# Patient Record
Sex: Male | Born: 1940 | Race: White | Hispanic: No
Health system: Midwestern US, Community
[De-identification: ages and names within clinical notes are randomized; demographics above are authoritative.]

## PROBLEM LIST (undated history)

## (undated) DIAGNOSIS — D649 Anemia, unspecified: Secondary | ICD-10-CM

## (undated) DIAGNOSIS — N521 Erectile dysfunction due to diseases classified elsewhere: Secondary | ICD-10-CM

## (undated) DIAGNOSIS — E785 Hyperlipidemia, unspecified: Principal | ICD-10-CM

## (undated) DIAGNOSIS — E291 Testicular hypofunction: Secondary | ICD-10-CM

## (undated) DIAGNOSIS — C61 Malignant neoplasm of prostate: Principal | ICD-10-CM

## (undated) DIAGNOSIS — N401 Enlarged prostate with lower urinary tract symptoms: Secondary | ICD-10-CM

## (undated) DIAGNOSIS — N138 Other obstructive and reflux uropathy: Secondary | ICD-10-CM

## (undated) DIAGNOSIS — I10 Essential (primary) hypertension: Secondary | ICD-10-CM

## (undated) DIAGNOSIS — N644 Mastodynia: Secondary | ICD-10-CM

## (undated) DIAGNOSIS — E538 Deficiency of other specified B group vitamins: Principal | ICD-10-CM

## (undated) DIAGNOSIS — D509 Iron deficiency anemia, unspecified: Principal | ICD-10-CM

## (undated) DIAGNOSIS — E7849 Other hyperlipidemia: Secondary | ICD-10-CM

## (undated) DIAGNOSIS — R7989 Other specified abnormal findings of blood chemistry: Secondary | ICD-10-CM

## (undated) DIAGNOSIS — L989 Disorder of the skin and subcutaneous tissue, unspecified: Principal | ICD-10-CM

## (undated) DIAGNOSIS — D696 Thrombocytopenia, unspecified: Secondary | ICD-10-CM

## (undated) DIAGNOSIS — R9430 Abnormal result of cardiovascular function study, unspecified: Secondary | ICD-10-CM

## (undated) DIAGNOSIS — N529 Male erectile dysfunction, unspecified: Secondary | ICD-10-CM

## (undated) DIAGNOSIS — E7800 Pure hypercholesterolemia, unspecified: Secondary | ICD-10-CM

## (undated) DIAGNOSIS — I493 Ventricular premature depolarization: Secondary | ICD-10-CM

## (undated) HISTORY — PX: BIOPSY PROSTATE: PRO28

## (undated) HISTORY — PX: CHOLECYSTECTOMY: SHX55

## (undated) HISTORY — DX: Malignant neoplasm of prostate: C61

## (undated) HISTORY — PX: ABDOMINAL SURGERY: SHX537

## (undated) HISTORY — PX: APPENDECTOMY: SHX54

## (undated) HISTORY — DX: Ventricular premature depolarization: I49.3

---

## 2014-08-07 DIAGNOSIS — Z23 Encounter for immunization: Secondary | ICD-10-CM | POA: Diagnosis not present

## 2014-08-14 DIAGNOSIS — E785 Hyperlipidemia, unspecified: Secondary | ICD-10-CM | POA: Diagnosis not present

## 2014-08-14 DIAGNOSIS — I493 Ventricular premature depolarization: Secondary | ICD-10-CM | POA: Diagnosis not present

## 2014-08-14 DIAGNOSIS — R9439 Abnormal result of other cardiovascular function study: Secondary | ICD-10-CM | POA: Diagnosis not present

## 2014-08-22 DIAGNOSIS — R9439 Abnormal result of other cardiovascular function study: Secondary | ICD-10-CM | POA: Diagnosis not present

## 2014-08-22 DIAGNOSIS — I493 Ventricular premature depolarization: Secondary | ICD-10-CM | POA: Diagnosis not present

## 2014-08-22 DIAGNOSIS — E785 Hyperlipidemia, unspecified: Secondary | ICD-10-CM | POA: Diagnosis not present

## 2014-09-24 DIAGNOSIS — J0140 Acute pansinusitis, unspecified: Secondary | ICD-10-CM | POA: Diagnosis not present

## 2014-10-10 DIAGNOSIS — Z125 Encounter for screening for malignant neoplasm of prostate: Secondary | ICD-10-CM | POA: Diagnosis not present

## 2014-10-10 DIAGNOSIS — N529 Male erectile dysfunction, unspecified: Secondary | ICD-10-CM | POA: Diagnosis not present

## 2014-10-10 DIAGNOSIS — N528 Other male erectile dysfunction: Secondary | ICD-10-CM | POA: Diagnosis not present

## 2014-10-10 DIAGNOSIS — N401 Enlarged prostate with lower urinary tract symptoms: Secondary | ICD-10-CM | POA: Diagnosis not present

## 2015-02-10 DIAGNOSIS — I493 Ventricular premature depolarization: Secondary | ICD-10-CM | POA: Diagnosis not present

## 2015-02-10 DIAGNOSIS — E785 Hyperlipidemia, unspecified: Secondary | ICD-10-CM | POA: Diagnosis not present

## 2015-02-10 DIAGNOSIS — R9439 Abnormal result of other cardiovascular function study: Secondary | ICD-10-CM | POA: Diagnosis not present

## 2015-03-03 DIAGNOSIS — D225 Melanocytic nevi of trunk: Secondary | ICD-10-CM | POA: Diagnosis not present

## 2015-03-03 DIAGNOSIS — L570 Actinic keratosis: Secondary | ICD-10-CM | POA: Diagnosis not present

## 2015-03-03 DIAGNOSIS — D2261 Melanocytic nevi of right upper limb, including shoulder: Secondary | ICD-10-CM | POA: Diagnosis not present

## 2015-04-13 DIAGNOSIS — J320 Chronic maxillary sinusitis: Secondary | ICD-10-CM | POA: Diagnosis not present

## 2015-04-13 DIAGNOSIS — J321 Chronic frontal sinusitis: Secondary | ICD-10-CM | POA: Diagnosis not present

## 2015-05-29 DIAGNOSIS — E785 Hyperlipidemia, unspecified: Secondary | ICD-10-CM | POA: Diagnosis not present

## 2015-05-29 DIAGNOSIS — I493 Ventricular premature depolarization: Secondary | ICD-10-CM | POA: Diagnosis not present

## 2015-08-13 DIAGNOSIS — Z23 Encounter for immunization: Secondary | ICD-10-CM | POA: Diagnosis not present

## 2015-09-03 DIAGNOSIS — D235 Other benign neoplasm of skin of trunk: Secondary | ICD-10-CM | POA: Diagnosis not present

## 2015-09-03 DIAGNOSIS — D485 Neoplasm of uncertain behavior of skin: Secondary | ICD-10-CM | POA: Diagnosis not present

## 2015-09-22 DIAGNOSIS — L821 Other seborrheic keratosis: Secondary | ICD-10-CM | POA: Diagnosis not present

## 2015-09-22 DIAGNOSIS — D2311 Other benign neoplasm of skin of right eyelid, including canthus: Secondary | ICD-10-CM | POA: Diagnosis not present

## 2015-09-22 DIAGNOSIS — Z85828 Personal history of other malignant neoplasm of skin: Secondary | ICD-10-CM | POA: Diagnosis not present

## 2015-09-22 DIAGNOSIS — L814 Other melanin hyperpigmentation: Secondary | ICD-10-CM | POA: Diagnosis not present

## 2015-09-22 DIAGNOSIS — D485 Neoplasm of uncertain behavior of skin: Secondary | ICD-10-CM | POA: Diagnosis not present

## 2015-09-23 DIAGNOSIS — R972 Elevated prostate specific antigen [PSA]: Secondary | ICD-10-CM | POA: Diagnosis not present

## 2015-09-23 DIAGNOSIS — R3916 Straining to void: Secondary | ICD-10-CM | POA: Diagnosis not present

## 2015-09-23 DIAGNOSIS — N401 Enlarged prostate with lower urinary tract symptoms: Secondary | ICD-10-CM | POA: Diagnosis not present

## 2015-09-23 DIAGNOSIS — N402 Nodular prostate without lower urinary tract symptoms: Secondary | ICD-10-CM | POA: Diagnosis not present

## 2015-09-24 LAB — PSA PROSTATIC SPECIFIC ANTIGEN: PSA: 5.04 ng/mL

## 2015-11-11 DIAGNOSIS — N402 Nodular prostate without lower urinary tract symptoms: Secondary | ICD-10-CM | POA: Diagnosis not present

## 2015-11-11 DIAGNOSIS — R972 Elevated prostate specific antigen [PSA]: Secondary | ICD-10-CM | POA: Diagnosis not present

## 2015-11-27 DIAGNOSIS — H04123 Dry eye syndrome of bilateral lacrimal glands: Secondary | ICD-10-CM | POA: Diagnosis not present

## 2016-01-05 DIAGNOSIS — S86912A Strain of unspecified muscle(s) and tendon(s) at lower leg level, left leg, initial encounter: Secondary | ICD-10-CM | POA: Diagnosis not present

## 2016-01-05 DIAGNOSIS — M179 Osteoarthritis of knee, unspecified: Secondary | ICD-10-CM | POA: Diagnosis not present

## 2016-02-15 ENCOUNTER — Encounter: Payer: Self-pay | Admitting: Cardiology

## 2016-02-15 ENCOUNTER — Ambulatory Visit (INDEPENDENT_AMBULATORY_CARE_PROVIDER_SITE_OTHER): Payer: Medicare Other | Admitting: Cardiology

## 2016-02-15 VITALS — BP 130/78 | HR 64 | Ht 68.0 in | Wt 176.1 lb

## 2016-02-15 DIAGNOSIS — E785 Hyperlipidemia, unspecified: Secondary | ICD-10-CM

## 2016-02-15 DIAGNOSIS — R079 Chest pain, unspecified: Secondary | ICD-10-CM | POA: Diagnosis not present

## 2016-02-15 LAB — CBC WITH DIFFERENTIAL/PLATELET
BASOS ABS: 63 {cells}/uL (ref 0–200)
Basophils Relative: 1 %
EOS PCT: 3 %
Eosinophils Absolute: 189 cells/uL (ref 15–500)
HEMATOCRIT: 42 % (ref 38.5–50.0)
HEMOGLOBIN: 14.1 g/dL (ref 13.2–17.1)
LYMPHS ABS: 2016 {cells}/uL (ref 850–3900)
LYMPHS PCT: 32 %
MCH: 28.8 pg (ref 27.0–33.0)
MCHC: 33.6 g/dL (ref 32.0–36.0)
MCV: 85.7 fL (ref 80.0–100.0)
MONO ABS: 630 {cells}/uL (ref 200–950)
MPV: 9.5 fL (ref 7.5–12.5)
Monocytes Relative: 10 %
NEUTROS PCT: 54 %
Neutro Abs: 3402 cells/uL (ref 1500–7800)
Platelets: 163 10*3/uL (ref 140–400)
RBC: 4.9 MIL/uL (ref 4.20–5.80)
RDW: 14.2 % (ref 11.0–15.0)
WBC: 6.3 10*3/uL (ref 3.8–10.8)

## 2016-02-15 LAB — LIPID PANEL
CHOL/HDL RATIO: 2.2 ratio (ref <=5.0)
Cholesterol: 130 mg/dL (ref 125–200)
HDL: 59 mg/dL (ref >=40)
LDL CALC: 62 mg/dL (ref <130)
TRIGLYCERIDES: 43 mg/dL (ref <150)
VLDL: 9 mg/dL (ref <30)

## 2016-02-15 LAB — COMPREHENSIVE METABOLIC PANEL
ALBUMIN: 4.2 g/dL (ref 3.6–5.1)
ALT: 16 U/L (ref 9–46)
AST: 20 U/L (ref 10–35)
Alkaline Phosphatase: 92 U/L (ref 40–115)
BUN: 21 mg/dL (ref 7–25)
CALCIUM: 9.1 mg/dL (ref 8.6–10.3)
CHLORIDE: 105 mmol/L (ref 98–110)
CO2: 26 mmol/L (ref 20–31)
Creat: 0.92 mg/dL (ref 0.70–1.18)
GLUCOSE: 76 mg/dL (ref 65–99)
Potassium: 4.2 mmol/L (ref 3.5–5.3)
Sodium: 141 mmol/L (ref 135–146)
Total Bilirubin: 0.6 mg/dL (ref 0.2–1.2)
Total Protein: 6.2 g/dL (ref 6.1–8.1)

## 2016-02-15 MED ORDER — ASPIRIN EC 81 MG PO TBEC
81.0000 mg | DELAYED_RELEASE_TABLET | Freq: Every day | ORAL | Status: AC
Start: 2016-02-15 — End: ?

## 2016-02-15 MED ORDER — ATORVASTATIN CALCIUM 10 MG PO TABS: 10.0000 mg | ORAL_TABLET | Freq: Every day | ORAL | Status: DC

## 2016-02-15 NOTE — Patient Instructions (Signed)
Medication Instructions:  Your physician recommends that you continue on your current medications as directed. Please refer to the Current Medication list given to you today.   Labwork: Lipid profile/CMET/CBCd today  Testing/Procedures: None   Follow-Up: Your physician wants you to follow-up in: 1 year with Dr Aundra Dubin. (May 2018). You will receive a reminder letter in the mail two months in advance. If you don't receive a letter, please call our office to schedule the follow-up appointment.        If you need a refill on your cardiac medications before your next appointment, please call your pharmacy.

## 2016-02-16 DIAGNOSIS — E785 Hyperlipidemia, unspecified: Secondary | ICD-10-CM | POA: Insufficient documentation

## 2016-02-16 DIAGNOSIS — R079 Chest pain, unspecified: Secondary | ICD-10-CM | POA: Insufficient documentation

## 2016-02-16 NOTE — Progress Notes (Signed)
Patient ID: Antonio Chambers, male   DOB: 1940-11-18, 75 y.o.   MRN: [id number]  75 yo with history of hyperlipidemia and prior abnormal ETTs presents for cardiology evaluation.  Patient has no history of MI.  He has had chest pain in the past.  ETTs have been done, showing ST depression.  However, Cardiolites have shown no ischemia or infarction so ETTs thought to be false positive.  He has been doing well in general.  No chest pain.  He can walk 2 miles for exercise with no dyspnea.  No palpitations or lightheadedness.    Labs (5/16): creatinine 0.81, LDL 66  ECG: NSR, normal  PMH: 1. Hyperlipidemia 2. PVCs 3. BPH 4. Cholecystectomy 5. Appendectomy 6. Chest pain: ETT-Cardiolite in 2/13 with ST depression but no ischemia or infarction on perfusion images, thought to be normal.  EF 74%.    SH: Prior smoker.  Occasional ETOH.  Married, lives in [place].  Moved here from [facility].  Retired Therapist, art.   FH: Father with CVA, mother with Alzheimers.   ROS: All systems reviewed and negative except as per HPI.   Current Outpatient Prescriptions  Medication Sig Dispense Refill  . atorvastatin (LIPITOR) 10 MG tablet Take 1 tablet (10 mg total) by mouth daily. 90 tablet 3  . CIALIS 20 MG tablet Take 20 mg by mouth as needed. For ED  11  . tamsulosin (FLOMAX) 0.4 MG CAPS capsule Take 0.4 mg by mouth daily. Take 2 tablets (0.8mg ) daily    . aspirin EC 81 MG tablet Take 1 tablet (81 mg total) by mouth daily.     No current facility-administered medications for this visit.   BP 130/78 mmHg  Pulse 64  Ht 5\' 8"  (1.727 m)  Wt 176 lb 1.9 oz (79.888 kg)  BMI 26.79 kg/m2 General: NAD Neck: No JVD, no thyromegaly or thyroid nodule.  Lungs: Clear to auscultation bilaterally with normal respiratory effort. CV: Nondisplaced PMI.  Heart regular S1/S2, no S3/S4, no murmur.  No peripheral edema.  No carotid bruit.  Normal pedal pulses.  Abdomen: Soft, nontender, no  hepatosplenomegaly, no distention.  Skin: Intact without lesions or rashes.  Neurologic: Alert and oriented x 3.  Psych: Normal affect. Extremities: No clubbing or cyanosis.  HEENT: Normal.   Assessment/Plan: 1. Hyperlipidemia: He is on atorvastatin, check lipids today.  2. Chest pain: No recent chest pain.  Had prior suspected false positive ETT with normal Cardiolite. Continue ASA 81 daily.   Will check CMET and CBC today.   Loralie Champagne 02/16/2016

## 2016-02-17 ENCOUNTER — Telehealth: Payer: Self-pay | Admitting: Cardiology

## 2016-02-17 NOTE — Telephone Encounter (Signed)
Follow-up      The pt was returning phone about lab work results, but the pt is at work til 6 pm. Please wait if someone could call the pt back after 6 pm.

## 2016-02-17 NOTE — Telephone Encounter (Signed)
Left message for pt to c/b tomorrow for his results or sign onto MyChart to review.

## 2016-02-18 ENCOUNTER — Telehealth: Payer: Self-pay | Admitting: Cardiology

## 2016-02-18 NOTE — Telephone Encounter (Signed)
Informed pt of lab results. Pt verbalized understanding. 

## 2016-02-18 NOTE — Telephone Encounter (Signed)
Follow Up: ° ° ° °Returning Pam's call from yesterday. °

## 2016-02-22 ENCOUNTER — Encounter: Payer: Self-pay | Admitting: Cardiology

## 2016-03-22 DIAGNOSIS — D1801 Hemangioma of skin and subcutaneous tissue: Secondary | ICD-10-CM | POA: Diagnosis not present

## 2016-03-22 DIAGNOSIS — L720 Epidermal cyst: Secondary | ICD-10-CM | POA: Diagnosis not present

## 2016-03-22 DIAGNOSIS — L821 Other seborrheic keratosis: Secondary | ICD-10-CM | POA: Diagnosis not present

## 2016-03-22 DIAGNOSIS — L814 Other melanin hyperpigmentation: Secondary | ICD-10-CM | POA: Diagnosis not present

## 2016-03-22 DIAGNOSIS — L570 Actinic keratosis: Secondary | ICD-10-CM | POA: Diagnosis not present

## 2016-03-30 DIAGNOSIS — R972 Elevated prostate specific antigen [PSA]: Secondary | ICD-10-CM | POA: Diagnosis not present

## 2016-03-30 LAB — PSA PROSTATIC SPECIFIC ANTIGEN: PSA: 3.95 ng/mL

## 2016-04-26 DIAGNOSIS — M1712 Unilateral primary osteoarthritis, left knee: Secondary | ICD-10-CM | POA: Diagnosis not present

## 2016-04-26 DIAGNOSIS — M7582 Other shoulder lesions, left shoulder: Secondary | ICD-10-CM | POA: Diagnosis not present

## 2016-05-05 DIAGNOSIS — N402 Nodular prostate without lower urinary tract symptoms: Secondary | ICD-10-CM | POA: Diagnosis not present

## 2016-05-05 DIAGNOSIS — R972 Elevated prostate specific antigen [PSA]: Secondary | ICD-10-CM | POA: Diagnosis not present

## 2016-05-24 DIAGNOSIS — M1712 Unilateral primary osteoarthritis, left knee: Secondary | ICD-10-CM | POA: Diagnosis not present

## 2016-07-17 DIAGNOSIS — R21 Rash and other nonspecific skin eruption: Secondary | ICD-10-CM | POA: Diagnosis not present

## 2016-08-05 DIAGNOSIS — Z23 Encounter for immunization: Secondary | ICD-10-CM | POA: Diagnosis not present

## 2016-09-21 DIAGNOSIS — L720 Epidermal cyst: Secondary | ICD-10-CM | POA: Diagnosis not present

## 2016-09-21 DIAGNOSIS — L821 Other seborrheic keratosis: Secondary | ICD-10-CM | POA: Diagnosis not present

## 2016-09-21 DIAGNOSIS — L814 Other melanin hyperpigmentation: Secondary | ICD-10-CM | POA: Diagnosis not present

## 2016-09-21 DIAGNOSIS — D1801 Hemangioma of skin and subcutaneous tissue: Secondary | ICD-10-CM | POA: Diagnosis not present

## 2016-09-21 DIAGNOSIS — Z85828 Personal history of other malignant neoplasm of skin: Secondary | ICD-10-CM | POA: Diagnosis not present

## 2016-11-02 DIAGNOSIS — R972 Elevated prostate specific antigen [PSA]: Secondary | ICD-10-CM | POA: Diagnosis not present

## 2016-11-02 LAB — PSA PROSTATIC SPECIFIC ANTIGEN: PSA: 4.19 ng/mL

## 2016-11-09 DIAGNOSIS — N401 Enlarged prostate with lower urinary tract symptoms: Secondary | ICD-10-CM | POA: Diagnosis not present

## 2016-11-09 DIAGNOSIS — N5201 Erectile dysfunction due to arterial insufficiency: Secondary | ICD-10-CM | POA: Diagnosis not present

## 2016-11-09 DIAGNOSIS — R3912 Poor urinary stream: Secondary | ICD-10-CM | POA: Diagnosis not present

## 2016-11-09 DIAGNOSIS — N402 Nodular prostate without lower urinary tract symptoms: Secondary | ICD-10-CM | POA: Diagnosis not present

## 2016-11-09 DIAGNOSIS — R972 Elevated prostate specific antigen [PSA]: Secondary | ICD-10-CM | POA: Diagnosis not present

## 2016-11-28 ENCOUNTER — Other Ambulatory Visit: Payer: Self-pay | Admitting: Urology

## 2016-11-28 DIAGNOSIS — N402 Nodular prostate without lower urinary tract symptoms: Secondary | ICD-10-CM

## 2016-12-09 ENCOUNTER — Ambulatory Visit
Admission: RE | Admit: 2016-12-09 | Discharge: 2016-12-09 | Disposition: A | Discharge status: Home / Self Care | Payer: Medicare Other | Source: Ambulatory Visit | Attending: Urology | Admitting: Urology

## 2016-12-09 DIAGNOSIS — R972 Elevated prostate specific antigen [PSA]: Secondary | ICD-10-CM | POA: Diagnosis not present

## 2016-12-09 DIAGNOSIS — N402 Nodular prostate without lower urinary tract symptoms: Secondary | ICD-10-CM

## 2016-12-09 MED ORDER — GADOBENATE DIMEGLUMINE 529 MG/ML IV SOLN
15.0000 mL | Freq: Once | INTRAVENOUS | Status: AC | PRN
  Administered 2016-12-09: 15 mL via INTRAVENOUS

## 2017-01-20 DIAGNOSIS — C61 Malignant neoplasm of prostate: Secondary | ICD-10-CM | POA: Diagnosis not present

## 2017-01-20 DIAGNOSIS — R972 Elevated prostate specific antigen [PSA]: Secondary | ICD-10-CM | POA: Diagnosis not present

## 2017-01-25 ENCOUNTER — Other Ambulatory Visit: Payer: Self-pay | Admitting: Cardiology

## 2017-01-30 DIAGNOSIS — C61 Malignant neoplasm of prostate: Secondary | ICD-10-CM | POA: Diagnosis not present

## 2017-02-05 ENCOUNTER — Encounter (HOSPITAL_COMMUNITY): Payer: Self-pay | Admitting: Emergency Medicine

## 2017-02-05 ENCOUNTER — Emergency Department (HOSPITAL_COMMUNITY): Payer: Medicare Other

## 2017-02-05 ENCOUNTER — Emergency Department (HOSPITAL_COMMUNITY)
Admission: EM | Admit: 2017-02-05 | Discharge: 2017-02-06 | Disposition: A | Discharge status: Home / Self Care | Payer: Medicare Other | Attending: Physician Assistant | Admitting: Physician Assistant

## 2017-02-05 DIAGNOSIS — Z5181 Encounter for therapeutic drug level monitoring: Secondary | ICD-10-CM | POA: Diagnosis not present

## 2017-02-05 DIAGNOSIS — R42 Dizziness and giddiness: Secondary | ICD-10-CM | POA: Diagnosis not present

## 2017-02-05 DIAGNOSIS — Z87891 Personal history of nicotine dependence: Secondary | ICD-10-CM | POA: Diagnosis not present

## 2017-02-05 DIAGNOSIS — H8110 Benign paroxysmal vertigo, unspecified ear: Secondary | ICD-10-CM | POA: Diagnosis not present

## 2017-02-05 DIAGNOSIS — R51 Headache: Secondary | ICD-10-CM | POA: Diagnosis not present

## 2017-02-05 DIAGNOSIS — H8113 Benign paroxysmal vertigo, bilateral: Secondary | ICD-10-CM

## 2017-02-05 DIAGNOSIS — Z79899 Other long term (current) drug therapy: Secondary | ICD-10-CM | POA: Diagnosis not present

## 2017-02-05 DIAGNOSIS — Z7982 Long term (current) use of aspirin: Secondary | ICD-10-CM | POA: Insufficient documentation

## 2017-02-05 DIAGNOSIS — R404 Transient alteration of awareness: Secondary | ICD-10-CM | POA: Diagnosis not present

## 2017-02-05 HISTORY — DX: Pure hypercholesterolemia, unspecified: E78.00

## 2017-02-05 NOTE — ED Triage Notes (Signed)
Brought by ems from home.  c/o dizziness and n/v that started suddenly.  Reports feeling okay around 2200.  Got up to go to the bathroom at 2230 and was dizzy and started vomiting.  Also reporting headache.  No hx of vertigo.  Recent biopsy of prostate on April 20th.  Still having blood clots in urine at times.  Given zofran 4mg  IV en route with some relief of nausea.

## 2017-02-05 NOTE — ED Provider Notes (Signed)
[name] DEPT Provider Note   CSN: [id number] Arrival date & time: 02/05/17  2322  By signing my name below, I, Antonio Chambers, attest that this documentation has been prepared under the direction and in the presence of Antonio Chambers, Fredia Sorrow, MD. Electronically Signed: Hansel Chambers, ED Scribe. 02/05/17. 11:39 PM.     History   Chief Complaint Chief Complaint  Patient presents with  . Dizziness    HPI Antonio Chambers is a 76 y.o. male who presents to the Emergency Department complaining of sudden onset dizziness that began at 10 PM upon moving from a lying to standing position. Per pt, his symptoms began when he got up to walk to the bathroom and were accompanied by nausea and HA. He reports his dizziness was temporarily relieved after sitting down but recurred upon standing up. He states he is still dizzy and nauseated, but his symptoms are not as severe. Pt reports his dizziness is not significantly worsened with neck rotation, but is worsened with sudden movements. No h/o peripheral vertigo. Pt takes 81 mg ASA daily, but no other blood thinners. He denies recent upper respiratory illnesses and additional complaints.    The history is provided by the patient. No language interpreter was used.    Past Medical History:  Diagnosis Date  . High cholesterol     Patient Active Problem List   Diagnosis Date Noted  . Hyperlipidemia 02/16/2016  . Chest pain 02/16/2016    Past Surgical History:  Procedure Laterality Date  . ABDOMINAL SURGERY    . APPENDECTOMY    . BIOPSY PROSTATE    . CHOLECYSTECTOMY         Home Medications    Prior to Admission medications   Medication Sig Start Date End Date Taking? Authorizing Provider  aspirin EC 81 MG tablet Take 1 tablet (81 mg total) by mouth daily. 02/15/16  Yes Larey Dresser, MD  atorvastatin (LIPITOR) 10 MG tablet TAKE 1 TABLET EVERY DAY 01/25/17  Yes Larey Dresser, MD  CIALIS 20 MG tablet Take 20 mg by mouth daily as needed  for erectile dysfunction. For ED 01/23/16  Yes [provider]  tamsulosin (FLOMAX) 0.4 MG CAPS capsule Take 0.8 mg by mouth daily.  11/08/15  Yes [provider]    Family History Family History  Problem Relation Age of Onset  . Stroke Father     Social History Social History  Substance Use Topics  . Smoking status: Former Research scientist (life sciences)  . Smokeless tobacco: Never Used  . Alcohol use 0.0 oz/week     Allergies   Codeine   Review of Systems Review of Systems  Gastrointestinal: Positive for nausea.  Neurological: Positive for dizziness and headaches.  All other systems reviewed and are negative.    Physical Exam Updated Vital Signs BP 129/64   Pulse (!) 56   Temp 97.6 F (36.4 C)   Resp 19   Ht 5\' 7"  (1.702 m)   Wt 170 lb (77.1 kg)   SpO2 97%   BMI 26.63 kg/m   Physical Exam  Constitutional: He appears well-developed and well-nourished.  HENT:  Head: Normocephalic.  Right Ear: Tympanic membrane normal.  Left Ear: Tympanic membrane normal.  Eyes: Conjunctivae are normal.  Nystagmus when looking to the right.   Cardiovascular: Normal rate, regular rhythm and normal heart sounds.   Pulmonary/Chest: Effort normal and breath sounds normal. No respiratory distress. He has no wheezes. He has no rales.  Abdominal: He exhibits no distension.  Musculoskeletal: Normal range of motion. He exhibits edema.  Trace edema to BLE  Neurological: He is alert.  Negative romberg. Inducible dizziness upon sitting up. Cranial nerves 2-12 grossly intact.   Skin: Skin is warm and dry.  Psychiatric: He has a normal mood and affect. His behavior is normal.  Nursing note and vitals reviewed.    ED Treatments / Results   DIAGNOSTIC STUDIES: Oxygen Saturation is 98% on RA, normal by my interpretation.    COORDINATION OF CARE: 11:38 PM Discussed treatment plan with pt at bedside which includes CT and pt agreed to plan.    Labs (all labs ordered are listed, but only  abnormal results are displayed) Labs Reviewed  PROTIME-INR - Abnormal; Notable for the following:       Result Value   Prothrombin Time 15.3 (*)    All other components within normal limits  CBC - Abnormal; Notable for the following:    Hemoglobin 12.9 (*)    All other components within normal limits  COMPREHENSIVE METABOLIC PANEL - Abnormal; Notable for the following:    Glucose, Bld 117 (*)    BUN 21 (*)    Calcium 8.7 (*)    Total Protein 5.7 (*)    All other components within normal limits  URINALYSIS, ROUTINE W REFLEX MICROSCOPIC - Abnormal; Notable for the following:    Hgb urine dipstick LARGE (*)    Bacteria, UA RARE (*)    All other components within normal limits  ETHANOL  APTT  DIFFERENTIAL  RAPID URINE DRUG SCREEN, HOSP PERFORMED  I-STAT TROPOININ, ED    EKG  EKG Interpretation  Date/Time:  Sunday Feb 05 2017 23:32:49 EDT Ventricular Rate:  54 PR Interval:    QRS Duration: 103 QT Interval:  449 QTC Calculation: 426 R Axis:   40 Text Interpretation:  Sinus rhythm Normal sinus rhythm Confirmed by Thomasene Lot, COURTNEY ([id number]) on 02/05/2017 11:59:21 PM       Radiology Ct Head Wo Contrast  Result Date: 02/05/2017 CLINICAL DATA:  Dizziness, nausea and vomiting beginning at 2230 hours. Headache. EXAM: CT HEAD WITHOUT CONTRAST TECHNIQUE: Contiguous axial images were obtained from the base of the skull through the vertex without intravenous contrast. COMPARISON:  None. FINDINGS: BRAIN: No intraparenchymal hemorrhage, mass effect nor midline shift. The ventricles and sulci are normal for age. No acute large vascular territory infarcts. No abnormal extra-axial fluid collections. Basal cisterns are patent. VASCULAR: Trace calcific atherosclerosis of the carotid siphons. SKULL: No skull fracture. No significant scalp soft tissue swelling. SINUSES/ORBITS: Mild paranasal sinus mucosal thickening. Mastoid air cells are well aerated. The included ocular globes and orbital contents  are non-suspicious. OTHER: None. IMPRESSION: Normal CT HEAD for age. Electronically Signed   By: Elon Alas M.D.   On: 02/05/2017 23:57   Mr Brain Wo Contrast  Result Date: 02/06/2017 CLINICAL DATA:  Acute onset dizziness beginning at 10 p.m. yesterday. Assess for posterior stroke. History of hyperlipidemia. EXAM: MRI HEAD WITHOUT CONTRAST TECHNIQUE: Multiplanar, multiecho pulse sequences of the brain and surrounding structures were obtained without intravenous contrast. COMPARISON:  CT HEAD Feb 05, 2017 FINDINGS: BRAIN: No reduced diffusion to suggest acute ischemia. No susceptibility artifact to suggest hemorrhage. The ventricles and sulci are normal for patient's age. No suspicious parenchymal signal, masses or mass effect. No abnormal extra-axial fluid collections. VASCULAR: Normal major intracranial vascular flow voids present at skull base. SKULL AND UPPER CERVICAL SPINE: No abnormal sellar expansion. No suspicious calvarial bone marrow signal. Craniocervical junction maintained. SINUSES/ORBITS:  RIGHT sphenoid sinus mucosal thickening. Mastoid air cells are well aerated. The included ocular globes and orbital contents are non-suspicious. OTHER: None. IMPRESSION: Normal noncontrast MRI head. Electronically Signed   By: Elon Alas M.D.   On: 02/06/2017 04:10    Procedures Procedures (including critical care time)  Medications Ordered in ED Medications  meclizine (ANTIVERT) tablet 12.5 mg (12.5 mg Oral Given 02/06/17 0055)     Initial Impression / Assessment and Plan / ED Course  I have reviewed the triage vital signs and the nursing notes.  Pertinent labs & imaging results that were available during my care of the patient were reviewed by me and considered in my medical decision making (see chart for details).     I personally performed the services described in this documentation, which was scribed in my presence. The recorded information has been reviewed and is accurate.    Patient is 76 year old male presenting with dizziness. Patient had sudden onset of dizziness in the middle night tonight. He got out of bed which worsened it. Patient's sitting and standing brings on the dizziness however he does have dizziness even when not moving. Given this I'm concerned with the persistent dizziness that it could be central in origin. We'll get MRI. IF MRI negative plan to discharge home with meclizine  5:47 AM MRI negative. Pt ambulated without issue. Suspect BBPV.  Will give follow up and discussed vestibular rehab.   Final Clinical Impressions(s) / ED Diagnoses   Final diagnoses:  None    New Prescriptions New Prescriptions   No medications on file      Macarthur Critchley, MD 02/06/17 [phone]

## 2017-02-06 ENCOUNTER — Emergency Department (HOSPITAL_COMMUNITY): Payer: Medicare Other

## 2017-02-06 DIAGNOSIS — H8110 Benign paroxysmal vertigo, unspecified ear: Secondary | ICD-10-CM | POA: Diagnosis not present

## 2017-02-06 DIAGNOSIS — R42 Dizziness and giddiness: Secondary | ICD-10-CM | POA: Diagnosis not present

## 2017-02-06 LAB — COMPREHENSIVE METABOLIC PANEL
ALBUMIN: 3.6 g/dL (ref 3.5–5.0)
ALT: 18 U/L (ref 17–63)
AST: 20 U/L (ref 15–41)
Alkaline Phosphatase: 84 U/L (ref 38–126)
Anion gap: 7 (ref 5–15)
BILIRUBIN TOTAL: 0.7 mg/dL (ref 0.3–1.2)
BUN: 21 mg/dL — AB (ref 6–20)
CHLORIDE: 107 mmol/L (ref 101–111)
CO2: 24 mmol/L (ref 22–32)
Calcium: 8.7 mg/dL — ABNORMAL LOW (ref 8.9–10.3)
Creatinine, Ser: 0.88 mg/dL (ref 0.61–1.24)
GFR calc Af Amer: >60 mL/min (ref >60)
GFR calc non Af Amer: >60 mL/min (ref >60)
GLUCOSE: 117 mg/dL — AB (ref 65–99)
POTASSIUM: 3.7 mmol/L (ref 3.5–5.1)
Sodium: 138 mmol/L (ref 135–145)
TOTAL PROTEIN: 5.7 g/dL — AB (ref 6.5–8.1)

## 2017-02-06 LAB — RAPID URINE DRUG SCREEN, HOSP PERFORMED
Amphetamines: NOT DETECTED
BARBITURATES: NOT DETECTED
BENZODIAZEPINES: NOT DETECTED
COCAINE: NOT DETECTED
Opiates: NOT DETECTED
TETRAHYDROCANNABINOL: NOT DETECTED

## 2017-02-06 LAB — URINALYSIS, ROUTINE W REFLEX MICROSCOPIC
Bilirubin Urine: NEGATIVE
Glucose, UA: NEGATIVE mg/dL
Ketones, ur: NEGATIVE mg/dL
Leukocytes, UA: NEGATIVE
Nitrite: NEGATIVE
PH: 5 (ref 5.0–8.0)
Protein, ur: NEGATIVE mg/dL
SQUAMOUS EPITHELIAL / LPF: NONE SEEN
Specific Gravity, Urine: 1.009 (ref 1.005–1.030)
WBC, UA: NONE SEEN WBC/hpf (ref 0–5)

## 2017-02-06 LAB — CBC
HEMATOCRIT: 39 % (ref 39.0–52.0)
HEMOGLOBIN: 12.9 g/dL — AB (ref 13.0–17.0)
MCH: 28.4 pg (ref 26.0–34.0)
MCHC: 33.1 g/dL (ref 30.0–36.0)
MCV: 85.9 fL (ref 78.0–100.0)
Platelets: 153 10*3/uL (ref 150–400)
RBC: 4.54 MIL/uL (ref 4.22–5.81)
RDW: 13.7 % (ref 11.5–15.5)
WBC: 7.1 10*3/uL (ref 4.0–10.5)

## 2017-02-06 LAB — I-STAT TROPONIN, ED: Troponin i, poc: 0.01 ng/mL (ref 0.00–0.08)

## 2017-02-06 LAB — DIFFERENTIAL
BASOS ABS: 0 10*3/uL (ref 0.0–0.1)
Basophils Relative: 1 %
Eosinophils Absolute: 0.2 10*3/uL (ref 0.0–0.7)
Eosinophils Relative: 2 %
LYMPHS ABS: 1.4 10*3/uL (ref 0.7–4.0)
Lymphocytes Relative: 20 %
MONO ABS: 0.7 10*3/uL (ref 0.1–1.0)
MONOS PCT: 10 %
Neutro Abs: 4.9 10*3/uL (ref 1.7–7.7)
Neutrophils Relative %: 67 %

## 2017-02-06 LAB — APTT: aPTT: 33 seconds (ref 24–36)

## 2017-02-06 LAB — ETHANOL: Alcohol, Ethyl (B): <5 mg/dL (ref <5)

## 2017-02-06 LAB — PROTIME-INR
INR: 1.2
Prothrombin Time: 15.3 seconds — ABNORMAL HIGH (ref 11.4–15.2)

## 2017-02-06 MED ORDER — MECLIZINE HCL 25 MG PO TABS
12.5000 mg | ORAL_TABLET | Freq: Once | ORAL | Status: AC
  Administered 2017-02-06: 12.5 mg via ORAL
  Filled 2017-02-06: qty 1

## 2017-02-06 MED ORDER — MECLIZINE HCL 12.5 MG PO TABS: 12.5000 mg | ORAL_TABLET | Freq: Three times a day (TID) | ORAL | 0 refills | Status: AC | PRN

## 2017-02-17 ENCOUNTER — Other Ambulatory Visit: Payer: Self-pay | Admitting: Cardiology

## 2017-03-06 ENCOUNTER — Other Ambulatory Visit: Payer: Self-pay | Admitting: Cardiology

## 2017-03-13 DIAGNOSIS — H9113 Presbycusis, bilateral: Secondary | ICD-10-CM | POA: Diagnosis not present

## 2017-03-13 DIAGNOSIS — R42 Dizziness and giddiness: Secondary | ICD-10-CM | POA: Diagnosis not present

## 2017-03-20 ENCOUNTER — Other Ambulatory Visit: Payer: Self-pay | Admitting: Cardiology

## 2017-03-27 ENCOUNTER — Other Ambulatory Visit: Payer: Self-pay | Admitting: Cardiology

## 2017-04-11 ENCOUNTER — Other Ambulatory Visit: Payer: Self-pay | Admitting: Cardiology

## 2017-05-05 ENCOUNTER — Encounter: Payer: Self-pay | Admitting: Internal Medicine

## 2017-05-05 ENCOUNTER — Other Ambulatory Visit: Payer: Self-pay | Admitting: Cardiology

## 2017-05-05 ENCOUNTER — Ambulatory Visit (INDEPENDENT_AMBULATORY_CARE_PROVIDER_SITE_OTHER): Payer: Medicare Other | Admitting: Internal Medicine

## 2017-05-05 VITALS — BP 122/62 | HR 58 | Ht 67.0 in | Wt 170.0 lb

## 2017-05-05 DIAGNOSIS — D649 Anemia, unspecified: Secondary | ICD-10-CM

## 2017-05-05 DIAGNOSIS — Z87898 Personal history of other specified conditions: Secondary | ICD-10-CM | POA: Diagnosis not present

## 2017-05-05 DIAGNOSIS — E785 Hyperlipidemia, unspecified: Secondary | ICD-10-CM | POA: Diagnosis not present

## 2017-05-05 LAB — COMPREHENSIVE METABOLIC PANEL
A/G RATIO: 2.2 (ref 1.2–2.2)
ALT: 15 IU/L (ref 0–44)
AST: 17 IU/L (ref 0–40)
Albumin: 4.6 g/dL (ref 3.5–4.8)
Alkaline Phosphatase: 113 IU/L (ref 39–117)
BUN / CREAT RATIO: 24 (ref 10–24)
BUN: 21 mg/dL (ref 8–27)
Bilirubin Total: 0.7 mg/dL (ref 0.0–1.2)
CHLORIDE: 102 mmol/L (ref 96–106)
CO2: 24 mmol/L (ref 20–29)
Calcium: 9.7 mg/dL (ref 8.6–10.2)
Creatinine, Ser: 0.87 mg/dL (ref 0.76–1.27)
GFR calc Af Amer: 98 mL/min/{1.73_m2} (ref >59)
GFR calc non Af Amer: 84 mL/min/{1.73_m2} (ref >59)
Globulin, Total: 2.1 g/dL (ref 1.5–4.5)
Glucose: 97 mg/dL (ref 65–99)
POTASSIUM: 4.6 mmol/L (ref 3.5–5.2)
Sodium: 142 mmol/L (ref 134–144)
TOTAL PROTEIN: 6.7 g/dL (ref 6.0–8.5)

## 2017-05-05 LAB — CBC WITH DIFFERENTIAL/PLATELET
BASOS ABS: 0 10*3/uL (ref 0.0–0.2)
Basos: 0 %
EOS (ABSOLUTE): 0.2 10*3/uL (ref 0.0–0.4)
Eos: 4 %
Hematocrit: 43.9 % (ref 37.5–51.0)
Hemoglobin: 15.3 g/dL (ref 13.0–17.7)
Immature Grans (Abs): 0 10*3/uL (ref 0.0–0.1)
Immature Granulocytes: 0 %
LYMPHS ABS: 1.8 10*3/uL (ref 0.7–3.1)
Lymphs: 29 %
MCH: 29.2 pg (ref 26.6–33.0)
MCHC: 34.9 g/dL (ref 31.5–35.7)
MCV: 84 fL (ref 79–97)
MONOCYTES: 11 %
Monocytes Absolute: 0.7 10*3/uL (ref 0.1–0.9)
Neutrophils Absolute: 3.4 10*3/uL (ref 1.4–7.0)
Neutrophils: 56 %
PLATELETS: 177 10*3/uL (ref 150–379)
RBC: 5.24 x10E6/uL (ref 4.14–5.80)
RDW: 13.5 % (ref 12.3–15.4)
WBC: 6.2 10*3/uL (ref 3.4–10.8)

## 2017-05-05 LAB — LIPID PANEL
Chol/HDL Ratio: 2 ratio (ref 0.0–5.0)
Cholesterol, Total: 132 mg/dL (ref 100–199)
HDL: 66 mg/dL (ref >39)
LDL Calculated: 57 mg/dL (ref 0–99)
Triglycerides: 45 mg/dL (ref 0–149)
VLDL Cholesterol Cal: 9 mg/dL (ref 5–40)

## 2017-05-05 NOTE — Patient Instructions (Signed)
Medication Instructions:  Your physician recommends that you continue on your current medications as directed. Please refer to the Current Medication list given to you today.   Labwork: CMET/Lipid profile/CBCd today.  Testing/Procedures: none  Follow-Up: Your physician wants you to follow-up in: 1 year with Dr End. (August 2019).  You will receive a reminder letter in the mail two months in advance. If you don't receive a letter, please call our office to schedule the follow-up appointment.        If you need a refill on your cardiac medications before your next appointment, please call your pharmacy.

## 2017-05-05 NOTE — Progress Notes (Signed)
Follow-up Outpatient Visit Date: 05/05/2017  Primary Care Provider: Patient, No Pcp Per No address on file  Chief Complaint: Follow-up chest pain and hyperlipidemia  HPI:  Mr. Greenstreet is a 76 y.o. year-old male with history of hyperlipidemia, PVCs, and recently diagnosed prostate cancer, who presents for follow-up of chest pain and hyperlipidemia. He was previously seen by Dr. Aundra Dubin in 02/2016 at which time he reported feeling well. Of note, he had had an abnormal exercise tolerance test in the past but myocardial perfusion stress test was normal without ischemia or scar.  Today, Mr. Berrios reports feeling well. He has not had any chest pain, shortness of breath, palpitations lightheadedness, orthopnea, or PND. He notes occasional mild leg swelling. He is tolerating his current medication regimen well, including atorvastatin. He brings up the possibility of discontinuing his statin. He is currently undergoing some watchful waiting for management of his prostate cancer.  --------------------------------------------------------------------------------------------------  Cardiovascular History & Procedures: Cardiovascular Problems:  Chest pain  Risk Factors:  Hyperlipidemia, male gender, age greater than 59  Cath/PCI:  None  CV Surgery:  None  EP Procedures and Devices:  None  Non-Invasive Evaluation(s):  Exercise MPI (11/10/11, [facility]): ST depression during stress but no ischemia or infarction on perfusion images. LVEF 74%.  Recent CV Pertinent Labs: Lab Results  Component Value Date   CHOL 130 02/15/2016   HDL 59 02/15/2016   LDLCALC 62 02/15/2016   TRIG 43 02/15/2016   CHOLHDL 2.2 02/15/2016   INR 1.20 02/06/2017   K 3.7 02/06/2017   BUN 21 (H) 02/06/2017   CREATININE 0.88 02/06/2017   CREATININE 0.92 02/15/2016    Past medical and surgical history were reviewed and updated in EPIC.  Current Meds  Medication Sig  . aspirin EC 81 MG tablet  Take 1 tablet (81 mg total) by mouth daily.  Marland Kitchen atorvastatin (LIPITOR) 10 MG tablet Take 1 tablet (10 mg total) by mouth daily. Patient must keep 05/05/17 appointment with Dr Oluwadamilare Tobler for further refills  . CIALIS 20 MG tablet Take 20 mg by mouth daily as needed for erectile dysfunction. For ED  . tamsulosin (FLOMAX) 0.4 MG CAPS capsule Take 0.8 mg by mouth daily.     Allergies: Codeine  Social History   Social History  . Marital status: Single    Spouse name: N/A  . Number of children: N/A  . Years of education: N/A   Occupational History  . Not on file.   Social History Main Topics  . Smoking status: Former Research scientist (life sciences)  . Smokeless tobacco: Never Used  . Alcohol use 0.0 oz/week  . Drug use: No  . Sexual activity: Not on file   Other Topics Concern  . Not on file   Social History Narrative  . No narrative on file    Family History  Problem Relation Age of Onset  . Stroke Father     Review of Systems: Mr. Tavano was seen in the ED in May for an episode of dizziness and was diagnosed with vertigo. He has not had a recurrence. Otherwise, a 12-system review of systems was performed and was negative except as noted in the HPI.  --------------------------------------------------------------------------------------------------  Physical Exam: BP 122/62   Pulse (!) 58   Ht 5\' 7"  (1.702 m)   Wt 170 lb (77.1 kg)   SpO2 98%   BMI 26.63 kg/m   General:  Well-developed, well-nourished man, seated comfortably in the exam room. HEENT: No conjunctival pallor or scleral icterus. Moist  mucous membranes.  OP clear. Neck: Supple without lymphadenopathy, thyromegaly, JVD, or HJR. Lungs: Normal work of breathing. Clear to auscultation bilaterally without wheezes or crackles. Heart: Regular rate and rhythm without murmurs, rubs, or gallops. Non-displaced PMI. Abd: Bowel sounds present. Soft, NT/ND without hepatosplenomegaly Ext: No lower extremity edema. Radial, PT, and DP pulses are 2+  bilaterally. Skin: Warm and dry without rash.  EKG:  Sinus bradycardia (heart rate 58 bpm) with no significant abnormalities.  Lab Results  Component Value Date   WBC 7.1 02/06/2017   HGB 12.9 (L) 02/06/2017   HCT 39.0 02/06/2017   MCV 85.9 02/06/2017   PLT 153 02/06/2017    Lab Results  Component Value Date   NA 138 02/06/2017   K 3.7 02/06/2017   CL 107 02/06/2017   CO2 24 02/06/2017   BUN 21 (H) 02/06/2017   CREATININE 0.88 02/06/2017   GLUCOSE 117 (H) 02/06/2017   ALT 18 02/06/2017    Lab Results  Component Value Date   CHOL 130 02/15/2016   HDL 59 02/15/2016   LDLCALC 62 02/15/2016   TRIG 43 02/15/2016   CHOLHDL 2.2 02/15/2016    --------------------------------------------------------------------------------------------------  ASSESSMENT AND PLAN: History of chest pain Mr. Tucholski has been asymptomatic for years. Stress test in 2013 in [place] was low risk. We will defer further testing.  Hyperlipidemia Mr. Dolecki had LDL of 62 on most recent lipid panel in May 2017. He remains on intermediate-intensity statin therapy. We discussed the risks and benefits of continuing statin treatment, given lack of atherosclerotic cardiovascular disease. Since Mr. Behrens is tolerating atorvastatin well, we have agreed to continue with this. We will check a CMP and fasting lipid panel today.  Anemia Slight anemia was noted during ED visit in May for an episode of vertigo. I will recheck a CBC today to ensure that his hemoglobin has normalized.  Follow-up: Return to clinic in 1 year.  Nelva Bush, MD 05/05/2017 9:27 AM

## 2017-05-08 ENCOUNTER — Other Ambulatory Visit: Payer: Self-pay | Admitting: *Deleted

## 2017-05-08 MED ORDER — ATORVASTATIN CALCIUM 10 MG PO TABS: 10.0000 mg | ORAL_TABLET | Freq: Every day | ORAL | 3 refills | Status: AC

## 2017-05-08 NOTE — Telephone Encounter (Signed)
Followed by Dr End 

## 2017-08-09 DIAGNOSIS — Z23 Encounter for immunization: Secondary | ICD-10-CM | POA: Diagnosis not present

## 2017-08-23 DIAGNOSIS — C61 Malignant neoplasm of prostate: Secondary | ICD-10-CM | POA: Diagnosis not present

## 2017-08-23 LAB — PSA PROSTATIC SPECIFIC ANTIGEN: PSA: 5.87 ng/mL

## 2017-08-30 DIAGNOSIS — R3912 Poor urinary stream: Secondary | ICD-10-CM | POA: Diagnosis not present

## 2017-08-30 DIAGNOSIS — N401 Enlarged prostate with lower urinary tract symptoms: Secondary | ICD-10-CM | POA: Diagnosis not present

## 2017-08-30 DIAGNOSIS — N5201 Erectile dysfunction due to arterial insufficiency: Secondary | ICD-10-CM | POA: Diagnosis not present

## 2017-08-30 DIAGNOSIS — C61 Malignant neoplasm of prostate: Secondary | ICD-10-CM | POA: Diagnosis not present

## 2018-03-12 ENCOUNTER — Ambulatory Visit: Admit: 2018-03-12 | Discharge: 2018-03-12 | Payer: MEDICARE | Attending: Urology | Primary: Geriatric Medicine

## 2018-03-12 DIAGNOSIS — C61 Malignant neoplasm of prostate: Secondary | ICD-10-CM

## 2018-03-12 LAB — AMB POC URINALYSIS DIP STICK AUTO W/ MICRO (PGU)
Bilirubin (UA POC): NEGATIVE
Bilirubin, Urine, POC: NEGATIVE
Blood (UA POC): NEGATIVE
Blood (UA POC): NEGATIVE
Glucose (UA POC): NEGATIVE mg/dL
Glucose, Urine, POC: NEGATIVE mg/dL
KETONES, Urine, POC: NEGATIVE
Ketones (UA POC): NEGATIVE
Leukocyte Esterase, Urine, POC: NEGATIVE
Leukocyte esterase (UA POC): NEGATIVE
Nitrite, Urine, POC: NEGATIVE
Nitrites (UA POC): NEGATIVE
Protein (UA POC): NEGATIVE
Protein, Urine, POC: NEGATIVE
Specific Gravity, Urine, POC: 1.01 NA (ref 1.001–1.035)
Specific gravity (UA POC): 1.01 (ref 1.001–1.035)
Urobilinogen (POC): 0.2
Urobilinogen, POC: 0.2
pH (UA POC): 5.5 (ref 4.6–8.0)
pH, Urine, POC: 5.5 NA (ref 4.6–8.0)

## 2018-03-12 MED ORDER — TAMSULOSIN SR 0.4 MG 24 HR CAP
0.4 mg | ORAL_CAPSULE | Freq: Two times a day (BID) | ORAL | 3 refills | Status: DC
Start: 2018-03-12 — End: 2019-04-20

## 2018-03-12 MED ORDER — TAMSULOSIN SR 0.4 MG 24 HR CAP
0.4 mg | ORAL_CAPSULE | Freq: Two times a day (BID) | ORAL | 3 refills | Status: DC
Start: 2018-03-12 — End: 2018-03-12

## 2018-03-12 NOTE — Progress Notes (Signed)
[facility]  [address] [place] [address]  [phone]    Christopher Gonzalez  DOB: [birth year]    Chief Complaint   Patient presents with   ??? Prostate Cancer        HPI     Christopher Gonzalez is a 77 y.o. male moved here from [place], [place]. Had prostate cancer diagnosed in 4-18. MRI fusion biopsy then showed 1/12 biopsies with 3+3 prostate cancer in 10% of sample. Being managed with active surveillance. Previous negative biopsy in 2017. Had 102 gm prostate then.   PSA was 5.04 in 12-16 with right apical nodule.   PSA 5.87 in 11-18.   Takes Tamsulosin 0.8 mg daily   He c/o low libido.   Had some fatigue, he discussed checking T level when he was in [place].      Past Medical History:   Diagnosis Date   ??? Elevated PSA    ??? Erectile dysfunction    ??? Hypercholesterolemia    ??? Personal history of prostate cancer      No past surgical history on file.  Current Outpatient Medications   Medication Sig Dispense Refill   ??? atorvastatin (LIPITOR) 10 mg tablet Take 10 mg by mouth daily.     ??? aspirin delayed-release 81 mg tablet Take 81 mg by mouth daily.     ??? tadalafil (CIALIS) 20 mg tablet Take 20 mg by mouth as needed.     ??? sildenafil citrate (VIAGRA) 50 mg tablet Take 50 mg by mouth as needed.     ??? meclizine (ANTIVERT) 12.5 mg tablet Take  by mouth as needed.     ??? cyanocobalamin (VITAMIN B-12) 100 mcg tablet Take 100 mcg by mouth daily.     ??? pyridoxine HCl, vitamin B6, (VITAMIN B-6 PO) Take  by mouth daily.     ??? BIOTIN PO Take 5,000 mcg by mouth daily.     ??? turmeric (CURCUMIN) 250 mg by Does Not Apply route daily.     ??? multivitamin (ONE A DAY) tablet Take 1 Tab by mouth daily.     ??? s-adenosylmethionine (SAM-E) 200 mg tab Take 200 mg by mouth daily.     ??? tamsulosin (FLOMAX) 0.4 mg capsule Take 1 Cap by mouth two (2) times a day. 180 Cap 3     Not on File  Social History     Socioeconomic History   ??? Marital status: UNKNOWN     Spouse name: Not on file   ??? Number of children: Not on file    ??? Years of education: Not on file   ??? Highest education level: Not on file   Occupational History   ??? Not on file   Social Needs   ??? Financial resource strain: Not on file   ??? Food insecurity:     Worry: Not on file     Inability: Not on file   ??? Transportation needs:     Medical: Not on file     Non-medical: Not on file   Tobacco Use   ??? Smoking status: Former Smoker   ??? Smokeless tobacco: Never Used   Substance and Sexual Activity   ??? Alcohol use: Yes   ??? Drug use: Not on file   ??? Sexual activity: Not on file   Lifestyle   ??? Physical activity:     Days per week: Not on file     Minutes per session: Not on file   ??? Stress: Not  on file   Relationships   ??? Social connections:     Talks on phone: Not on file     Gets together: Not on file     Attends religious service: Not on file     Active member of club or organization: Not on file     Attends meetings of clubs or organizations: Not on file     Relationship status: Not on file   ??? Intimate partner violence:     Fear of current or ex partner: Not on file     Emotionally abused: Not on file     Physically abused: Not on file     Forced sexual activity: Not on file   Other Topics Concern   ??? Not on file   Social History Narrative   ??? Not on file     No family history on file.    Review of Systems  Constitutional:   Negative for fever and headaches.  ENT:  Negative for high frequency hearing loss.  Respiratory:  Negative for shortness of breath.  Cardiovascular: Positive for varicose veins. Negative for chest pain.  GI:  Negative for abdominal pain.  Genitourinary: Positive for nocturia, slower stream, urgency, frequent urination and erectile dysfunction. Negative for urinary burning and hematuria.  Musculoskeletal:  Negative for back pain.  Neurological:  Negative for numbness.  Psychological:  Negative for depression.  Endocrine:  Negative for fatigue.  Hem/Lymphatic:  Negative for easy bruising.      Visit Vitals  BP 154/74   Pulse 67   Temp 97 ??F (36.1 ??C) (Oral)    Wt 177 lb (80.3 kg)   SpO2 95%   Physical Exam  General??  Mental Status??- Patient is alert and oriented X3. General Appearance??- Not in acute distress. Build & Nutrition??- Well nourished and Well developed. Gait??- Normal.      Eye??  Pupil??- Bilateral??- Normal, Equal and Round.      Chest and Lung Exam??  Auscultation:??  Lung Sounds:??- Normal, clear to auscultation.      Cardiovascular??  Cardiovascular examination reveals?? - normal heart sounds, regular rate and rhythm with no murmurs.      Abdomen??  Palpation/Percussion:??Palpation and Percussion of the abdomen reveal - No Rebound tenderness, No Rigidity (guarding), No hepatosplenomegaly and No Palpable abdominal masses.  Auscultation:??Auscultation of the abdomen reveals - Bowel sounds normal.      Male Genitourinary??  Evaluation of genitourinary system reveals - scrotum non-tender, no masses, normal testes, no palpable masses, normal epididymides, urethral meatus normal, no discharge, normal penis and normal seminal vesicles.      Rectal??  Anorectal Exam:??Prostate??- 2+, symmetric, no nodules    Peripheral Vascular??  Lower Extremity:??Inspection??- Bilateral??- Inspection Normal.      Neurologic??  Neurologic evaluation reveals?? - upper and lower extremity deep tendon reflexes intact bilaterally .  Cranial Nerves:??- Cranial nerves are grossly intact.      Neuropsychiatric??  The patient's mood and affect are described as?? - normal.      Musculoskeletal  Global Assessment??  Examination of related systems reveals - no generalized swelling or edema of extremities.      Lymphatic  General Lymphatics??  Description??- No Generalized lymphadenopathy. Tenderness??- Non Tender.        Urinalysis  UA - Dipstick  Results for orders placed or performed in visit on 03/12/18   AMB POC URINALYSIS DIP STICK AUTO W/ MICRO (PGU)     Status: None   Result Value Ref Range Status  Glucose (UA POC) Negative Negative mg/dL Final    Bilirubin (UA POC) Negative Negative Final     Ketones (UA POC) Negative Negative Final    Specific gravity (UA POC) 1.010 1.001 - 1.035 Final    Blood (UA POC) Negative Negative Final    pH (UA POC) 5.5 4.6 - 8.0 Final    Protein (UA POC) Negative Negative Final    Urobilinogen (POC) 0.2 mg/dL  Final    Nitrites (UA POC) Negative Negative Final    Leukocyte esterase (UA POC) Negative Negative Final       UA - Micro  WBC - 0  RBC - 0  Bacteria - 0  Epith - 0    Physical Exam    Assessment and Plan    ICD-10-CM ICD-9-CM    1. Malignant neoplasm of prostate (HCC) C61 185 AMB POC URINALYSIS DIP STICK AUTO W/ MICRO (PGU)      PSA, DIAGNOSTIC (PROSTATE SPECIFIC AG)   2. Benign localized prostatic hyperplasia with lower urinary tract symptoms (LUTS) N40.1 600.21      599.69    3. Libido, decreased R68.82 799.81 TESTOSTERONE, TOTAL, ADULT MALE       Orders Placed This Encounter   ??? PROSTATE SPECIFIC AG   ??? TESTOSTERONE, TOTAL, ADULT MALE     Standing Status:   Future     Standing Expiration Date:   09/11/2019   ??? AMB POC URINALYSIS DIP STICK AUTO W/ MICRO (PGU)   ??? DISCONTD: tamsulosin (FLOMAX) 0.4 mg capsule     Sig: Take 0.4 mg by mouth two (2) times a day.   ??? atorvastatin (LIPITOR) 10 mg tablet     Sig: Take 10 mg by mouth daily.   ??? aspirin delayed-release 81 mg tablet     Sig: Take 81 mg by mouth daily.   ??? tadalafil (CIALIS) 20 mg tablet     Sig: Take 20 mg by mouth as needed.   ??? sildenafil citrate (VIAGRA) 50 mg tablet     Sig: Take 50 mg by mouth as needed.   ??? meclizine (ANTIVERT) 12.5 mg tablet     Sig: Take  by mouth as needed.   ??? cyanocobalamin (VITAMIN B-12) 100 mcg tablet     Sig: Take 100 mcg by mouth daily.   ??? pyridoxine HCl, vitamin B6, (VITAMIN B-6 PO)     Sig: Take  by mouth daily.   ??? BIOTIN PO     Sig: Take 5,000 mcg by mouth daily.   ??? turmeric (CURCUMIN)     Sig: 250 mg by Does Not Apply route daily.   ??? multivitamin (ONE A DAY) tablet     Sig: Take 1 Tab by mouth daily.   ??? s-adenosylmethionine (SAM-E) 200 mg tab      Sig: Take 200 mg by mouth daily.   ??? tamsulosin (FLOMAX) 0.4 mg capsule     Sig: Take 1 Cap by mouth two (2) times a day.     Dispense:  180 Cap     Refill:  3       Follow-up and Dispositions    ?? Return in about 6 weeks (around 04/23/2018).       We discussed repeat biopsy. Will follow PSA for now .  Call patient with results  Will check T level prior to next visit.

## 2018-03-12 NOTE — Addendum Note (Signed)
Addended by: Luna Kitchens on: 03/12/2018 03:49 PM     Modules accepted: Orders

## 2018-03-12 NOTE — Addendum Note (Signed)
Addendum Note by Ellyn Hack, CMA at 03/12/18 1350                Author: Ellyn Hack, CMA  Service: --  Author Type: Medical Assistant       Filed: 03/12/18 1549  Encounter Date: 03/12/2018  Status: Signed          Editor: Ellyn Hack, CMA (Medical Assistant)          Addended by: Ellyn Hack on: 03/12/2018 03:49 PM    Modules accepted: Orders

## 2018-03-12 NOTE — Progress Notes (Signed)
[facility]  [address] [place] [address]  [phone]    Christopher Gonzalez  DOB: September 11, 1941    Chief Complaint   Patient presents with   ??? Prostate Cancer        HPI     Christopher Gonzalez is a 77 y.o. male moved here from [place], [place]. Had prostate cancer diagnosed in 4-18. MRI fusion biopsy then showed 1/12 biopsies with 3+3 prostate cancer in 10% of sample. Being managed with active surveillance. Previous negative biopsy in 2017. Had 102 gm prostate then.   PSA was 5.04 in 12-16 with right apical nodule.   PSA 5.87 in 11-18.   Takes Tamsulosin 0.8 mg daily   He c/o low libido.   Had some fatigue, he discussed checking T level when he was in [place].      Past Medical History:   Diagnosis Date   ??? Elevated PSA    ??? Erectile dysfunction    ??? Hypercholesterolemia    ??? Personal history of prostate cancer      No past surgical history on file.  Current Outpatient Medications   Medication Sig Dispense Refill   ??? atorvastatin (LIPITOR) 10 mg tablet Take 10 mg by mouth daily.     ??? aspirin delayed-release 81 mg tablet Take 81 mg by mouth daily.     ??? tadalafil (CIALIS) 20 mg tablet Take 20 mg by mouth as needed.     ??? sildenafil citrate (VIAGRA) 50 mg tablet Take 50 mg by mouth as needed.     ??? meclizine (ANTIVERT) 12.5 mg tablet Take  by mouth as needed.     ??? cyanocobalamin (VITAMIN B-12) 100 mcg tablet Take 100 mcg by mouth daily.     ??? pyridoxine HCl, vitamin B6, (VITAMIN B-6 PO) Take  by mouth daily.     ??? BIOTIN PO Take 5,000 mcg by mouth daily.     ??? turmeric (CURCUMIN) 250 mg by Does Not Apply route daily.     ??? multivitamin (ONE A DAY) tablet Take 1 Tab by mouth daily.     ??? s-adenosylmethionine (SAM-E) 200 mg tab Take 200 mg by mouth daily.     ??? tamsulosin (FLOMAX) 0.4 mg capsule Take 1 Cap by mouth two (2) times a day. 180 Cap 3     Not on File  Social History     Socioeconomic History   ??? Marital status: UNKNOWN     Spouse name: Not on file   ??? Number of children: Not on file   ??? Years of  education: Not on file   ??? Highest education level: Not on file   Occupational History   ??? Not on file   Social Needs   ??? Financial resource strain: Not on file   ??? Food insecurity:     Worry: Not on file     Inability: Not on file   ??? Transportation needs:     Medical: Not on file     Non-medical: Not on file   Tobacco Use   ??? Smoking status: Former Smoker   ??? Smokeless tobacco: Never Used   Substance and Sexual Activity   ??? Alcohol use: Yes   ??? Drug use: Not on file   ??? Sexual activity: Not on file   Lifestyle   ??? Physical activity:     Days per week: Not on file     Minutes per session: Not on file   ??? Stress: Not  on file   Relationships   ??? Social connections:     Talks on phone: Not on file     Gets together: Not on file     Attends religious service: Not on file     Active member of club or organization: Not on file     Attends meetings of clubs or organizations: Not on file     Relationship status: Not on file   ??? Intimate partner violence:     Fear of current or ex partner: Not on file     Emotionally abused: Not on file     Physically abused: Not on file     Forced sexual activity: Not on file   Other Topics Concern   ??? Not on file   Social History Narrative   ??? Not on file     No family history on file.    Review of Systems  Constitutional:   Negative for fever and headaches.  ENT:  Negative for high frequency hearing loss.  Respiratory:  Negative for shortness of breath.  Cardiovascular: Positive for varicose veins. Negative for chest pain.  GI:  Negative for abdominal pain.  Genitourinary: Positive for nocturia, slower stream, urgency, frequent urination and erectile dysfunction. Negative for urinary burning and hematuria.  Musculoskeletal:  Negative for back pain.  Neurological:  Negative for numbness.  Psychological:  Negative for depression.  Endocrine:  Negative for fatigue.  Hem/Lymphatic:  Negative for easy bruising.      Visit Vitals  BP 154/74   Pulse 67   Temp 97 ??F (36.1 ??C) (Oral)   Wt 177 lb  (80.3 kg)   SpO2 95%   Physical Exam  General??  Mental Status??- Patient is alert and oriented X3. General Appearance??- Not in acute distress. Build & Nutrition??- Well nourished and Well developed. Gait??- Normal.      Eye??  Pupil??- Bilateral??- Normal, Equal and Round.      Chest and Lung Exam??  Auscultation:??  Lung Sounds:??- Normal, clear to auscultation.      Cardiovascular??  Cardiovascular examination reveals?? - normal heart sounds, regular rate and rhythm with no murmurs.      Abdomen??  Palpation/Percussion:??Palpation and Percussion of the abdomen reveal - No Rebound tenderness, No Rigidity (guarding), No hepatosplenomegaly and No Palpable abdominal masses.  Auscultation:??Auscultation of the abdomen reveals - Bowel sounds normal.      Male Genitourinary??  Evaluation of genitourinary system reveals - scrotum non-tender, no masses, normal testes, no palpable masses, normal epididymides, urethral meatus normal, no discharge, normal penis and normal seminal vesicles.      Rectal??  Anorectal Exam:??Prostate??- 2+, symmetric, no nodules    Peripheral Vascular??  Lower Extremity:??Inspection??- Bilateral??- Inspection Normal.      Neurologic??  Neurologic evaluation reveals?? - upper and lower extremity deep tendon reflexes intact bilaterally .  Cranial Nerves:??- Cranial nerves are grossly intact.      Neuropsychiatric??  The patient's mood and affect are described as?? - normal.      Musculoskeletal  Global Assessment??  Examination of related systems reveals - no generalized swelling or edema of extremities.      Lymphatic  General Lymphatics??  Description??- No Generalized lymphadenopathy. Tenderness??- Non Tender.        Urinalysis  UA - Dipstick  Results for orders placed or performed in visit on 03/12/18   AMB POC URINALYSIS DIP STICK AUTO W/ MICRO (PGU)     Status: None   Result Value Ref Range Status  Glucose (UA POC) Negative Negative mg/dL Final    Bilirubin (UA POC) Negative Negative Final    Ketones (UA POC) Negative  Negative Final    Specific gravity (UA POC) 1.010 1.001 - 1.035 Final    Blood (UA POC) Negative Negative Final    pH (UA POC) 5.5 4.6 - 8.0 Final    Protein (UA POC) Negative Negative Final    Urobilinogen (POC) 0.2 mg/dL  Final    Nitrites (UA POC) Negative Negative Final    Leukocyte esterase (UA POC) Negative Negative Final       UA - Micro  WBC - 0  RBC - 0  Bacteria - 0  Epith - 0    Physical Exam    Assessment and Plan    ICD-10-CM ICD-9-CM    1. Malignant neoplasm of prostate (HCC) C61 185 AMB POC URINALYSIS DIP STICK AUTO W/ MICRO (PGU)      PSA, DIAGNOSTIC (PROSTATE SPECIFIC AG)   2. Benign localized prostatic hyperplasia with lower urinary tract symptoms (LUTS) N40.1 600.21      599.69    3. Libido, decreased R68.82 799.81 TESTOSTERONE, TOTAL, ADULT MALE       Orders Placed This Encounter   ??? PROSTATE SPECIFIC AG   ??? TESTOSTERONE, TOTAL, ADULT MALE     Standing Status:   Future     Standing Expiration Date:   09/11/2019   ??? AMB POC URINALYSIS DIP STICK AUTO W/ MICRO (PGU)   ??? DISCONTD: tamsulosin (FLOMAX) 0.4 mg capsule     Sig: Take 0.4 mg by mouth two (2) times a day.   ??? atorvastatin (LIPITOR) 10 mg tablet     Sig: Take 10 mg by mouth daily.   ??? aspirin delayed-release 81 mg tablet     Sig: Take 81 mg by mouth daily.   ??? tadalafil (CIALIS) 20 mg tablet     Sig: Take 20 mg by mouth as needed.   ??? sildenafil citrate (VIAGRA) 50 mg tablet     Sig: Take 50 mg by mouth as needed.   ??? meclizine (ANTIVERT) 12.5 mg tablet     Sig: Take  by mouth as needed.   ??? cyanocobalamin (VITAMIN B-12) 100 mcg tablet     Sig: Take 100 mcg by mouth daily.   ??? pyridoxine HCl, vitamin B6, (VITAMIN B-6 PO)     Sig: Take  by mouth daily.   ??? BIOTIN PO     Sig: Take 5,000 mcg by mouth daily.   ??? turmeric (CURCUMIN)     Sig: 250 mg by Does Not Apply route daily.   ??? multivitamin (ONE A DAY) tablet     Sig: Take 1 Tab by mouth daily.   ??? s-adenosylmethionine (SAM-E) 200 mg tab     Sig: Take 200 mg by mouth daily.   ??? tamsulosin  (FLOMAX) 0.4 mg capsule     Sig: Take 1 Cap by mouth two (2) times a day.     Dispense:  180 Cap     Refill:  3       Follow-up and Dispositions    ?? Return in about 6 weeks (around 04/23/2018).       We discussed repeat biopsy. Will follow PSA for now .  Call patient with results  Will check T level prior to next visit.

## 2018-03-13 ENCOUNTER — Encounter: Attending: Urology | Primary: Geriatric Medicine

## 2018-03-13 LAB — PSA, DIAGNOSTIC (PROSTATE SPECIFIC AG): Prostate Specific Ag: 4.6 ng/mL — ABNORMAL HIGH (ref 0.0–4.0)

## 2018-03-13 LAB — PSA PROSTATIC SPECIFIC ANTIGEN: PSA: 4.6 ng/mL — ABNORMAL HIGH (ref 0.0–4.0)

## 2018-05-14 ENCOUNTER — Encounter: Primary: Geriatric Medicine

## 2018-05-15 LAB — TESTOSTERONE, TOTAL, ADULT MALE: Testosterone: 428 ng/dL (ref 264–916)

## 2018-05-15 LAB — TESTOSTERONE: Testosterone: 428 ng/dL (ref 264–916)

## 2018-05-17 ENCOUNTER — Other Ambulatory Visit: Payer: Self-pay | Admitting: Cardiology

## 2018-05-22 ENCOUNTER — Ambulatory Visit: Attending: Family Medicine | Primary: Geriatric Medicine

## 2018-05-22 ENCOUNTER — Ambulatory Visit: Admit: 2018-05-22 | Discharge: 2018-05-22 | Payer: MEDICARE | Attending: Family Medicine | Primary: Geriatric Medicine

## 2018-05-22 DIAGNOSIS — E7800 Pure hypercholesterolemia, unspecified: Secondary | ICD-10-CM

## 2018-05-22 NOTE — ACP (Advance Care Planning) (Signed)
Patient states he will bring a copy of HCPOA to scan.

## 2018-05-22 NOTE — Progress Notes (Signed)
Wellness exam also done today.     This is an Initial Medicare Annual Wellness Exam (AWV) (Performed 12 months after IPPE or effective date of Medicare Part B enrollment, Once in a lifetime)    I have reviewed the patient's medical history in detail and updated the computerized patient record.     History     Past Medical History:   Diagnosis Date   ??? Hypercholesterolemia    ??? Prostate cancer [facility])       Past Surgical History:   Procedure Laterality Date   ??? HX APPENDECTOMY      1950   ??? HX CHOLECYSTECTOMY     ??? HX HERNIA REPAIR     ??? 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 atorvastatin (LIPITOR) 10 mg tablet Take 10 mg by mouth daily.     ??? aspirin delayed-release 81 mg tablet Take 81 mg by mouth daily.     ??? sildenafil citrate (VIAGRA) 50 mg tablet Take 50 mg by mouth as needed.     ??? meclizine (ANTIVERT) 12.5 mg tablet Take  by mouth as needed.     ??? cyanocobalamin (VITAMIN B-12) 100 mcg tablet Take 100 mcg by mouth daily.     ??? pyridoxine HCl, vitamin B6, (VITAMIN B-6 PO) Take  by mouth daily.     ??? BIOTIN PO Take 5,000 mcg by mouth daily.     ??? turmeric (CURCUMIN) 250 mg by Does Not Apply route daily.     ??? multivitamin (ONE A DAY) tablet Take 1 Tab by mouth daily.     ??? s-adenosylmethionine (SAM-E) 200 mg tab Take 200 mg by mouth daily.     ??? tamsulosin (FLOMAX) 0.4 mg capsule Take 1 Cap by mouth two (2) times a day. 180 Cap 3   ??? tadalafil (CIALIS) 20 mg tablet Take 20 mg by mouth as needed.       Allergies   Allergen Reactions   ??? Codeine Nausea and Vomiting     Family History   Problem Relation Age of Onset   ??? Dementia Mother    ??? Stroke Father    ??? No Known Problems Brother    ??? No Known Problems Brother      Social History     Tobacco Use   ??? Smoking status: Former Smoker   ??? Smokeless tobacco: Never Used   Substance Use Topics   ??? Alcohol use: Yes     Alcohol/week: 2.0 standard drinks      Types: 1 Glasses of wine, 1 Shots of liquor per week     Patient Active Problem List   Diagnosis Code   ??? Hyperlipidemia E78.5   ??? Hypertension I10   ??? Impaired fasting glucose R73.01       Depression Risk Factor Screening:     3 most recent PHQ Screens 05/22/2018   Little interest or pleasure in doing things Not at all   Feeling down, depressed, irritable, or hopeless Not at all   Total Score PHQ 2 0     Alcohol Risk Factor Screening:   On any occasion in the past three months you have had more than 7 drinks containing alcohol    Functional Ability and Level of Safety:     Hearing Loss  Hearing is good.    Activities of Daily Living  The home contains: no safety equipment.  Patient does total self care    Fall Risk  Fall Risk Assessment, last 12 mths 05/22/2018   Able to walk? Yes   Fall in past 12 months? No       Abuse Screen  Patient is not abused    Cognitive Screening   Evaluation of Cognitive Function:  Has your family/caregiver stated any concerns about your memory: no  Normal, Clock Drawing test, Mini Cog test Mini-Cog Test: 2 out of 3 correct     Patient Care Team   Patient Care Team:  Gregary Signs, MD as PCP - General (Family Practice)  Marya Fossa, MD as PCP - UROLOGY (Urology)    Assessment/Plan   Education and counseling provided:  Are appropriate based on today's review and evaluation    Diagnoses and all orders for this visit:    1.  Initial Medicare annual wellness visit  Reports he will bring advanced care plans to file    2. Encounter for immunization  -     ADMIN PNEUMOCOCCAL VACCINE  -     PNEUMOCOCCAL CONJ VACCINE 13 VALENT IM  He declines flu vaccine for now  Recommend TdaP vaccine - tetanus with pertussis (whooping cough) - may get that at most local pharmacies  Recommend Shingrix (shingles) vaccine, may get at pharmacy     Next wellness exam 05/24/19

## 2018-05-22 NOTE — Progress Notes (Signed)
[facility]  Rochele [place] [address]  Office : [phone]  Fax : [phone]  05/22/2018    HISTORY OF PRESENT ILLNESS  Christopher Gonzalez is a 76 y.o. 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    Cardiovascular: Negative for chest pain, palpitations and leg swelling.   Gastrointestinal: Negative for abdominal pain.     Allergies   Allergen Reactions   ??? Codeine Nausea and Vomiting       Past Medical History:   Diagnosis Date   ??? Hypercholesterolemia    ??? Prostate cancer [facility])        Past Surgical History:   Procedure Laterality Date   ??? HX APPENDECTOMY      1950   ??? HX CHOLECYSTECTOMY     ??? HX HERNIA REPAIR     ??? HX MALIGNANT SKIN LESION EXCISION      basal cell - right cheek       Social History     Socioeconomic History   ??? Marital status: DIVORCED     Spouse name: Not on file   ??? Number of children: Not on file   ??? Years of education: Not on file   ??? Highest education level: Not on file   Tobacco Use   ??? Smoking status: Former Smoker   ??? Smokeless tobacco: Never Used   Substance and Sexual Activity   ??? Alcohol use: Yes     Alcohol/week: 2.0 standard drinks     Types: 1 Glasses of wine, 1 Shots of liquor per week   ??? Drug use: Never   Social History Narrative    Retired Actor - Pharmacist, hospital.  From [place], [place], moved to area from [place] Status   Relation Name Status   ??? Mother  Deceased   ??? Father  Deceased   ??? Brother  Alive   ??? Brother  Alive     Family History    Problem Relation Age of Onset   ??? Dementia Mother    ??? Stroke Father    ??? No Known Problems Brother    ??? No Known Problems Brother        Visit Vitals  BP 138/82 (BP 1 Location: Left arm, BP Patient Position: Sitting)   Pulse 66   Temp 97.7 ??F (36.5 ??C) (Oral)   Ht 5' 4.25" (1.632 m)   Wt 172 lb (78 kg)   SpO2 98%   BMI 29.29 kg/m??       Physical Exam   Constitutional: He appears well-developed and well-nourished.   Neck: Neck supple. Carotid bruit is not present. No thyroid mass and no thyromegaly present.   Cardiovascular: Normal rate, regular rhythm and normal heart sounds.   Pulses:       Carotid pulses are 2+ on the right side, and 2+ on the left side.       Posterior tibial pulses  are 2+ on the right side, and 2+ on the left side.   Peripheral - no edema    Pulmonary/Chest: Effort normal and breath sounds normal.   Abdominal: Soft. Bowel sounds are normal. There is no hepatosplenomegaly. There is no tenderness.   Neurological: He is alert.   Psychiatric: He has a normal mood and affect. His speech is normal and behavior is normal.       ASSESSMENT and PLAN  Diagnoses and all orders for this visit:    1. Pure hypercholesterolemia -  - New problem, stable  Checking lab, continues on Atorvastatin 10mg  daily  -     LIPID PANEL  -     REFERRAL TO CARDIOLOGY    2. Essential hypertension - - New problem, stable  BP stable.  Not currently on med.  Checking lab  -     METABOLIC PANEL, COMPREHENSIVE  -     REFERRAL TO CARDIOLOGY    3. Impaired fasting glucose - - New problem, stable  At some risk for diabetes with weight  -     HEMOGLOBIN A1C WITH EAG    4. History of basal cell carcinoma (BCC) of skin - - New problem, stable  Referral to dermatologist  -     REFERRAL TO DERMATOLOGY    Follow up pending lab results

## 2018-05-22 NOTE — Patient Instructions (Addendum)
Medicare Wellness Visit, Male    The best way to live healthy is to have a lifestyle where you eat a well-balanced diet, exercise regularly, limit alcohol use, and quit all forms of tobacco/nicotine, if applicable.   Regular preventive services are another way to keep healthy. Preventive services (vaccines, screening tests, monitoring & exams) can help personalize your care plan, which helps you manage your own care. Screening tests can find health problems at the earliest stages, when they are easiest to treat.   [facility] follows the current, evidence-based guidelines published by the [place] [employer] (USPSTF) when recommending preventive services for our patients. Because we follow these guidelines, sometimes recommendations change over time as research supports it. (For example, a prostate screening blood test is no longer routinely recommended for men with no symptoms.)  Of course, you and your doctor may decide to screen more often for some diseases, based on your risk and co-morbidities (chronic disease you are already diagnosed with).   Preventive services for you include:  - Medicare offers their members a free annual wellness visit, which is time for you and your primary care provider to discuss and plan for your preventive service needs. Take advantage of this benefit every year!  -All adults over age 50 should receive the recommended pneumonia vaccines. Current USPSTF guidelines recommend a series of two vaccines for the best pneumonia protection.   -All adults should have a flu vaccine yearly and an ECG. All adults age 31 and older should receive a shingles vaccine once in their lifetime.    -All adults age 10-70 who are overweight should have a diabetes screening test once every three years.   -Other screening tests & preventive services for persons with diabetes include: an eye exam to screen for diabetic retinopathy, a kidney function  test, a foot exam, and stricter control over your cholesterol.   -Cardiovascular screening for adults with routine risk involves an electrocardiogram (ECG) at intervals determined by the provider.   -Colorectal cancer screening should be done for adults age 62-75 with no increased risk factors for colorectal cancer.  There are a number of acceptable methods of screening for this type of cancer. Each test has its own benefits and drawbacks. Discuss with your provider what is most appropriate for you during your annual wellness visit. The different tests include: colonoscopy (considered the best screening method), a fecal occult blood test, a fecal DNA test, and sigmoidoscopy.  -All adults born between [place] should be screened once for Hepatitis C.  -An Abdominal Aortic Aneurysm (AAA) Screening is recommended for men age 66-75 who has ever smoked in their lifetime.     Here is a list of your current Health Maintenance items (your personalized list of preventive services) with a due date:  Health Maintenance Due   Topic Date Due   ??? DTaP/Tdap/Td  (1 - Tdap) 10/26/1962   ??? Shingles Vaccine (1 of 2) 10/27/1991   ??? Pneumococcal Vaccine (1 of 2 - PCV13) 10/26/2006   ??? Flu Vaccine  05/03/2018     Recommend TdaP vaccine - tetanus with pertussis (whooping cough) - may get that at most local pharmacies  Recommend Shingrix (shingles) vaccine, may get at pharmacy

## 2018-05-22 NOTE — Progress Notes (Signed)
Metabolic panel, which includes sugar (glucose), kidney, liver, and electrolyte tests, is normal.  Lipid panel - Total cholesterol, Triglycerides, HDL (good cholesterol), and LDL (bad cholesterol) are all good  Hemoglobin A1c, which looks at long term control of sugars, is minimally elevated    Can recheck labs in 1 year.  Continue current medications  Dr. Ky Barban  Results released to Mychart with comments

## 2018-05-22 NOTE — ACP (Advance Care Planning) (Signed)
Patient states he will bring a copy of HCPOA to scan.

## 2018-05-22 NOTE — Progress Notes (Signed)
[facility]  Rochele [place] [address]  Office : [phone]  Fax : [phone]  05/22/2018    HISTORY OF PRESENT ILLNESS  Christopher Gonzalez is a 77 y.o. 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    Cardiovascular: Negative for chest pain, palpitations and leg swelling.   Gastrointestinal: Negative for abdominal pain.     Allergies   Allergen Reactions   ??? Codeine Nausea and Vomiting       Past Medical History:   Diagnosis Date   ??? Hypercholesterolemia    ??? Prostate cancer [facility])        Past Surgical History:   Procedure Laterality Date   ??? HX APPENDECTOMY      1950   ??? HX CHOLECYSTECTOMY     ??? HX HERNIA REPAIR     ??? HX MALIGNANT SKIN LESION EXCISION      basal cell - right cheek       Social History     Socioeconomic History   ??? Marital status: DIVORCED     Spouse name: Not on file   ??? Number of children: Not on file   ??? Years of education: Not on file   ??? Highest education level: Not on file   Tobacco Use   ??? Smoking status: Former Smoker   ??? Smokeless tobacco: Never Used   Substance and Sexual Activity   ??? Alcohol use: Yes     Alcohol/week: 2.0 standard drinks     Types: 1 Glasses of wine, 1 Shots of liquor per week   ??? Drug use: Never   Social History Narrative    Retired Actor - Pharmacist, hospital.  From [place], [place], moved to area from [place] Status   Relation Name Status   ??? Mother  Deceased   ??? Father  Deceased   ??? Brother  Alive   ??? Brother  Alive     Family History   Problem Relation  Age of Onset   ??? Dementia Mother    ??? Stroke Father    ??? No Known Problems Brother    ??? No Known Problems Brother        Visit Vitals  BP 138/82 (BP 1 Location: Left arm, BP Patient Position: Sitting)   Pulse 66   Temp 97.7 ??F (36.5 ??C) (Oral)   Ht 5' 4.25" (1.632 m)   Wt 172 lb (78 kg)   SpO2 98%   BMI 29.29 kg/m??       Physical Exam   Constitutional: He appears well-developed and well-nourished.   Neck: Neck supple. Carotid bruit is not present. No thyroid mass and no thyromegaly present.   Cardiovascular: Normal rate, regular rhythm and normal heart sounds.   Pulses:       Carotid pulses are 2+ on the right side, and 2+ on the left side.       Posterior tibial pulses  are 2+ on the right side, and 2+ on the left side.   Peripheral - no edema    Pulmonary/Chest: Effort normal and breath sounds normal.   Abdominal: Soft. Bowel sounds are normal. There is no hepatosplenomegaly. There is no tenderness.   Neurological: He is alert.   Psychiatric: He has a normal mood and affect. His speech is normal and behavior is normal.       ASSESSMENT and PLAN  Diagnoses and all orders for this visit:    1. Pure hypercholesterolemia -  - New problem, stable  Checking lab, continues on Atorvastatin 10mg  daily  -     LIPID PANEL  -     REFERRAL TO CARDIOLOGY    2. Essential hypertension - - New problem, stable  BP stable.  Not currently on med.  Checking lab  -     METABOLIC PANEL, COMPREHENSIVE  -     REFERRAL TO CARDIOLOGY    3. Impaired fasting glucose - - New problem, stable  At some risk for diabetes with weight  -     HEMOGLOBIN A1C WITH EAG    4. History of basal cell carcinoma (BCC) of skin - - New problem, stable  Referral to dermatologist  -     REFERRAL TO DERMATOLOGY    Follow up pending lab results

## 2018-05-22 NOTE — Progress Notes (Signed)
Wellness exam also done today.     This is an Initial Medicare Annual Wellness Exam (AWV) (Performed 12 months after IPPE or effective date of Medicare Part B enrollment, Once in a lifetime)    I have reviewed the patient's medical history in detail and updated the computerized patient record.     History     Past Medical History:   Diagnosis Date   ??? Hypercholesterolemia    ??? Prostate cancer [facility])       Past Surgical History:   Procedure Laterality Date   ??? HX APPENDECTOMY      1950   ??? HX CHOLECYSTECTOMY     ??? HX HERNIA REPAIR     ??? 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 atorvastatin (LIPITOR) 10 mg tablet Take 10 mg by mouth daily.     ??? aspirin delayed-release 81 mg tablet Take 81 mg by mouth daily.     ??? sildenafil citrate (VIAGRA) 50 mg tablet Take 50 mg by mouth as needed.     ??? meclizine (ANTIVERT) 12.5 mg tablet Take  by mouth as needed.     ??? cyanocobalamin (VITAMIN B-12) 100 mcg tablet Take 100 mcg by mouth daily.     ??? pyridoxine HCl, vitamin B6, (VITAMIN B-6 PO) Take  by mouth daily.     ??? BIOTIN PO Take 5,000 mcg by mouth daily.     ??? turmeric (CURCUMIN) 250 mg by Does Not Apply route daily.     ??? multivitamin (ONE A DAY) tablet Take 1 Tab by mouth daily.     ??? s-adenosylmethionine (SAM-E) 200 mg tab Take 200 mg by mouth daily.     ??? tamsulosin (FLOMAX) 0.4 mg capsule Take 1 Cap by mouth two (2) times a day. 180 Cap 3   ??? tadalafil (CIALIS) 20 mg tablet Take 20 mg by mouth as needed.       Allergies   Allergen Reactions   ??? Codeine Nausea and Vomiting     Family History   Problem Relation Age of Onset   ??? Dementia Mother    ??? Stroke Father    ??? No Known Problems Brother    ??? No Known Problems Brother      Social History     Tobacco Use   ??? Smoking status: Former Smoker   ??? Smokeless tobacco: Never Used   Substance Use Topics   ??? Alcohol use: Yes     Alcohol/week: 2.0 standard drinks     Types: 1 Glasses of  wine, 1 Shots of liquor per week     Patient Active Problem List   Diagnosis Code   ??? Hyperlipidemia E78.5   ??? Hypertension I10   ??? Impaired fasting glucose R73.01       Depression Risk Factor Screening:     3 most recent PHQ Screens 05/22/2018   Little interest or pleasure in doing things Not at all   Feeling down, depressed, irritable, or hopeless Not at all   Total Score PHQ 2 0     Alcohol Risk Factor Screening:   On any occasion in the past three months you have had more than 7 drinks containing alcohol    Functional Ability and Level of Safety:     Hearing Loss  Hearing is good.    Activities of Daily Living  The home contains: no safety equipment.  Patient does total self care    Fall Risk  Fall Risk Assessment, last 12 mths 05/22/2018   Able to walk? Yes   Fall in past 12 months? No       Abuse Screen  Patient is not abused    Cognitive Screening   Evaluation of Cognitive Function:  Has your family/caregiver stated any concerns about your memory: no  Normal, Clock Drawing test, Mini Cog test Mini-Cog Test: 2 out of 3 correct     Patient Care Team   Patient Care Team:  Gregary Signs, MD as PCP - General (Family Practice)  Marya Fossa, MD as PCP - UROLOGY (Urology)    Assessment/Plan   Education and counseling provided:  Are appropriate based on today's review and evaluation    Diagnoses and all orders for this visit:    1.  Initial Medicare annual wellness visit  Reports he will bring advanced care plans to file    2. Encounter for immunization  -     ADMIN PNEUMOCOCCAL VACCINE  -     PNEUMOCOCCAL CONJ VACCINE 13 VALENT IM  He declines flu vaccine for now  Recommend TdaP vaccine - tetanus with pertussis (whooping cough) - may get that at most local pharmacies  Recommend Shingrix (shingles) vaccine, may get at pharmacy     Next wellness exam 05/24/19

## 2018-05-23 LAB — METABOLIC PANEL, COMPREHENSIVE
A-G Ratio: 1.9 (ref 1.2–2.2)
ALT (SGPT): 18 IU/L (ref 0–44)
AST (SGOT): 21 IU/L (ref 0–40)
Albumin: 4.2 g/dL (ref 3.5–4.8)
Alk. phosphatase: 109 IU/L (ref 39–117)
BUN/Creatinine ratio: 22 (ref 10–24)
BUN: 20 mg/dL (ref 8–27)
Bilirubin, total: 0.5 mg/dL (ref 0.0–1.2)
CO2: 24 mmol/L (ref 20–29)
Calcium: 9.1 mg/dL (ref 8.6–10.2)
Chloride: 106 mmol/L (ref 96–106)
Creatinine: 0.89 mg/dL (ref 0.76–1.27)
GFR est AA: 96 mL/min/{1.73_m2} (ref >59)
GFR est non-AA: 83 mL/min/{1.73_m2} (ref >59)
GLOBULIN, TOTAL: 2.2 g/dL (ref 1.5–4.5)
Glucose: 85 mg/dL (ref 65–99)
Potassium: 4.7 mmol/L (ref 3.5–5.2)
Protein, total: 6.4 g/dL (ref 6.0–8.5)
Sodium: 142 mmol/L (ref 134–144)

## 2018-05-23 LAB — HEMOGLOBIN A1C WITH EAG
Estimated average glucose: 117 mg/dL
Hemoglobin A1c: 5.7 % — ABNORMAL HIGH (ref 4.8–5.6)

## 2018-05-23 LAB — LIPID PANEL
Cholesterol, Total: 138 mg/dL (ref 100–199)
Cholesterol, total: 138 mg/dL (ref 100–199)
HDL Cholesterol: 53 mg/dL (ref >39)
HDL: 53 mg/dL (ref >39)
LDL Calculated: 77 mg/dL (ref 0–99)
LDL, calculated: 77 mg/dL (ref 0–99)
Triglyceride: 41 mg/dL (ref 0–149)
Triglycerides: 41 mg/dL (ref 0–149)
VLDL Cholesterol Calculated: 8 mg/dL (ref 5–40)
VLDL, calculated: 8 mg/dL (ref 5–40)

## 2018-05-23 LAB — COMPREHENSIVE METABOLIC PANEL
ALT: 18 IU/L (ref 0–44)
AST: 21 IU/L (ref 0–40)
Albumin/Globulin Ratio: 1.9 NA (ref 1.2–2.2)
Albumin: 4.2 g/dL (ref 3.5–4.8)
Alkaline Phosphatase: 109 IU/L (ref 39–117)
BUN: 20 mg/dL (ref 8–27)
Bun/Cre Ratio: 22 NA (ref 10–24)
CO2: 24 mmol/L (ref 20–29)
Calcium: 9.1 mg/dL (ref 8.6–10.2)
Chloride: 106 mmol/L (ref 96–106)
Creatinine: 0.89 mg/dL (ref 0.76–1.27)
EGFR IF NonAfrican American: 83 mL/min/{1.73_m2} (ref >59)
GFR African American: 96 mL/min/{1.73_m2} (ref >59)
Globulin, Total: 2.2 g/dL (ref 1.5–4.5)
Glucose: 85 mg/dL (ref 65–99)
Potassium: 4.7 mmol/L (ref 3.5–5.2)
Sodium: 142 mmol/L (ref 134–144)
Total Bilirubin: 0.5 mg/dL (ref 0.0–1.2)
Total Protein: 6.4 g/dL (ref 6.0–8.5)

## 2018-05-23 LAB — HEMOGLOBIN A1C W/EAG
Hemoglobin A1C: 5.7 % — ABNORMAL HIGH (ref 4.8–5.6)
eAG: 117 mg/dL

## 2018-05-25 ENCOUNTER — Ambulatory Visit: Attending: Urology | Primary: Geriatric Medicine

## 2018-05-25 ENCOUNTER — Ambulatory Visit: Admit: 2018-05-25 | Discharge: 2018-05-25 | Payer: MEDICARE | Attending: Urology | Primary: Geriatric Medicine

## 2018-05-25 DIAGNOSIS — C61 Malignant neoplasm of prostate: Secondary | ICD-10-CM

## 2018-05-25 LAB — AMB POC URINALYSIS DIP STICK AUTO W/ MICRO (PGU)
Bilirubin (UA POC): NEGATIVE
Bilirubin, Urine, POC: NEGATIVE
Blood (UA POC): NEGATIVE
Blood (UA POC): NEGATIVE
Glucose (UA POC): NEGATIVE mg/dL
Glucose, Urine, POC: NEGATIVE mg/dL
KETONES, Urine, POC: NEGATIVE
Ketones (UA POC): NEGATIVE
Leukocyte Esterase, Urine, POC: NEGATIVE
Leukocyte esterase (UA POC): NEGATIVE
Nitrite, Urine, POC: NEGATIVE
Nitrites (UA POC): NEGATIVE
Protein (UA POC): NEGATIVE
Protein, Urine, POC: NEGATIVE
Specific Gravity, Urine, POC: 1.015 NA (ref 1.001–1.035)
Specific gravity (UA POC): 1.015 (ref 1.001–1.035)
Urobilinogen (POC): 0.2
Urobilinogen, POC: 0.2
pH (UA POC): 8.5 — AB (ref 4.6–8.0)
pH, Urine, POC: 8.5 NA — AB (ref 4.6–8.0)

## 2018-05-25 NOTE — Progress Notes (Signed)
[facility]  [address]  Christopher Gonzalez, Christopher Gonzalez  [phone]    Christopher Gonzalez  DOB: 04-Jan-1941    Chief Complaint   Patient presents with   ??? Prostate Cancer        HPI     Christopher Gonzalez is a 77 y.o. male moved here from [place], [place]. Had prostate cancer diagnosed in 4-18. MRI fusion biopsy then showed 1/12 biopsies with 3+3 prostate cancer in 10% of sample. Being managed with active surveillance. Previous negative biopsy in 2017. Had 102 gm prostate then.   PSA was 5.04 in 12-16 with right apical nodule.   PSA 5.87 in 11-18.   Takes Tamsulosin 0.8 mg daily   He c/o low libido.   Had some fatigue,      PSA 4.6 in 6-19. Testosterone normal at 428 IN 8-19.    Past Medical History:   Diagnosis Date   ??? Erectile dysfunction    ??? Hypercholesterolemia    ??? Personal history of prostate cancer    ??? Prostate cancer [facility])      Past Surgical History:   Procedure Laterality Date   ??? HX APPENDECTOMY      1950   ??? HX CHOLECYSTECTOMY     ??? HX HERNIA REPAIR     ??? 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 atorvastatin (LIPITOR) 10 mg tablet Take 10 mg by mouth daily.     ??? aspirin delayed-release 81 mg tablet Take 81 mg by mouth daily.     ??? tadalafil (CIALIS) 20 mg tablet Take 20 mg by mouth as needed.     ??? sildenafil citrate (VIAGRA) 50 mg tablet Take 50 mg by mouth as needed.     ??? meclizine (ANTIVERT) 12.5 mg tablet Take  by mouth as needed.     ??? cyanocobalamin (VITAMIN B-12) 100 mcg tablet Take 100 mcg by mouth daily.     ??? pyridoxine HCl, vitamin B6, (VITAMIN B-6 PO) Take  by mouth daily.     ??? BIOTIN PO Take 5,000 mcg by mouth daily.     ??? turmeric (CURCUMIN) 250 mg by Does Not Apply route daily.     ??? multivitamin (ONE A DAY) tablet Take 1 Tab by mouth daily.     ??? s-adenosylmethionine (SAM-E) 200 mg tab Take 200 mg by mouth daily.     ??? tamsulosin (FLOMAX) 0.4 mg capsule Take 1 Cap by mouth two (2) times a  day. 180 Cap 3     Allergies   Allergen Reactions   ??? Codeine Nausea and Vomiting     Social History     Socioeconomic History   ??? Marital status: DIVORCED     Spouse name: Not on file   ??? Number of children: Not on file   ??? Years of education: Not on file   ??? Highest education level: Not on file   Occupational History   ??? Not on file   Social Needs   ??? Financial resource strain: Not on file   ??? Food insecurity:     Worry: Not on file     Inability: Not on file   ??? Transportation needs:     Medical: Not on file     Non-medical: Not on file   Tobacco Use   ??? Smoking status: Former Smoker   ???  Smokeless tobacco: Never Used   Substance and Sexual Activity   ??? Alcohol use: Yes     Alcohol/week: 2.0 standard drinks     Types: 1 Glasses of wine, 1 Shots of liquor per week   ??? Drug use: Never   ??? Sexual activity: Not on file   Lifestyle   ??? Physical activity:     Days per week: Not on file     Minutes per session: Not on file   ??? Stress: Not on file   Relationships   ??? Social connections:     Talks on phone: Not on file     Gets together: Not on file     Attends religious service: Not on file     Active member of club or organization: Not on file     Attends meetings of clubs or organizations: Not on file     Relationship status: Not on file   ??? Intimate partner violence:     Fear of current or ex partner: Not on file     Emotionally abused: Not on file     Physically abused: Not on file     Forced sexual activity: Not on file   Other Topics Concern   ??? Not on file   Social History Narrative    Retired Actor - Pharmacist, hospital.  From [place], [place], moved to area from [place] History   Problem Relation Age of Onset   ??? Dementia Mother    ??? Stroke Father    ??? No Known Problems Brother    ??? No Known Problems Brother        Review of Systems  Constitutional:   Negative for fever and headaches.  ENT:  Negative for high frequency hearing loss.  Respiratory:  Negative for shortness of breath.   Cardiovascular:  Negative for chest pain.  GI:  Negative for abdominal pain.  Genitourinary:  Negative for urinary burning and hematuria.  Musculoskeletal:  Negative for back pain.  Neurological:  Negative for numbness.  Psychological:  Negative for depression.  Endocrine:  Negative for fatigue.  Hem/Lymphatic:  Negative for easy bruising.      Visit Vitals  BP 143/69   Pulse 86   Temp 97.2 ??F (36.2 ??C) (Oral)   Ht 5\' 7"  (1.702 m)   Wt 172 lb 12.8 oz (78.4 kg)   BMI 27.06 kg/m??       Urinalysis  UA - Dipstick  Results for orders placed or performed in visit on 05/25/18   AMB POC URINALYSIS DIP STICK AUTO W/ MICRO (PGU)     Status: Abnormal   Result Value Ref Range Status    Glucose (UA POC) Negative Negative mg/dL Final    Bilirubin (UA POC) Negative Negative Final    Ketones (UA POC) Negative Negative Final    Specific gravity (UA POC) 1.015 1.001 - 1.035 Final    Blood (UA POC) Negative Negative Final    pH (UA POC) 8.5 (A) 4.6 - 8.0 Final    Protein (UA POC) Negative Negative Final    Urobilinogen (POC) 0.2 mg/dL  Final    Nitrites (UA POC) Negative Negative Final    Leukocyte esterase (UA POC) Negative Negative Final       UA - Micro  WBC - 0  RBC - 0  Bacteria - 0  Epith - 0    Physical Exam    Assessment and Plan  ICD-10-CM ICD-9-CM    1. Malignant neoplasm of prostate ([place]) C61 185 AMB POC URINALYSIS DIP STICK AUTO W/ MICRO (PGU)      PSA, DIAGNOSTIC (PROSTATE SPECIFIC AG)   2. 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 PROSTATE SPECIFIC AG     Standing Status:   Future     Standing Expiration Date:   12/23/2018   ??? AMB POC URINALYSIS DIP STICK AUTO W/ MICRO (PGU)   PSA stable, continue AS. DRE on return. Voiding well on Tamsulosin.     Follow-up and Dispositions    ?? Return in about 6 months (around 11/25/2018) for PSA.

## 2018-05-25 NOTE — Progress Notes (Signed)
[facility]  [address] [place] [address]  [phone]    Christopher Gonzalez  DOB: May 15, 1941    Chief Complaint   Patient presents with   ??? Prostate Cancer        HPI     Christopher Gonzalez is a 77 y.o. male moved here from [place], [place]. Had prostate cancer diagnosed in 4-18. MRI fusion biopsy then showed 1/12 biopsies with 3+3 prostate cancer in 10% of sample. Being managed with active surveillance. Previous negative biopsy in 2017. Had 102 gm prostate then.   PSA was 5.04 in 12-16 with right apical nodule.   PSA 5.87 in 11-18.   Takes Tamsulosin 0.8 mg daily   He c/o low libido.   Had some fatigue,      PSA 4.6 in 6-19. Testosterone normal at 428 IN 8-19.    Past Medical History:   Diagnosis Date   ??? Erectile dysfunction    ??? Hypercholesterolemia    ??? Personal history of prostate cancer    ??? Prostate cancer [facility])      Past Surgical History:   Procedure Laterality Date   ??? HX APPENDECTOMY      1950   ??? HX CHOLECYSTECTOMY     ??? HX HERNIA REPAIR     ??? 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 atorvastatin (LIPITOR) 10 mg tablet Take 10 mg by mouth daily.     ??? aspirin delayed-release 81 mg tablet Take 81 mg by mouth daily.     ??? tadalafil (CIALIS) 20 mg tablet Take 20 mg by mouth as needed.     ??? sildenafil citrate (VIAGRA) 50 mg tablet Take 50 mg by mouth as needed.     ??? meclizine (ANTIVERT) 12.5 mg tablet Take  by mouth as needed.     ??? cyanocobalamin (VITAMIN B-12) 100 mcg tablet Take 100 mcg by mouth daily.     ??? pyridoxine HCl, vitamin B6, (VITAMIN B-6 PO) Take  by mouth daily.     ??? BIOTIN PO Take 5,000 mcg by mouth daily.     ??? turmeric (CURCUMIN) 250 mg by Does Not Apply route daily.     ??? multivitamin (ONE A DAY) tablet Take 1 Tab by mouth daily.     ??? s-adenosylmethionine (SAM-E) 200 mg tab Take 200 mg by mouth daily.     ??? tamsulosin (FLOMAX) 0.4 mg capsule Take 1 Cap by mouth two (2) times a day.  180 Cap 3     Allergies   Allergen Reactions   ??? Codeine Nausea and Vomiting     Social History     Socioeconomic History   ??? Marital status: DIVORCED     Spouse name: Not on file   ??? Number of children: Not on file   ??? Years of education: Not on file   ??? Highest education level: Not on file   Occupational History   ??? Not on file   Social Needs   ??? Financial resource strain: Not on file   ??? Food insecurity:     Worry: Not on file     Inability: Not on file   ??? Transportation needs:     Medical: Not on file     Non-medical: Not on file   Tobacco Use   ??? Smoking status: Former Smoker   ???  Smokeless tobacco: Never Used   Substance and Sexual Activity   ??? Alcohol use: Yes     Alcohol/week: 2.0 standard drinks     Types: 1 Glasses of wine, 1 Shots of liquor per week   ??? Drug use: Never   ??? Sexual activity: Not on file   Lifestyle   ??? Physical activity:     Days per week: Not on file     Minutes per session: Not on file   ??? Stress: Not on file   Relationships   ??? Social connections:     Talks on phone: Not on file     Gets together: Not on file     Attends religious service: Not on file     Active member of club or organization: Not on file     Attends meetings of clubs or organizations: Not on file     Relationship status: Not on file   ??? Intimate partner violence:     Fear of current or ex partner: Not on file     Emotionally abused: Not on file     Physically abused: Not on file     Forced sexual activity: Not on file   Other Topics Concern   ??? Not on file   Social History Narrative    Retired Actor - Pharmacist, hospital.  From [place], [place], moved to area from [place] History   Problem Relation Age of Onset   ??? Dementia Mother    ??? Stroke Father    ??? No Known Problems Brother    ??? No Known Problems Brother        Review of Systems  Constitutional:   Negative for fever and headaches.  ENT:  Negative for high frequency hearing loss.  Respiratory:  Negative for shortness of breath.  Cardiovascular:   Negative for chest pain.  GI:  Negative for abdominal pain.  Genitourinary:  Negative for urinary burning and hematuria.  Musculoskeletal:  Negative for back pain.  Neurological:  Negative for numbness.  Psychological:  Negative for depression.  Endocrine:  Negative for fatigue.  Hem/Lymphatic:  Negative for easy bruising.      Visit Vitals  BP 143/69   Pulse 86   Temp 97.2 ??F (36.2 ??C) (Oral)   Ht 5\' 7"  (1.702 m)   Wt 172 lb 12.8 oz (78.4 kg)   BMI 27.06 kg/m??       Urinalysis  UA - Dipstick  Results for orders placed or performed in visit on 05/25/18   AMB POC URINALYSIS DIP STICK AUTO W/ MICRO (PGU)     Status: Abnormal   Result Value Ref Range Status    Glucose (UA POC) Negative Negative mg/dL Final    Bilirubin (UA POC) Negative Negative Final    Ketones (UA POC) Negative Negative Final    Specific gravity (UA POC) 1.015 1.001 - 1.035 Final    Blood (UA POC) Negative Negative Final    pH (UA POC) 8.5 (A) 4.6 - 8.0 Final    Protein (UA POC) Negative Negative Final    Urobilinogen (POC) 0.2 mg/dL  Final    Nitrites (UA POC) Negative Negative Final    Leukocyte esterase (UA POC) Negative Negative Final       UA - Micro  WBC - 0  RBC - 0  Bacteria - 0  Epith - 0    Physical Exam    Assessment and Plan  ICD-10-CM ICD-9-CM    1. Malignant neoplasm of prostate ([place]) C61 185 AMB POC URINALYSIS DIP STICK AUTO W/ MICRO (PGU)      PSA, DIAGNOSTIC (PROSTATE SPECIFIC AG)   2. 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 PROSTATE SPECIFIC AG     Standing Status:   Future     Standing Expiration Date:   12/23/2018   ??? AMB POC URINALYSIS DIP STICK AUTO W/ MICRO (PGU)   PSA stable, continue AS. DRE on return. Voiding well on Tamsulosin.     Follow-up and Dispositions    ?? Return in about 6 months (around 11/25/2018) for PSA.

## 2018-06-11 ENCOUNTER — Encounter: Attending: Cardiovascular Disease | Primary: Geriatric Medicine

## 2018-07-10 ENCOUNTER — Ambulatory Visit: Attending: Cardiovascular Disease | Primary: Geriatric Medicine

## 2018-07-10 ENCOUNTER — Ambulatory Visit
Admit: 2018-07-10 | Discharge: 2018-07-10 | Payer: MEDICARE | Attending: Cardiovascular Disease | Primary: Geriatric Medicine

## 2018-07-10 DIAGNOSIS — E785 Hyperlipidemia, unspecified: Secondary | ICD-10-CM

## 2018-07-10 MED ORDER — ATORVASTATIN 10 MG TAB
10 mg | ORAL_TABLET | Freq: Every day | ORAL | 3 refills | Status: DC
Start: 2018-07-10 — End: 2018-09-14

## 2018-07-10 NOTE — Addendum Note (Signed)
Addended by: Alycia Patten on: 07/11/2018 05:27 PM     Modules accepted: Level of Service

## 2018-07-10 NOTE — Progress Notes (Signed)
[facility]  [address], [address]  [place] [address]  PHONE: [phone]    NAME: Christopher Gonzalez  DOB: [birth year]     HPI  Christopher Gonzalez is a 77 y.o. male seen for a follow up visit regarding   Referral / Consult     He is here as a self and Dr Ky Barban referral .  He has had annual cardiac surveillance exam and testing ever since being referred to cardiology about 30 years ago.    He had a concerning stress test back then for screening and ended up with nuke alternating with EKG stress every since.   He has moved to [facility] after a late life divorce instituted by his wife.  He has children in both states        He is staying active with golf and exercise .  No chest pain or dyspnea.          Past Medical History, Past Surgical History, Family history, Social History, and Medications were all reviewed with the patient today and updated as necessary.     Outpatient Medications Marked as Taking for the 07/10/18 encounter (Office Visit) with Fenton Foy, MD   Medication Sig Dispense Refill   ??? atorvastatin (LIPITOR) 10 mg tablet Take 1 Tab by mouth daily. 90 Tab 3   ??? aspirin delayed-release 81 mg tablet Take 81 mg by mouth daily.     ??? tadalafil (CIALIS) 20 mg tablet Take 20 mg by mouth as needed.     ??? sildenafil citrate (VIAGRA) 50 mg tablet Take 50 mg by mouth as needed.     ??? meclizine (ANTIVERT) 12.5 mg tablet Take  by mouth as needed.     ??? cyanocobalamin (VITAMIN B-12) 100 mcg tablet Take 100 mcg by mouth daily.     ??? pyridoxine HCl, vitamin B6, (VITAMIN B-6 PO) Take  by mouth daily.     ??? BIOTIN PO Take 5,000 mcg by mouth daily.     ??? turmeric (CURCUMIN) 250 mg by Does Not Apply route daily.     ??? multivitamin (ONE A DAY) tablet Take 1 Tab by mouth daily.     ??? s-adenosylmethionine (SAM-E) 200 mg tab Take 200 mg by mouth daily.     ??? tamsulosin (FLOMAX) 0.4 mg capsule Take 1 Cap by mouth two (2) times a day. 180 Cap 3     Patient Active Problem List    Diagnosis   ??? Hyperlipidemia    ??? Hypertension   ??? Impaired fasting glucose     Allergies   Allergen Reactions   ??? Codeine Nausea and Vomiting     Past Medical History:   Diagnosis Date   ??? Erectile dysfunction    ??? Hypercholesterolemia    ??? Personal history of prostate cancer    ??? Prostate cancer [facility])      Past Surgical History:   Procedure Laterality Date   ??? HX APPENDECTOMY      1950   ??? HX CHOLECYSTECTOMY     ??? HX HERNIA REPAIR     ??? HX MALIGNANT SKIN LESION EXCISION      basal cell - right cheek     Family History   Problem Relation Age of Onset   ??? Dementia Mother    ??? Stroke Father    ??? No Known Problems Brother    ??? No Known Problems Brother       Social History  Tobacco Use   ??? Smoking status: Former Smoker   ??? Smokeless tobacco: Never Used   Substance Use Topics   ??? Alcohol use: Yes     Alcohol/week: 2.0 standard drinks     Types: 1 Glasses of wine, 1 Shots of liquor per week           ROS            Wt Readings from Last 3 Encounters:   07/10/18 172 lb (78 kg)   05/25/18 172 lb 12.8 oz (78.4 kg)   05/22/18 172 lb (78 kg)     BP Readings from Last 3 Encounters:   07/10/18 140/60   05/25/18 143/69   05/22/18 138/82       Visit Vitals  BP 140/60   Pulse 68   Ht _0  (1.702 m)   Wt 172 lb (78 kg)   BMI 26.94 kg/m??         Physical Exam   Constitutional: He appears well-developed and well-nourished.   HENT:   Head: Normocephalic.   Neck: No JVD present. Carotid bruit is not present.   Cardiovascular: Normal rate, regular rhythm and normal heart sounds.   Pulmonary/Chest: Effort normal and breath sounds normal.   Abdominal: Soft.   Musculoskeletal: He exhibits no edema.   Neurological: He is alert.   Skin: Skin is warm and dry.   Psychiatric: He has a normal mood and affect.   Sinus  Rhythm EKG rate 68   WITHIN NORMAL LIMITS    Medical problems and test results were reviewed with the patient today.     No results found for any visits on 07/10/18.      Lab Results   Component Value Date/Time    Hemoglobin A1c 5.7 (H) 05/22/2018 10:00 AM        No results found for: WBC, WBCLT, HGBPOC, HGB, HGBP, HCTPOC, HCT, PHCT, RBCH, PLT, MCV, HGBEXT, HCTEXT, PLTEXT, HGBEXT, HCTEXT, PLTEXT    Lab Results   Component Value Date/Time    Sodium 142 05/22/2018 10:00 AM    Potassium 4.7 05/22/2018 10:00 AM    Chloride 106 05/22/2018 10:00 AM    CO2 24 05/22/2018 10:00 AM    Glucose 85 05/22/2018 10:00 AM    BUN 20 05/22/2018 10:00 AM    Creatinine 0.89 05/22/2018 10:00 AM    BUN/Creatinine ratio 22 05/22/2018 10:00 AM    GFR est AA 96 05/22/2018 10:00 AM    GFR est non-AA 83 05/22/2018 10:00 AM    Calcium 9.1 05/22/2018 10:00 AM       Lab Results   Component Value Date/Time    ALT (SGPT) 18 05/22/2018 10:00 AM    AST (SGOT) 21 05/22/2018 10:00 AM    Alk. phosphatase 109 05/22/2018 10:00 AM    Bilirubin, total 0.5 05/22/2018 10:00 AM       Lab Results   Component Value Date/Time    Cholesterol, total 138 05/22/2018 10:00 AM    HDL Cholesterol 53 05/22/2018 10:00 AM    LDL, calculated 77 05/22/2018 10:00 AM    VLDL, calculated 8 05/22/2018 10:00 AM    Triglyceride 41 05/22/2018 10:00 AM         ASSESSMENT and PLAN    Diagnoses and all orders for this visit:    1. Dyslipidemia  -     atorvastatin (LIPITOR) 10 mg tablet; Take 1 Tab by mouth daily.  -     CT HEART W/O CONT WITH  CALCIUM; Future    2. Essential hypertension  -     AMB POC EKG ROUTINE W/ 12 LEADS, INTER & REP  -     CT HEART W/O CONT WITH CALCIUM; Future    3  abnormal stress test history  remote with annual cardiology f/u      He has no symptoms and has been good about primary prevention with lipids at goal.  I think CT Cor Calcium would be helpful in risk assessment and outlining diagnostic testing going forward.         He may not need daily asa or annual stress testing       I routinely reviewed symptoms of possible MI and the importance of early emergency 911 evaluation and asa 370m  to reduce morbidity mortality and that [facility]           Thank you very much for your kind referral of this pleasant patient .  Please do not hesitate to call me  [phone] any questions or suggestions.                    Follow-up and Dispositions    ?? Return in about 1 year (around 07/11/2019).               JFenton Foy MD  07/10/2018  3:42 PM

## 2018-07-10 NOTE — Addendum Note (Signed)
 Formatting of this note is different from the original.         Addendum  Note by Kerrin Mo at 07/10/18 1530                Author: Saddie Benders S  Service: --  Author Type: --       Filed: 07/11/18 1727  Encounter Date: 07/10/2018  Status: Signed          Editor: Kerrin Mo          Addended by: Kerrin Mo on: 07/11/2018 05:27 PM    Modules accepted: Level of Service        Electronically signed by Leory Plowman, Incoming [facility] Source Conversion at 08/01/2021 12:02 PM EDT

## 2018-07-10 NOTE — Addendum Note (Signed)
Addendum  Note by Alycia Patten at 07/10/18 1530                Author: Sueanne Margarita S  Service: --  Author Type: --       Filed: 07/11/18 1727  Encounter Date: 07/10/2018  Status: Signed          Editor: Sueanne Margarita S          Addended by: Alycia Patten on: 07/11/2018 05:27 PM    Modules accepted: Level of Service

## 2018-07-10 NOTE — Progress Notes (Signed)
Progress  Notes by Fenton Foy, MD at 07/10/18 1530                Author: Fenton Foy, MD  Service: --  Author Type: Physician       Filed: 07/10/18 1721  Encounter Date: 07/10/2018  Status: Signed          Editor: Fenton Foy, MD (Physician)                         [facility]   [address], [address]   [place] [address]   PHONE: [phone]      NAME: Christopher Gonzalez   DOB: 1941-03-10       HPI   Christopher Gonzalez is a 77 y.o.  male seen for a follow up visit regarding    Referral / Consult      He is here as a self and Dr Ky Barban referral .  He has had annual cardiac surveillance exam and testing ever since being referred to cardiology about 30 years ago.    He had a concerning stress test  back then for screening and ended up with nuke alternating with EKG stress every since.   He has moved to [facility] after a late life divorce instituted by his wife.  He has children in both states        He is staying active with golf and exercise .  No chest  pain or dyspnea.               Past Medical History, Past Surgical History, Family history, Social History, and Medications were all reviewed with the patient today and updated as necessary.         Outpatient Medications Marked as Taking for the 07/10/18 encounter (Office Visit) with Fenton Foy, MD          Medication  Sig  Dispense  Refill           ?  atorvastatin (LIPITOR) 10 mg tablet  Take 1 Tab by mouth daily.  90 Tab  3     ?  aspirin delayed-release 81 mg tablet  Take 81 mg by mouth daily.         ?  tadalafil (CIALIS) 20 mg tablet  Take 20 mg by mouth as needed.         ?  sildenafil citrate (VIAGRA) 50 mg tablet  Take 50 mg by mouth as needed.         ?  meclizine (ANTIVERT) 12.5 mg tablet  Take  by mouth as needed.         ?  cyanocobalamin (VITAMIN B-12) 100 mcg tablet  Take 100 mcg by mouth daily.         ?  pyridoxine HCl, vitamin B6, (VITAMIN B-6 PO)  Take  by mouth daily.         ?  BIOTIN PO  Take 5,000 mcg by mouth daily.         ?   turmeric (CURCUMIN)  250 mg by Does Not Apply route daily.         ?  multivitamin (ONE A DAY) tablet  Take 1 Tab by mouth daily.         ?  s-adenosylmethionine (SAM-E) 200 mg tab  Take 200 mg by mouth daily.               ?  tamsulosin (FLOMAX) 0.4 mg capsule  Take 1 Cap by mouth two (2) times a day.  180 Cap  3          Patient Active Problem List          Diagnosis        ?  Hyperlipidemia     ?  Hypertension        ?  Impaired fasting glucose          Allergies        Allergen  Reactions         ?  Codeine  Nausea and Vomiting          Past Medical History:        Diagnosis  Date         ?  Erectile dysfunction       ?  Hypercholesterolemia       ?  Personal history of prostate cancer           ?  Prostate cancer [facility])            Past Surgical History:         Procedure  Laterality  Date          ?  [place]          ?  HX CHOLECYSTECTOMY         ?  HX HERNIA REPAIR         ?  HX MALIGNANT SKIN LESION EXCISION              basal cell - right cheek          Family History         Problem  Relation  Age of Onset          ?  Dementia  Mother       ?  Stroke  Father       ?  No Known Problems  Brother            ?  No Known Problems  Brother             Social History          Tobacco Use         ?  Smoking status:  Former Smoker     ?  Smokeless tobacco:  Never Used       Substance Use Topics         ?  Alcohol use:  Yes              Alcohol/week:  2.0 standard drinks              Types:  1 Glasses of wine, 1 Shots of liquor per week                 ROS                   Wt Readings from Last 3 Encounters:        07/10/18  172 lb (78 kg)     05/25/18  172 lb 12.8 oz (78.4 kg)        05/22/18  172 lb (78 kg)          BP Readings from Last 3 Encounters:        07/10/18  140/60     05/25/18  143/69        05/22/18  138/82           Visit Vitals      BP  140/60     Pulse  68     Ht  5' 7"  (1.702 m)     Wt  172 lb (78 kg)        BMI  26.94 kg/m??              Physical Exam    Constitutional: He  appears well-developed and well-nourished.    HENT:    Head: Normocephalic.    Neck: No JVD present. Carotid bruit is not present.    Cardiovascular: Normal rate, regular rhythm and normal heart sounds.    Pulmonary/Chest: Effort normal and breath sounds normal.    Abdominal: Soft.    Musculoskeletal: He exhibits no edema.   Neurological: He is alert.    Skin: Skin is warm and dry.   Psychiatric: He has a normal mood and affect.    Sinus  Rhythm EKG rate 68    WITHIN NORMAL LIMITS      Medical problems and test results were reviewed with the patient today.       No results found for any visits on 07/10/18.           Lab Results         Component  Value  Date/Time            Hemoglobin A1c  5.7 (H)  05/22/2018 10:00 AM           No results found for: WBC, WBCLT, HGBPOC, HGB, HGBP, HCTPOC, HCT, PHCT, RBCH, PLT, MCV, HGBEXT, HCTEXT, PLTEXT, HGBEXT, HCTEXT, PLTEXT        Lab Results         Component  Value  Date/Time            Sodium  142  05/22/2018 10:00 AM       Potassium  4.7  05/22/2018 10:00 AM       Chloride  106  05/22/2018 10:00 AM       CO2  24  05/22/2018 10:00 AM       Glucose  85  05/22/2018 10:00 AM       BUN  20  05/22/2018 10:00 AM       Creatinine  0.89  05/22/2018 10:00 AM       BUN/Creatinine ratio  22  05/22/2018 10:00 AM       GFR est AA  96  05/22/2018 10:00 AM       GFR est non-AA  83  05/22/2018 10:00 AM            Calcium  9.1  05/22/2018 10:00 AM             Lab Results         Component  Value  Date/Time            ALT (SGPT)  18  05/22/2018 10:00 AM       AST (SGOT)  21  05/22/2018 10:00 AM       Alk. phosphatase  109  05/22/2018 10:00 AM            Bilirubin, total  0.5  05/22/2018 10:00 AM             Lab Results         Component  Value  Date/Time  Cholesterol, total  138  05/22/2018 10:00 AM       HDL Cholesterol  53  05/22/2018 10:00 AM       LDL, calculated  77  05/22/2018 10:00 AM       VLDL, calculated  8  05/22/2018 10:00 AM            Triglyceride  41  05/22/2018 10:00  AM              ASSESSMENT and PLAN      Diagnoses and all orders for this visit:      1. Dyslipidemia   -     atorvastatin (LIPITOR) 10 mg tablet; Take 1 Tab by mouth daily.   -     CT HEART W/O CONT WITH CALCIUM; Future      2. Essential hypertension   -     AMB POC EKG ROUTINE W/ 12 LEADS, INTER & REP   -     CT HEART W/O CONT WITH CALCIUM; Future      3  abnormal stress test history  remote with annual cardiology f/u      He has no symptoms and has been good about primary prevention with lipids at goal.  I think CT Cor Calcium would be helpful  in risk assessment and outlining diagnostic testing going forward.         He may not need daily asa or annual stress testing          I routinely reviewed symptoms of possible MI and the importance of early emergency 911 evaluation and asa 332m  to reduce morbidity mortality and that [facility] are [facility]              Thank you very much for your kind referral of this pleasant patient .  Please do not hesitate to call me  [phone] any questions or suggestions.                               Follow-up and Dispositions      ??  Return in about 1 year (around 07/11/2019).                         JFenton Foy MD   07/10/2018   3:42 PM

## 2018-07-10 NOTE — Progress Notes (Signed)
 Formatting of this note is different from the original.            Progress Notes signed by Seabron Spates, MD at 07/10/2018  5:21 PM                 Author: Seabron Spates, MD  Service: --  Author Type: Physician       Filed: 07/10/2018  3:30 PM  Encounter Date: 07/10/2018  Status: Signed          Editor: Seabron Spates, MD (Physician)                Progress  Notes by Seabron Spates, MD at 07/10/18 1530                       Author: Seabron Spates,  MD   Service: --   Author Type: Physician                 Filed: 07/10/18 1721   Encounter Date: 07/10/2018   Status: Signed                  Editor: Seabron Spates,  MD (Physician)            [facility]    [address], [address]    [place], [place] [address]    PHONE: [phone]      NAME: Christopher Gonzalez    DOB: 01-11-1941       HPI    Christopher Gonzalez is a 77 y.o.   male seen for a follow up visit regarding     Referral / Consult       He is here as a self and Dr Wilson Singer referral .  He has had annual cardiac surveillance exam and testing ever since being referred to cardiology about 30 years ago.    He had a concerning stress test   back then for screening and ended up with nuke alternating with EKG stress every since.   He has moved to [facility] after a late life divorce instituted by his wife.  He has children in both states        He is staying active with golf and exercise .  No chest   pain or dyspnea.            Past Medical History, Past Surgical History, Family history, Social History, and Medications were all reviewed with the patient today and updated as necessary.            Outpatient Medications Marked as Taking for the 07/10/18 encounter (Office Visit) with Seabron Spates, MD                   Medication   Sig   Dispense   Refill                     ?   atorvastatin (LIPITOR) 10 mg tablet   Take 1 Tab by mouth daily.   90 Tab   3        ?   aspirin delayed-release 81 mg tablet   Take 81 mg by mouth daily.              ?   tadalafil (CIALIS) 20 mg tablet   Take 20 mg by  mouth as needed.              ?  sildenafil citrate (VIAGRA) 50 mg tablet   Take 50 mg by mouth as needed.              ?   meclizine (ANTIVERT) 12.5 mg tablet   Take  by mouth as needed.              ?   cyanocobalamin (VITAMIN B-12) 100 mcg tablet   Take 100 mcg by mouth daily.              ?   pyridoxine HCl, vitamin B6, (VITAMIN B-6 PO)   Take  by mouth daily.              ?   BIOTIN PO   Take 5,000 mcg by mouth daily.                     ?   turmeric (CURCUMIN)   250 mg by Does Not Apply route daily.                     ?   multivitamin (ONE A DAY) tablet   Take 1 Tab by mouth daily.              ?   s-adenosylmethionine (SAM-E) 200 mg tab   Take 200 mg by mouth daily.                           ?   tamsulosin (FLOMAX) 0.4 mg capsule   Take 1 Cap by mouth two (2) times a day.   180 Cap   3               Patient Active Problem List                  Diagnosis               ?   Hyperlipidemia        ?   Hypertension               ?   Impaired fasting glucose               Allergies               Allergen   Reactions                 ?   Codeine   Nausea and Vomiting               Past Medical History:               Diagnosis   Date                 ?   Erectile dysfunction           ?   Hypercholesterolemia           ?   Personal history of prostate cancer                    ?   Prostate cancer [facility])                  Past Surgical History:                 Procedure   Laterality   Date                   ?  HX APPENDECTOMY                        1950                   ?   HX CHOLECYSTECTOMY              ?   HX HERNIA REPAIR              ?   HX MALIGNANT SKIN LESION EXCISION                        basal cell - right cheek               Family History                 Problem   Relation   Age of Onset                   ?   Dementia   Mother           ?   Stroke   Father           ?   No Known Problems   Brother                      ?   No Known Problems   Brother                  Social History               Tobacco Use                  ?   Smoking status:   Former Smoker        ?   Smokeless tobacco:   Never Used             Substance Use Topics                 ?   Alcohol use:   Yes                         Alcohol/week:   2.0 standard drinks                         Types:   1 Glasses of wine, 1 Shots of liquor per week                ROS                 Wt Readings from Last 3 Encounters:               07/10/18   172 lb (78 kg)        05/25/18   172 lb 12.8 oz (78.4 kg)               05/22/18   172 lb (78 kg)               BP Readings from Last 3 Encounters:               07/10/18   140/60        05/25/18   143/69               05/22/18   138/82  Visit Vitals           BP   140/60            Pulse   68        Ht   5\' 7"  (1.702 m)        Wt   172 lb (78 kg)               BMI   26.94 kg/m              Physical Exam     Constitutional: He appears well-developed and well-nourished.     HENT:     Head: Normocephalic.     Neck: No JVD present. Carotid bruit is not present.     Cardiovascular: Normal rate, regular rhythm and normal heart sounds.     Pulmonary/Chest: Effort normal and breath sounds normal.     Abdominal: Soft.     Musculoskeletal: He exhibits no edema.   Neurological: He is alert.     Skin: Skin is warm and dry.   Psychiatric: He has a normal mood and affect.     Sinus  Rhythm EKG rate 68     WITHIN NORMAL LIMITS      Medical problems and test results were reviewed with the patient today.       No results found for any visits on 07/10/18.             Lab Results                 Component   Value   Date/Time                      Hemoglobin A1c   5.7 (H)   05/22/2018 10:00 AM            No results found for: WBC, WBCLT, HGBPOC, HGB, HGBP, HCTPOC, HCT, PHCT, RBCH, PLT, MCV, HGBEXT, HCTEXT, PLTEXT, HGBEXT, HCTEXT, PLTEXT           Lab Results                 Component   Value   Date/Time                      Sodium   142   05/22/2018 10:00 AM           Potassium   4.7   05/22/2018 10:00 AM           Chloride   106    05/22/2018 10:00 AM           CO2   24   05/22/2018 10:00 AM           Glucose   85   05/22/2018 10:00 AM           BUN   20   05/22/2018 10:00 AM           Creatinine   0.89   05/22/2018 10:00 AM           BUN/Creatinine ratio   22   05/22/2018 10:00 AM           GFR est AA   96   05/22/2018 10:00 AM           GFR est non-AA   83   05/22/2018 10:00 AM  Calcium   9.1   05/22/2018 10:00 AM                 Lab Results                 Component   Value   Date/Time                      ALT (SGPT)   18   05/22/2018 10:00 AM           AST (SGOT)   21   05/22/2018 10:00 AM           Alk. phosphatase   109   05/22/2018 10:00 AM                      Bilirubin, total   0.5   05/22/2018 10:00 AM                 Lab Results                 Component   Value   Date/Time                      Cholesterol, total   138   05/22/2018 10:00 AM                 HDL Cholesterol   53   05/22/2018 10:00 AM           LDL, calculated   77   05/22/2018 10:00 AM           VLDL, calculated   8   05/22/2018 10:00 AM                      Triglyceride   41   05/22/2018 10:00 AM              ASSESSMENT and PLAN      Diagnoses and all orders for this visit:      1. Dyslipidemia    -     atorvastatin (LIPITOR) 10 mg tablet; Take 1 Tab by mouth daily.    -     CT HEART W/O CONT WITH CALCIUM; Future      2. Essential hypertension    -     AMB POC EKG ROUTINE W/ 12 LEADS, INTER & REP    -     CT HEART W/O CONT WITH CALCIUM; Future      3  abnormal stress test history  remote with annual cardiology f/u      He has no symptoms and has been good about primary prevention with lipids at goal.  I think CT Cor Calcium would be helpful   in risk assessment and outlining diagnostic testing going forward.         He may not need daily asa or annual stress testing         I routinely reviewed symptoms of possible MI and the importance of early emergency 911 evaluation and asa 325mg   to reduce morbidity mortality and that we are exclusively  available for  acute MI treatment   at [facility] [place].            Thank you very much for your kind referral of this pleasant patient .  Please do not hesitate to call me  [phone] with any questions or suggestions.  Follow-up and Dispositions             Return in about 1 year (around 07/11/2019).                  Seabron Spates, MD    07/10/2018    3:42 PM                Electronically signed by Leory Plowman, Incoming [facility] Source Conversion at 08/01/2021 12:01 PM EDT

## 2018-07-10 NOTE — Addendum Note (Signed)
Formatting of this note is different from the original.         Addendum  Note by Alycia Patten at 07/10/18 [place]                Author: Sueanne Margarita S  Service: --  Author Type: --       Filed: 07/11/18 1727  Encounter Date: 07/10/2018  Status: Signed          Editor: Alycia Patten          Addended by: Alycia Patten on: 07/11/2018 05:27 PM    Modules accepted: Level of Service        Electronically signed by Buel Ream, Incoming [facility] Source Conversion at 08/01/2021 12:02 PM EDT

## 2018-07-10 NOTE — Progress Notes (Signed)
Formatting of this note is different from the original.            Progress Notes signed by Fenton Foy, MD at 07/10/2018  5:21 PM                 Author: Fenton Foy, MD  Service: --  Author Type: Physician       Filed: 07/10/2018  3:30 PM  Encounter Date: 07/10/2018  Status: Signed          Editor: Fenton Foy, MD (Physician)                Progress  Notes by Fenton Foy, MD at 07/10/18 1530                       Author: Fenton Foy,  MD   Service: --   Author Type: Physician                 Filed: 07/10/18 1721   Encounter Date: 07/10/2018   Status: Signed                  Editor: Fenton Foy,  MD (Physician)            [facility]    [address], [address]    [place] [address]    PHONE: [phone]      NAME: Christopher Gonzalez    DOB: 02/18/1941       HPI    Christopher Gonzalez is a 77 y.o.   male seen for a follow up visit regarding     Referral / Consult       He is here as a self and Dr Ky Barban referral .  He has had annual cardiac surveillance exam and testing ever since being referred to cardiology about 30 years ago.    He had a concerning stress test   back then for screening and ended up with nuke alternating with EKG stress every since.   He has moved to [facility] after a late life divorce instituted by his wife.  He has children in both states        He is staying active with golf and exercise .  No chest   pain or dyspnea.            Past Medical History, Past Surgical History, Family history, Social History, and Medications were all reviewed with the patient today and updated as necessary.            Outpatient Medications Marked as Taking for the 07/10/18 encounter (Office Visit) with Fenton Foy, MD                   Medication   Sig   Dispense   Refill                     ?   atorvastatin (LIPITOR) 10 mg tablet   Take 1 Tab by mouth daily.   90 Tab   3        ?   aspirin delayed-release 81 mg tablet   Take 81 mg by mouth daily.              ?   tadalafil (CIALIS) 20 mg tablet   Take 20 mg by  mouth as needed.              ?  sildenafil citrate (VIAGRA) 50 mg tablet   Take 50 mg by mouth as needed.              ?   meclizine (ANTIVERT) 12.5 mg tablet   Take  by mouth as needed.              ?   cyanocobalamin (VITAMIN B-12) 100 mcg tablet   Take 100 mcg by mouth daily.              ?   pyridoxine HCl, vitamin B6, (VITAMIN B-6 PO)   Take  by mouth daily.              ?   BIOTIN PO   Take 5,000 mcg by mouth daily.                     ?   turmeric (CURCUMIN)   250 mg by Does Not Apply route daily.                     ?   multivitamin (ONE A DAY) tablet   Take 1 Tab by mouth daily.              ?   s-adenosylmethionine (SAM-E) 200 mg tab   Take 200 mg by mouth daily.                           ?   tamsulosin (FLOMAX) 0.4 mg capsule   Take 1 Cap by mouth two (2) times a day.   180 Cap   3               Patient Active Problem List                  Diagnosis               ?   Hyperlipidemia        ?   Hypertension               ?   Impaired fasting glucose               Allergies               Allergen   Reactions                 ?   Codeine   Nausea and Vomiting               Past Medical History:               Diagnosis   Date                 ?   Erectile dysfunction           ?   Hypercholesterolemia           ?   Personal history of prostate cancer                    ?   Prostate cancer [facility])                  Past Surgical History:                 Procedure   Laterality   Date                   ?  [place]                   ?   HX CHOLECYSTECTOMY              ?   HX HERNIA REPAIR              ?   HX MALIGNANT SKIN LESION EXCISION                        basal cell - right cheek               Family History                 Problem   Relation   Age of Onset                   ?   Dementia   Mother           ?   Stroke   Father           ?   No Known Problems   Brother                      ?   No Known Problems   Brother                  Social History               Tobacco Use                  ?   Smoking status:   Former Smoker        ?   Smokeless tobacco:   Never Used             Substance Use Topics                 ?   Alcohol use:   Yes                         Alcohol/week:   2.0 standard drinks                         Types:   1 Glasses of wine, 1 Shots of liquor per week                ROS                 Wt Readings from Last 3 Encounters:               07/10/18   172 lb (78 kg)        05/25/18   172 lb 12.8 oz (78.4 kg)               05/22/18   172 lb (78 kg)               BP Readings from Last 3 Encounters:               07/10/18   140/60        05/25/18   143/69               05/22/18   138/82  Visit Vitals           BP   140/60            Pulse   68        Ht   5' 7"  (1.702 m)        Wt   172 lb (78 kg)               BMI   26.94 kg/m              Physical Exam     Constitutional: He appears well-developed and well-nourished.     HENT:     Head: Normocephalic.     Neck: No JVD present. Carotid bruit is not present.     Cardiovascular: Normal rate, regular rhythm and normal heart sounds.     Pulmonary/Chest: Effort normal and breath sounds normal.     Abdominal: Soft.     Musculoskeletal: He exhibits no edema.   Neurological: He is alert.     Skin: Skin is warm and dry.   Psychiatric: He has a normal mood and affect.     Sinus  Rhythm EKG rate 68     WITHIN NORMAL LIMITS      Medical problems and test results were reviewed with the patient today.       No results found for any visits on 07/10/18.             Lab Results                 Component   Value   Date/Time                      Hemoglobin A1c   5.7 (H)   05/22/2018 10:00 AM            No results found for: WBC, WBCLT, HGBPOC, HGB, HGBP, HCTPOC, HCT, PHCT, RBCH, PLT, MCV, HGBEXT, HCTEXT, PLTEXT, HGBEXT, HCTEXT, PLTEXT           Lab Results                 Component   Value   Date/Time                      Sodium   142   05/22/2018 10:00 AM           Potassium   4.7   05/22/2018 10:00 AM           Chloride   106    05/22/2018 10:00 AM           CO2   24   05/22/2018 10:00 AM           Glucose   85   05/22/2018 10:00 AM           BUN   20   05/22/2018 10:00 AM           Creatinine   0.89   05/22/2018 10:00 AM           BUN/Creatinine ratio   22   05/22/2018 10:00 AM           GFR est AA   96   05/22/2018 10:00 AM           GFR est non-AA   83   05/22/2018 10:00 AM  Calcium   9.1   05/22/2018 10:00 AM                 Lab Results                 Component   Value   Date/Time                      ALT (SGPT)   18   05/22/2018 10:00 AM           AST (SGOT)   21   05/22/2018 10:00 AM           Alk. phosphatase   109   05/22/2018 10:00 AM                      Bilirubin, total   0.5   05/22/2018 10:00 AM                 Lab Results                 Component   Value   Date/Time                      Cholesterol, total   138   05/22/2018 10:00 AM                 HDL Cholesterol   53   05/22/2018 10:00 AM           LDL, calculated   77   05/22/2018 10:00 AM           VLDL, calculated   8   05/22/2018 10:00 AM                      Triglyceride   41   05/22/2018 10:00 AM              ASSESSMENT and PLAN      Diagnoses and all orders for this visit:      1. Dyslipidemia    -     atorvastatin (LIPITOR) 10 mg tablet; Take 1 Tab by mouth daily.    -     CT HEART W/O CONT WITH CALCIUM; Future      2. Essential hypertension    -     AMB POC EKG ROUTINE W/ 12 LEADS, INTER & REP    -     CT HEART W/O CONT WITH CALCIUM; Future      3  abnormal stress test history  remote with annual cardiology f/u      He has no symptoms and has been good about primary prevention with lipids at goal.  I think CT Cor Calcium would be helpful   in risk assessment and outlining diagnostic testing going forward.         He may not need daily asa or annual stress testing         I routinely reviewed symptoms of possible MI and the importance of early emergency 911 evaluation and asa 342m  to reduce morbidity mortality and that [facility]            Thank you very much for your kind referral of this pleasant patient .  Please do not hesitate to call me  [phone] any questions or suggestions.  Follow-up and Dispositions             Return in about 1 year (around 07/11/2019).                  Fenton Foy, MD    07/10/2018    3:42 PM                Electronically signed by Fenton Foy, MD at 07/10/2018  5:21 PM EDT

## 2018-07-25 ENCOUNTER — Ambulatory Visit: Payer: Self-pay | Primary: Geriatric Medicine

## 2018-08-01 ENCOUNTER — Inpatient Hospital Stay: Admit: 2018-08-01 | Payer: Self-pay | Attending: Cardiovascular Disease | Primary: Geriatric Medicine

## 2018-08-01 DIAGNOSIS — I10 Essential (primary) hypertension: Secondary | ICD-10-CM

## 2018-08-01 NOTE — Progress Notes (Signed)
normal cardiac calcium score is elevated at 939   he could benefit from a nuclear stress test with [place] and a fu with me  diagnosis is CAD.

## 2018-08-07 NOTE — Telephone Encounter (Signed)
-----   Message from Fenton Foy, MD sent at 08/07/2018  3:44 PM EST -----  normal cardiac calcium score is elevated at 939   he could benefit from a nuclear stress test with [place] and a fu with me  diagnosis is CAD.

## 2018-08-09 NOTE — Telephone Encounter (Signed)
I called the patient with the results of the Calcium Score. Pt agrees to  Proceed with the Nuclear Stress Test as recommended by Dr. Colon Flattery. I told him someone from our scheduling department would be calling him. He said to call his cell number.

## 2018-09-05 ENCOUNTER — Encounter

## 2018-09-05 ENCOUNTER — Institutional Professional Consult (permissible substitution): Admit: 2018-09-05 | Discharge: 2018-09-05 | Payer: MEDICARE | Primary: Geriatric Medicine

## 2018-09-05 DIAGNOSIS — R9430 Abnormal result of cardiovascular function study, unspecified: Secondary | ICD-10-CM

## 2018-09-05 NOTE — Progress Notes (Signed)
Nuke stress normal no blockages suggested and heart strength is normal . Patient may f/u to discuss further or if symptoms worsening.

## 2018-09-05 NOTE — Progress Notes (Signed)
[facility]  [address]  [address]  [place] [address]  [phone]       NUCLEAR PERFUSION STUDY      Date: 09/05/2018      Name: Christopher Gonzalez  DOB: 1941/05/05  MRN: [id number]  Age: 77 y.o.  Sex: male    Ht Readings from Last 1 Encounters:   08/01/18 5\' 7"  (1.702 m)     Wt Readings from Last 1 Encounters:   08/01/18 165 lb (74.8 kg)       Referring Physician: Fenton Foy     Family Physician: Gregary Signs, MD    Ordering Physician: Fenton Foy, M.D.    Test Proctor: Zannie Cove, RN    TEST INDICATIONS: Delineation of extent and severity of coronary artery disease and elevated Ca+ score    CAD RISK FACTORS: Age: Man >45, hypertension, former smoker and hypercholesterolemia/hyperlipidemia    PERTINENT HISTORY: CAD    INFORMED CONSENT:  The nature of the procedure and its benefits were discussed with the patient.  Risks include, but are not limited to:  Reaction to radiopharmaceutical agent, reaction to the chemical stress agent (if used),  inability to complete the study, arrhythmias, MI and death.  The patient had no further questions and agreed to proceed.     Stress test performed is a one day study with stress and rest performed on the same day.    SPECT IMAGING DOSAGE:  The patient was injected with 10.4 mCi of Cardiolite Tc41m intravenously at rest with delayed imaging. For stress imaging, the patient was injected with  30.7 mCi of Cardiolite Tc16m intravenously at peak exercise with delayed imaging.    INJECTION SITE: Right hand.    BETA BLOCKER ASSESSMENT: Patient is not currently taking a beta blocker.    INDICATIONS FOR PHARMACOLOGICAL TEST IF NEEDED: N/A .    TEST PROTOCOL: Bruce. The patient exercised into Stage III of the protocol. Total exercise time is 7 minutes.     SYMPTOMS: Patient did not complain of chest pain. .    TEST TERMINATION: The test was stopped due to adequate heart rate achieved, the following: dyspnea, fatigue.      STRESS TEST VITALS:      Resting HR =  70 / minute   Resting B/P =  130/70    Peak HR  =  144 / minute   Peak B/P  =  190/64   Target HR =  122   METS =  8.3   Achieved HR  100 %    BASELINE EKG: sinus rhythm    STRESS EKG: normal EKG, normal sinus rhythm    SPECT IMAGE INTERPRETATION    Image Quality: excellent.    Tomographic views in multiple axes show normal perfusion.     Artifact Suspected: none.      GATED SPECT IMAGE INTERPRETATION:     Left Ventricular Size: normal.  Transient Ischemic Dilatation: not present.  Gated cine images: normal wall motion.    Ejection Fraction: 65%    CONCLUSION:   1. Stress EKG: Normal.   2. SPECT Perfusion Imaging: Normal Perfusion.   3. LV Systolic Function is normal.   4. Risk Assessment: Normal.    Comments:  Routine follow up recommended.      Doran Stabler, MD  09/06/2018  9:21 AM

## 2018-09-05 NOTE — Progress Notes (Signed)
Informed patient of nuke results.

## 2018-09-05 NOTE — Progress Notes (Signed)
Progress Notes by Doran Stabler, MD at 09/05/18 0715                Author: Doran Stabler, MD  Service: --  Author Type: Physician       Filed: 09/06/18 1438  Encounter Date: 09/05/2018  Status: Signed          Editor: Doran Stabler, MD (Physician)                           [facility]   [address]   [place] [address]   [phone]          NUCLEAR PERFUSION STUDY         Date: 09/05/2018         Name: Christopher Gonzalez   DOB: [birth year]   MRN: [id number]   Age: 77 y.o.   Sex: male        Ht Readings from Last 1 Encounters:        08/01/18  5\' 7"  (1.702 m)          Wt Readings from Last 1 Encounters:        08/01/18  165 lb (74.8 kg)           Referring Physician: Fenton Foy      Family Physician: Gregary Signs, MD      Ordering Physician: Fenton Foy, M.D.      Test Proctor: Zannie Cove, RN      TEST INDICATIONS: Delineation of extent and severity of coronary artery disease and elevated Ca+ score      CAD RISK FACTORS: Age: Man >45, hypertension, former smoker and hypercholesterolemia/hyperlipidemia      PERTINENT HISTORY: CAD      INFORMED CONSENT:  The nature of the procedure and its benefits were discussed with the patient.  Risks include, but are not limited to:  Reaction to radiopharmaceutical agent, reaction to the chemical stress agent (if used),  inability to complete the  study, arrhythmias, MI and death.  The patient had no further questions and agreed to proceed.       Stress test performed is a one day study with stress and rest performed on the same day.      SPECT IMAGING DOSAGE:  The patient was injected with 10.4 mCi of Cardiolite Tc10m intravenously at rest with delayed imaging. For stress imaging, the patient was injected with  30.7 mCi of Cardiolite  Tc53m intravenously at peak exercise with delayed imaging.      INJECTION SITE: Right hand.      BETA BLOCKER ASSESSMENT: Patient is not currently taking a beta blocker.      INDICATIONS  FOR PHARMACOLOGICAL TEST IF NEEDED: N/A .      TEST PROTOCOL: Bruce. The patient exercised into Stage III of the protocol. Total exercise time is 7 minutes.       SYMPTOMS: Patient did not complain of chest pain. .      TEST TERMINATION: The test was stopped due to adequate heart rate achieved, the following: dyspnea, fatigue.         STRESS TEST VITALS:       Resting HR =  70 / minute    Resting B/P =  130/70     Peak HR  =  144 / minute    Peak B/P  =  190/64    Target HR =  122    METS =  8.3    Achieved HR  100 %      BASELINE EKG: sinus rhythm      STRESS EKG: normal EKG, normal sinus rhythm      SPECT IMAGE INTERPRETATION      Image Quality: excellent.      Tomographic views in multiple axes show normal perfusion.       Artifact Suspected: none.         GATED SPECT IMAGE INTERPRETATION:       Left Ventricular Size: normal.   Transient Ischemic Dilatation: not present.   Gated cine images: normal wall motion.      Ejection Fraction: 65%      CONCLUSION:    1. Stress EKG: Normal.    2. SPECT Perfusion Imaging: Normal Perfusion.    3. LV Systolic Function is normal.    4. Risk Assessment: Normal.      Comments:  Routine follow up recommended.         Doran Stabler, MD   09/06/2018   9:21 AM

## 2018-09-14 ENCOUNTER — Ambulatory Visit: Attending: Cardiovascular Disease | Primary: Geriatric Medicine

## 2018-09-14 ENCOUNTER — Ambulatory Visit
Admit: 2018-09-14 | Discharge: 2018-09-14 | Payer: MEDICARE | Attending: Cardiovascular Disease | Primary: Geriatric Medicine

## 2018-09-14 DIAGNOSIS — E7800 Pure hypercholesterolemia, unspecified: Secondary | ICD-10-CM

## 2018-09-14 MED ORDER — ATORVASTATIN 20 MG TAB
20 mg | ORAL_TABLET | Freq: Every day | ORAL | 3 refills | Status: DC
Start: 2018-09-14 — End: 2019-07-09

## 2018-09-14 NOTE — Progress Notes (Signed)
[facility]  [address], [address]  [place] [address]  PHONE: [phone]    NAME: Christopher Gonzalez  DOB: 01-Oct-1941     HPI  Christopher Gonzalez is a 77 y.o. male seen for a follow up visit regarding   Hypertension (nuke results)      He is doing well with no chest pains or dyspnea.    Normal nuke stress.  We discussed his lipids and recommended pushing for LDL below 70 with his CCS over 900       Past Medical History, Past Surgical History, Family history, Social History, and Medications were all reviewed with the patient today and updated as necessary.     Outpatient Medications Marked as Taking for the 09/14/18 encounter (Office Visit) with Fenton Foy, MD   Medication Sig Dispense Refill   ??? atorvastatin (LIPITOR) 20 mg tablet Take 1 Tab by mouth daily. 90 Tab 3   ??? aspirin delayed-release 81 mg tablet Take 81 mg by mouth daily.     ??? sildenafil citrate (VIAGRA) 50 mg tablet Take 50 mg by mouth as needed.     ??? meclizine (ANTIVERT) 12.5 mg tablet Take  by mouth as needed.     ??? cyanocobalamin (VITAMIN B-12) 100 mcg tablet Take 100 mcg by mouth daily.     ??? pyridoxine HCl, vitamin B6, (VITAMIN B-6 PO) Take  by mouth daily.     ??? BIOTIN PO Take 5,000 mcg by mouth daily.     ??? turmeric (CURCUMIN) 250 mg by Does Not Apply route daily.     ??? multivitamin (ONE A DAY) tablet Take 1 Tab by mouth daily.     ??? s-adenosylmethionine (SAM-E) 200 mg tab Take 200 mg by mouth daily.     ??? tamsulosin (FLOMAX) 0.4 mg capsule Take 1 Cap by mouth two (2) times a day. 180 Cap 3     Patient Active Problem List    Diagnosis   ??? Hyperlipidemia   ??? Hypertension   ??? Impaired fasting glucose     Allergies   Allergen Reactions   ??? Codeine Nausea and Vomiting     Past Medical History:   Diagnosis Date   ??? Erectile dysfunction    ??? Hypercholesterolemia    ??? Personal history of prostate cancer    ??? Prostate cancer [facility])      Past Surgical History:   Procedure Laterality Date   ??? HX APPENDECTOMY      1950    ??? HX CHOLECYSTECTOMY     ??? HX HERNIA REPAIR     ??? HX MALIGNANT SKIN LESION EXCISION      basal cell - right cheek     Family History   Problem Relation Age of Onset   ??? Dementia Mother    ??? Stroke Father    ??? No Known Problems Brother    ??? No Known Problems Brother       Social History     Tobacco Use   ??? Smoking status: Former Smoker   ??? Smokeless tobacco: Never Used   Substance Use Topics   ??? Alcohol use: Yes     Alcohol/week: 2.0 standard drinks     Types: 1 Glasses of wine, 1 Shots of liquor per week           ROS            Wt Readings from Last 3 Encounters:   09/14/18 177 lb (80.3 kg)  08/01/18 165 lb (74.8 kg)   07/10/18 172 lb (78 kg)     BP Readings from Last 3 Encounters:   09/14/18 150/62   07/10/18 140/60   05/25/18 143/69       Visit Vitals  BP 150/62   Pulse 60   Ht 5' 7"  (1.702 m)   Wt 177 lb (80.3 kg)   BMI 27.72 kg/m??         Physical Exam   Constitutional: He appears well-developed and well-nourished.   HENT:   Head: Normocephalic.   Neck: No JVD present. Carotid bruit is not present.   Cardiovascular: Normal rate, regular rhythm and normal heart sounds.   Pulmonary/Chest: Effort normal and breath sounds normal.   Abdominal: Soft.   Musculoskeletal:         General: No edema.   Neurological: He is alert.   Skin: Skin is warm and dry.   Psychiatric: He has a normal mood and affect.       Medical problems and test results were reviewed with the patient today.     No results found for any visits on 09/14/18.      Lab Results   Component Value Date/Time    Hemoglobin A1c 5.7 (H) 05/22/2018 10:00 AM       No results found for: WBC, WBCLT, HGBPOC, HGB, HGBP, HCTPOC, HCT, PHCT, RBCH, PLT, MCV, HGBEXT, HCTEXT, PLTEXT, HGBEXT, HCTEXT, PLTEXT    Lab Results   Component Value Date/Time    Sodium 142 05/22/2018 10:00 AM    Potassium 4.7 05/22/2018 10:00 AM    Chloride 106 05/22/2018 10:00 AM    CO2 24 05/22/2018 10:00 AM    Glucose 85 05/22/2018 10:00 AM    BUN 20 05/22/2018 10:00 AM     Creatinine 0.89 05/22/2018 10:00 AM    BUN/Creatinine ratio 22 05/22/2018 10:00 AM    GFR est AA 96 05/22/2018 10:00 AM    GFR est non-AA 83 05/22/2018 10:00 AM    Calcium 9.1 05/22/2018 10:00 AM       Lab Results   Component Value Date/Time    ALT (SGPT) 18 05/22/2018 10:00 AM    AST (SGOT) 21 05/22/2018 10:00 AM    Alk. phosphatase 109 05/22/2018 10:00 AM    Bilirubin, total 0.5 05/22/2018 10:00 AM       Lab Results   Component Value Date/Time    Cholesterol, total 138 05/22/2018 10:00 AM    HDL Cholesterol 53 05/22/2018 10:00 AM    LDL, calculated 77 05/22/2018 10:00 AM    VLDL, calculated 8 05/22/2018 10:00 AM    Triglyceride 41 05/22/2018 10:00 AM         ASSESSMENT and PLAN    Diagnoses and all orders for this visit:    1. Pure hypercholesterolemia  work towards LDL below 70     2. Essential hypertension    home levels have been good   no changes     3. Dyslipidemia  -     atorvastatin (LIPITOR) 20 mg tablet; Take 1 Tab by mouth daily.    4   CAD by abnormal CCS   no angina or heart failure symptoms; continue current medical management and risk factor modification ; call for new or progressive symptoms                  Follow-up and Dispositions    ?? Return in about 1 year (around 09/15/2019).  Fenton Foy, MD  09/14/2018  10:16 AM

## 2018-09-14 NOTE — Progress Notes (Signed)
Progress  Notes by Fenton Foy, MD at 09/14/18 1000                Author: Fenton Foy, MD  Service: --  Author Type: Physician       Filed: 09/14/18 1033  Encounter Date: 09/14/2018  Status: Signed          Editor: Fenton Foy, MD (Physician)                         [facility]   [address], [address]   [place] [address]   PHONE: [phone]      NAME: Christopher Gonzalez   DOB: [birth year]       HPI   DAXTYN ROTTENBERG is a 77 y.o.  male seen for a follow up visit regarding    Hypertension (nuke results)       He is doing well with no chest pains or dyspnea.    Normal nuke stress.  We discussed his lipids and recommended pushing for LDL below 70 with his CCS over 900          Past Medical History, Past Surgical History, Family history, Social History, and Medications were all reviewed with the patient today and updated as necessary.         Outpatient Medications Marked as Taking for the 09/14/18 encounter (Office Visit) with Fenton Foy, MD          Medication  Sig  Dispense  Refill           ?  atorvastatin (LIPITOR) 20 mg tablet  Take 1 Tab by mouth daily.  90 Tab  3     ?  aspirin delayed-release 81 mg tablet  Take 81 mg by mouth daily.         ?  sildenafil citrate (VIAGRA) 50 mg tablet  Take 50 mg by mouth as needed.         ?  meclizine (ANTIVERT) 12.5 mg tablet  Take  by mouth as needed.         ?  cyanocobalamin (VITAMIN B-12) 100 mcg tablet  Take 100 mcg by mouth daily.         ?  pyridoxine HCl, vitamin B6, (VITAMIN B-6 PO)  Take  by mouth daily.         ?  BIOTIN PO  Take 5,000 mcg by mouth daily.         ?  turmeric (CURCUMIN)  250 mg by Does Not Apply route daily.         ?  multivitamin (ONE A DAY) tablet  Take 1 Tab by mouth daily.         ?  s-adenosylmethionine (SAM-E) 200 mg tab  Take 200 mg by mouth daily.               ?  tamsulosin (FLOMAX) 0.4 mg capsule  Take 1 Cap by mouth two (2) times a day.  180 Cap  3          Patient Active Problem List          Diagnosis         ?  Hyperlipidemia     ?  Hypertension        ?  Impaired fasting glucose          Allergies        Allergen  Reactions         ?  Codeine  Nausea and Vomiting          Past Medical History:        Diagnosis  Date         ?  Erectile dysfunction       ?  Hypercholesterolemia       ?  Personal history of prostate cancer           ?  Prostate cancer [facility])            Past Surgical History:         Procedure  Laterality  Date          ?  [place]          ?  HX CHOLECYSTECTOMY         ?  HX HERNIA REPAIR         ?  HX MALIGNANT SKIN LESION EXCISION              basal cell - right cheek          Family History         Problem  Relation  Age of Onset          ?  Dementia  Mother       ?  Stroke  Father       ?  No Known Problems  Brother            ?  No Known Problems  Brother             Social History          Tobacco Use         ?  Smoking status:  Former Smoker     ?  Smokeless tobacco:  Never Used       Substance Use Topics         ?  Alcohol use:  Yes              Alcohol/week:  2.0 standard drinks              Types:  1 Glasses of wine, 1 Shots of liquor per week                 ROS                   Wt Readings from Last 3 Encounters:        09/14/18  177 lb (80.3 kg)     08/01/18  165 lb (74.8 kg)        07/10/18  172 lb (78 kg)          BP Readings from Last 3 Encounters:        09/14/18  150/62     07/10/18  140/60        05/25/18  143/69           Visit Vitals      BP  150/62     Pulse  60     Ht  5' 7"  (1.702 m)     Wt  177 lb (80.3 kg)        BMI  27.72 kg/m??              Physical Exam    Constitutional: He appears well-developed and well-nourished.    HENT:    Head: Normocephalic.  Neck: No JVD present. Carotid bruit is not present.    Cardiovascular: Normal rate, regular rhythm and normal heart sounds.    Pulmonary/Chest: Effort normal and breath sounds normal.    Abdominal: Soft.    Musculoskeletal:          General: No edema.   Neurological: He is alert.    Skin: Skin is warm  and dry.   Psychiatric: He has a normal mood and affect.          Medical problems and test results were reviewed with the patient today.       No results found for any visits on 09/14/18.           Lab Results         Component  Value  Date/Time            Hemoglobin A1c  5.7 (H)  05/22/2018 10:00 AM           No results found for: WBC, WBCLT, HGBPOC, HGB, HGBP, HCTPOC, HCT, PHCT, RBCH, PLT, MCV, HGBEXT, HCTEXT, PLTEXT, HGBEXT, HCTEXT, PLTEXT        Lab Results         Component  Value  Date/Time            Sodium  142  05/22/2018 10:00 AM       Potassium  4.7  05/22/2018 10:00 AM       Chloride  106  05/22/2018 10:00 AM       CO2  24  05/22/2018 10:00 AM       Glucose  85  05/22/2018 10:00 AM       BUN  20  05/22/2018 10:00 AM       Creatinine  0.89  05/22/2018 10:00 AM       BUN/Creatinine ratio  22  05/22/2018 10:00 AM       GFR est AA  96  05/22/2018 10:00 AM       GFR est non-AA  83  05/22/2018 10:00 AM            Calcium  9.1  05/22/2018 10:00 AM             Lab Results         Component  Value  Date/Time            ALT (SGPT)  18  05/22/2018 10:00 AM       AST (SGOT)  21  05/22/2018 10:00 AM       Alk. phosphatase  109  05/22/2018 10:00 AM            Bilirubin, total  0.5  05/22/2018 10:00 AM             Lab Results         Component  Value  Date/Time            Cholesterol, total  138  05/22/2018 10:00 AM       HDL Cholesterol  53  05/22/2018 10:00 AM       LDL, calculated  77  05/22/2018 10:00 AM       VLDL, calculated  8  05/22/2018 10:00 AM            Triglyceride  41  05/22/2018 10:00 AM              ASSESSMENT and PLAN      Diagnoses and all orders for this visit:  1. Pure hypercholesterolemia  work towards LDL below 70       2. Essential hypertension    home levels have been good   no changes       3. Dyslipidemia   -     atorvastatin (LIPITOR) 20 mg tablet; Take 1 Tab by mouth daily.      4   CAD by abnormal CCS   no angina or heart failure symptoms; continue current medical management and risk  factor modification ; call for  new or progressive symptoms                            Follow-up and Dispositions      ??  Return in about 1 year (around 09/15/2019).                         Fenton Foy, MD   09/14/2018   10:16 AM

## 2018-11-19 ENCOUNTER — Encounter: Primary: Family Medicine

## 2018-11-26 ENCOUNTER — Encounter: Attending: Urology | Primary: Family Medicine

## 2018-11-28 ENCOUNTER — Encounter: Payer: MEDICARE | Attending: Family Medicine | Primary: Family Medicine

## 2018-11-28 ENCOUNTER — Encounter: Primary: Family Medicine

## 2018-11-29 LAB — PSA, DIAGNOSTIC (PROSTATE SPECIFIC AG): Prostate Specific Ag: 5.7 ng/mL — ABNORMAL HIGH (ref 0.0–4.0)

## 2018-11-29 LAB — PSA PROSTATIC SPECIFIC ANTIGEN: PSA: 5.7 ng/mL — ABNORMAL HIGH (ref 0.0–4.0)

## 2018-11-30 NOTE — Telephone Encounter (Signed)
Needs order placed for labs to be drawn downstairs.

## 2018-11-30 NOTE — Telephone Encounter (Signed)
Called pt and informed him that there was already lab orders on file. I also reminded him to be fasting when he went to have them drawn. He stated understanding.

## 2018-12-03 ENCOUNTER — Ambulatory Visit: Attending: Registered Nurse | Primary: Family Medicine

## 2018-12-03 ENCOUNTER — Ambulatory Visit: Admit: 2018-12-03 | Discharge: 2018-12-03 | Payer: MEDICARE | Attending: Registered Nurse | Primary: Family Medicine

## 2018-12-03 DIAGNOSIS — C61 Malignant neoplasm of prostate: Secondary | ICD-10-CM

## 2018-12-03 LAB — AMB POC URINALYSIS DIP STICK AUTO W/O MICRO (PGU)
Bilirubin (UA POC): NEGATIVE
Bilirubin, Urine, POC: NEGATIVE
Blood (UA POC): NEGATIVE
Blood (UA POC): NEGATIVE
Glucose (UA POC): NEGATIVE mg/dL
Glucose, Urine, POC: NEGATIVE mg/dL
KETONES, Urine, POC: NEGATIVE
Ketones (UA POC): NEGATIVE
Leukocyte Esterase, Urine, POC: NEGATIVE
Leukocyte esterase (UA POC): NEGATIVE
Nitrite, Urine, POC: NEGATIVE
Nitrites (UA POC): NEGATIVE
Protein (UA POC): NEGATIVE
Protein, Urine, POC: NEGATIVE
Specific Gravity, Urine, POC: 1.03 NA (ref 1.001–1.035)
Specific gravity (UA POC): 1.03 (ref 1.001–1.035)
Urobilinogen (POC): 0.2
Urobilinogen, POC: 0.2
pH (UA POC): 5.5 (ref 4.6–8.0)
pH, Urine, POC: 5.5 NA (ref 4.6–8.0)

## 2018-12-03 NOTE — Progress Notes (Signed)
[facility]  [address] [place] [address]              Christopher Gonzalez  DOB: [birth year]    Chief Complaint   Patient presents with   ??? Follow-up          HPI     Christopher Gonzalez is a 79 y.o. male moved here from [place], [place]. Had prostate cancer diagnosed in 4-18. MRI fusion biopsy then showed 1/12 biopsies with 3+3 prostate cancer in 10% of sample. Being managed with active surveillance. Previous negative biopsy in 2017. Had 102 gm prostate then.     PSA was 5.04 in 12-16 with right apical nodule.   PSA 5.87 in 11-18.   Takes??Tamsulosin 0.8 mg daily   He c/o low libido.     PSA 4.6 in 6-19. Testosterone normal at 428 IN 8-19.    Returns today for follow up of prostate CA. PSA now up to 5.7. he reports NO pain, gross hematuria, or unexplained weight loss. No LUTS. Doing well on Flomax. Does not need refill today.      Past Medical History:   Diagnosis Date   ??? Erectile dysfunction    ??? Hypercholesterolemia    ??? Personal history of prostate cancer    ??? Prostate cancer [facility])      Past Surgical History:   Procedure Laterality Date   ??? HX APPENDECTOMY      1950   ??? HX CHOLECYSTECTOMY     ??? HX HERNIA REPAIR     ??? 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 atorvastatin (LIPITOR) 20 mg tablet Take 1 Tab by mouth daily. 90 Tab 3   ??? aspirin delayed-release 81 mg tablet Take 81 mg by mouth daily.     ??? tadalafil (CIALIS) 20 mg tablet Take 20 mg by mouth as needed.     ??? sildenafil citrate (VIAGRA) 50 mg tablet Take 50 mg by mouth as needed.     ??? meclizine (ANTIVERT) 12.5 mg tablet Take  by mouth as needed.     ??? cyanocobalamin (VITAMIN B-12) 100 mcg tablet Take 100 mcg by mouth daily.     ??? pyridoxine HCl, vitamin B6, (VITAMIN B-6 PO) Take  by mouth daily.     ??? BIOTIN PO Take 5,000 mcg by mouth daily.     ??? turmeric (CURCUMIN) 250 mg by Does Not Apply route daily.      ??? multivitamin (ONE A DAY) tablet Take 1 Tab by mouth daily.     ??? s-adenosylmethionine (SAM-E) 200 mg tab Take 200 mg by mouth daily.     ??? tamsulosin (FLOMAX) 0.4 mg capsule Take 1 Cap by mouth two (2) times a day. 180 Cap 3     Allergies   Allergen Reactions   ??? Codeine Nausea and Vomiting     Social History     Socioeconomic History   ??? Marital status: DIVORCED     Spouse name: Not on file   ??? Number of children: Not on file   ??? Years of education: Not on file   ??? Highest education level: Not on file   Occupational History   ??? Not on file   Social Needs   ??? Financial resource strain: Not on file   ??? Food insecurity:  Worry: Not on file     Inability: Not on file   ??? Transportation needs:     Medical: Not on file     Non-medical: Not on file   Tobacco Use   ??? Smoking status: Former Smoker   ??? Smokeless tobacco: Never Used   Substance and Sexual Activity   ??? Alcohol use: Yes     Alcohol/week: 2.0 standard drinks     Types: 1 Glasses of wine, 1 Shots of liquor per week   ??? Drug use: Never   ??? Sexual activity: Not on file   Lifestyle   ??? Physical activity:     Days per week: Not on file     Minutes per session: Not on file   ??? Stress: Not on file   Relationships   ??? Social connections:     Talks on phone: Not on file     Gets together: Not on file     Attends religious service: Not on file     Active member of club or organization: Not on file     Attends meetings of clubs or organizations: Not on file     Relationship status: Not on file   ??? Intimate partner violence:     Fear of current or ex partner: Not on file     Emotionally abused: Not on file     Physically abused: Not on file     Forced sexual activity: Not on file   Other Topics Concern   ??? Not on file   Social History Narrative    Retired Actor - Pharmacist, hospital.  From [place], [place], moved to area from [place] History   Problem Relation Age of Onset   ??? Dementia Mother    ??? Stroke Father    ??? No Known Problems Brother     ??? No Known Problems Brother        Review of Systems  Constitutional:   Negative for fever.  GI:  Negative for nausea.  Musculoskeletal:  Negative for back pain.      Urinalysis  UA - Dipstick  Results for orders placed or performed in visit on 12/03/18   AMB POC URINALYSIS DIP STICK AUTO W/O MICRO (PGU)     Status: None   Result Value Ref Range Status    Glucose (UA POC) Negative Negative mg/dL Final    Bilirubin (UA POC) Negative Negative Final    Ketones (UA POC) Negative Negative Final    Specific gravity (UA POC) 1.030 1.001 - 1.035 Final    Blood (UA POC) Negative Negative Final    pH (UA POC) 5.5 4.6 - 8.0 Final    Protein (UA POC) Negative Negative Final    Urobilinogen (POC) 0.2 mg/dL  Final    Nitrites (UA POC) Negative Negative Final    Leukocyte esterase (UA POC) Negative Negative Final   Results for orders placed or performed in visit on 05/25/18   AMB POC URINALYSIS DIP STICK AUTO W/ MICRO (PGU)     Status: Abnormal   Result Value Ref Range Status    Glucose (UA POC) Negative Negative mg/dL Final    Bilirubin (UA POC) Negative Negative Final    Ketones (UA POC) Negative Negative Final    Specific gravity (UA POC) 1.015 1.001 - 1.035 Final    Blood (UA POC) Negative Negative Final    pH (UA POC) 8.5 (A) 4.6 - 8.0 Final  Protein (UA POC) Negative Negative Final    Urobilinogen (POC) 0.2 mg/dL  Final    Nitrites (UA POC) Negative Negative Final    Leukocyte esterase (UA POC) Negative Negative Final       UA - Micro  WBC - 0  RBC - 0  Bacteria - 0  Epith - 0    PHYSICAL EXAM    Visit Vitals  BP 166/76   Pulse 63   Temp 96.6 ??F (35.9 ??C) (Oral)   Wt 175 lb (79.4 kg)   BMI 27.41 kg/m??       General appearance - well appearing and in no distress  Mental status - alert, oriented to person, place, and time  Neck - supple, no significant adenopathy  Chest/Lung-  Quiet, even and easy respiratory effort without use of accessory muscles  Rectal/DRE- deferred    Neurological - normal speech, no focal findings or movement disorder noted on gross visual exam  Musculoskeletal - normal gait and station  Skin - normal coloration and turgor, no rashes    Assessment and Plan    ICD-10-CM ICD-9-CM    1. Malignant neoplasm of prostate (HCC) C61 185 AMB POC URINALYSIS DIP STICK AUTO W/O MICRO (PGU)     Urine normal. PSA stable at patients baseline. He prefers to continue active surveillance. Risks, benefits, and alternatives reviewed.    RTO in 6 months for follow up w Dr Delaney Meigs (PSA on week prior). Advised to call sooner if sx worsen.    Sanda Linger, FNP  Dr. Myrna Blazer is supervising physician today and he approves plan of care.

## 2018-12-03 NOTE — Progress Notes (Signed)
[facility]  [address] [place] [address]              Christopher Gonzalez  DOB: 1941-04-01    Chief Complaint   Patient presents with   ??? Follow-up          HPI     Christopher Gonzalez is a 78 y.o. male moved here from [place], [place]. Had prostate cancer diagnosed in 4-18. MRI fusion biopsy then showed 1/12 biopsies with 3+3 prostate cancer in 10% of sample. Being managed with active surveillance. Previous negative biopsy in 2017. Had 102 gm prostate then.     PSA was 5.04 in 12-16 with right apical nodule.   PSA 5.87 in 11-18.   Takes??Tamsulosin 0.8 mg daily   He c/o low libido.     PSA 4.6 in 6-19. Testosterone normal at 428 IN 8-19.    Returns today for follow up of prostate CA. PSA now up to 5.7. he reports NO pain, gross hematuria, or unexplained weight loss. No LUTS. Doing well on Flomax. Does not need refill today.      Past Medical History:   Diagnosis Date   ??? Erectile dysfunction    ??? Hypercholesterolemia    ??? Personal history of prostate cancer    ??? Prostate cancer [facility])      Past Surgical History:   Procedure Laterality Date   ??? HX APPENDECTOMY      1950   ??? HX CHOLECYSTECTOMY     ??? HX HERNIA REPAIR     ??? 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 atorvastatin (LIPITOR) 20 mg tablet Take 1 Tab by mouth daily. 90 Tab 3   ??? aspirin delayed-release 81 mg tablet Take 81 mg by mouth daily.     ??? tadalafil (CIALIS) 20 mg tablet Take 20 mg by mouth as needed.     ??? sildenafil citrate (VIAGRA) 50 mg tablet Take 50 mg by mouth as needed.     ??? meclizine (ANTIVERT) 12.5 mg tablet Take  by mouth as needed.     ??? cyanocobalamin (VITAMIN B-12) 100 mcg tablet Take 100 mcg by mouth daily.     ??? pyridoxine HCl, vitamin B6, (VITAMIN B-6 PO) Take  by mouth daily.     ??? BIOTIN PO Take 5,000 mcg by mouth daily.     ??? turmeric (CURCUMIN) 250 mg by Does Not Apply route daily.     ??? multivitamin (ONE A  DAY) tablet Take 1 Tab by mouth daily.     ??? s-adenosylmethionine (SAM-E) 200 mg tab Take 200 mg by mouth daily.     ??? tamsulosin (FLOMAX) 0.4 mg capsule Take 1 Cap by mouth two (2) times a day. 180 Cap 3     Allergies   Allergen Reactions   ??? Codeine Nausea and Vomiting     Social History     Socioeconomic History   ??? Marital status: DIVORCED     Spouse name: Not on file   ??? Number of children: Not on file   ??? Years of education: Not on file   ??? Highest education level: Not on file   Occupational History   ??? Not on file   Social Needs   ??? Financial resource strain: Not on file   ??? Food insecurity:  Worry: Not on file     Inability: Not on file   ??? Transportation needs:     Medical: Not on file     Non-medical: Not on file   Tobacco Use   ??? Smoking status: Former Smoker   ??? Smokeless tobacco: Never Used   Substance and Sexual Activity   ??? Alcohol use: Yes     Alcohol/week: 2.0 standard drinks     Types: 1 Glasses of wine, 1 Shots of liquor per week   ??? Drug use: Never   ??? Sexual activity: Not on file   Lifestyle   ??? Physical activity:     Days per week: Not on file     Minutes per session: Not on file   ??? Stress: Not on file   Relationships   ??? Social connections:     Talks on phone: Not on file     Gets together: Not on file     Attends religious service: Not on file     Active member of club or organization: Not on file     Attends meetings of clubs or organizations: Not on file     Relationship status: Not on file   ??? Intimate partner violence:     Fear of current or ex partner: Not on file     Emotionally abused: Not on file     Physically abused: Not on file     Forced sexual activity: Not on file   Other Topics Concern   ??? Not on file   Social History Narrative    Retired Actor - Pharmacist, hospital.  From [place], [place], moved to area from [place] History   Problem Relation Age of Onset   ??? Dementia Mother    ??? Stroke Father    ??? No Known Problems Brother    ??? No Known Problems Brother         Review of Systems  Constitutional:   Negative for fever.  GI:  Negative for nausea.  Musculoskeletal:  Negative for back pain.      Urinalysis  UA - Dipstick  Results for orders placed or performed in visit on 12/03/18   AMB POC URINALYSIS DIP STICK AUTO W/O MICRO (PGU)     Status: None   Result Value Ref Range Status    Glucose (UA POC) Negative Negative mg/dL Final    Bilirubin (UA POC) Negative Negative Final    Ketones (UA POC) Negative Negative Final    Specific gravity (UA POC) 1.030 1.001 - 1.035 Final    Blood (UA POC) Negative Negative Final    pH (UA POC) 5.5 4.6 - 8.0 Final    Protein (UA POC) Negative Negative Final    Urobilinogen (POC) 0.2 mg/dL  Final    Nitrites (UA POC) Negative Negative Final    Leukocyte esterase (UA POC) Negative Negative Final   Results for orders placed or performed in visit on 05/25/18   AMB POC URINALYSIS DIP STICK AUTO W/ MICRO (PGU)     Status: Abnormal   Result Value Ref Range Status    Glucose (UA POC) Negative Negative mg/dL Final    Bilirubin (UA POC) Negative Negative Final    Ketones (UA POC) Negative Negative Final    Specific gravity (UA POC) 1.015 1.001 - 1.035 Final    Blood (UA POC) Negative Negative Final    pH (UA POC) 8.5 (A) 4.6 - 8.0 Final  Protein (UA POC) Negative Negative Final    Urobilinogen (POC) 0.2 mg/dL  Final    Nitrites (UA POC) Negative Negative Final    Leukocyte esterase (UA POC) Negative Negative Final       UA - Micro  WBC - 0  RBC - 0  Bacteria - 0  Epith - 0    PHYSICAL EXAM    Visit Vitals  BP 166/76   Pulse 63   Temp 96.6 ??F (35.9 ??C) (Oral)   Wt 175 lb (79.4 kg)   BMI 27.41 kg/m??       General appearance - well appearing and in no distress  Mental status - alert, oriented to person, place, and time  Neck - supple, no significant adenopathy  Chest/Lung-  Quiet, even and easy respiratory effort without use of accessory muscles  Rectal/DRE- deferred   Neurological - normal speech, no focal findings or movement disorder noted on  gross visual exam  Musculoskeletal - normal gait and station  Skin - normal coloration and turgor, no rashes    Assessment and Plan    ICD-10-CM ICD-9-CM    1. Malignant neoplasm of prostate (HCC) C61 185 AMB POC URINALYSIS DIP STICK AUTO W/O MICRO (PGU)     Urine normal. PSA stable at patients baseline. He prefers to continue active surveillance. Risks, benefits, and alternatives reviewed.    RTO in 6 months for follow up w Dr Delaney Meigs (PSA on week prior). Advised to call sooner if sx worsen.    Sanda Linger, FNP  Dr. Myrna Blazer is supervising physician today and he approves plan of care.

## 2018-12-04 ENCOUNTER — Inpatient Hospital Stay: Admit: 2018-12-04 | Payer: MEDICARE | Primary: Family Medicine

## 2018-12-04 DIAGNOSIS — E7800 Pure hypercholesterolemia, unspecified: Secondary | ICD-10-CM

## 2018-12-04 LAB — LIPID PANEL
CHOL/HDL Ratio: 2.1
Chol/HDL Ratio: 2.1
Cholesterol, Total: 141 MG/DL (ref <200)
Cholesterol, total: 141 MG/DL (ref <200)
HDL Cholesterol: 68 MG/DL — ABNORMAL HIGH (ref 40–60)
HDL: 68 MG/DL — ABNORMAL HIGH (ref 40–60)
LDL Calculated: 61.8 MG/DL
LDL, calculated: 61.8 MG/DL
Triglyceride: 56 MG/DL (ref 35–150)
Triglycerides: 56 MG/DL (ref 35–150)
VLDL Cholesterol Calculated: 11.2 MG/DL (ref 6.0–23.0)
VLDL, calculated: 11.2 MG/DL (ref 6.0–23.0)

## 2018-12-04 LAB — COLLECTION COMMENT

## 2018-12-04 NOTE — Progress Notes (Signed)
Labs reviewed and OK  LIPIDS GOOD

## 2019-01-24 MED ORDER — TADALAFIL 20 MG TABLET
20 mg | ORAL_TABLET | ORAL | 5 refills | Status: DC | PRN
Start: 2019-01-24 — End: 2020-02-24

## 2019-01-24 NOTE — Progress Notes (Signed)
Refill per patient request.

## 2019-04-20 ENCOUNTER — Encounter

## 2019-04-25 MED ORDER — TAMSULOSIN SR 0.4 MG 24 HR CAP
0.4 mg | ORAL_CAPSULE | Freq: Two times a day (BID) | ORAL | 3 refills | Status: DC
Start: 2019-04-25 — End: 2019-06-07

## 2019-04-25 MED ORDER — TAMSULOSIN SR 0.4 MG 24 HR CAP
0.4 mg | ORAL_CAPSULE | Freq: Two times a day (BID) | ORAL | 3 refills | Status: DC
Start: 2019-04-25 — End: 2020-07-22

## 2019-05-02 IMAGING — CT CT HEAD W/O CM
3 series · 16 of 47 positions shown, 19 images · non-contrast
Comparison: None.

CLINICAL DATA: Dizziness, nausea and vomiting beginning at 2255
hours. Headache.

EXAM:
CT HEAD WITHOUT CONTRAST
TECHNIQUE: Contiguous axial images were obtained from the base of the skull
through the vertex without intravenous contrast.

[Series 3: head 5.0 h30s · axial · 0.42mm/px · z∈[+1121,+1256]mm · 10 of 33 slices shown, 13 images]
[im 3/33  brain]
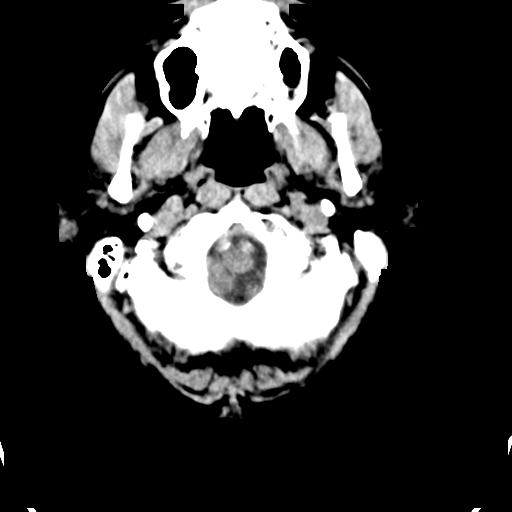
[im 3/33  bone]
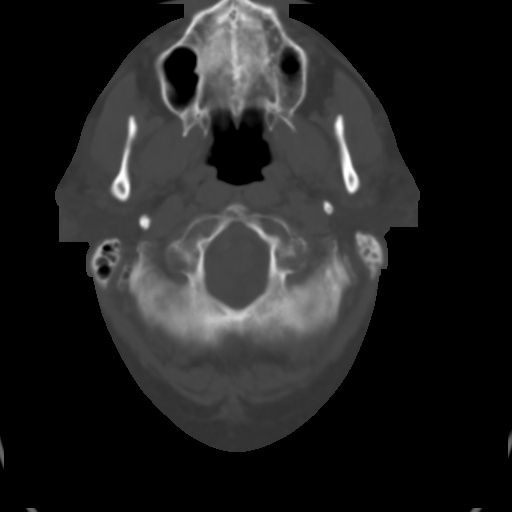
[im 6/33  brain]
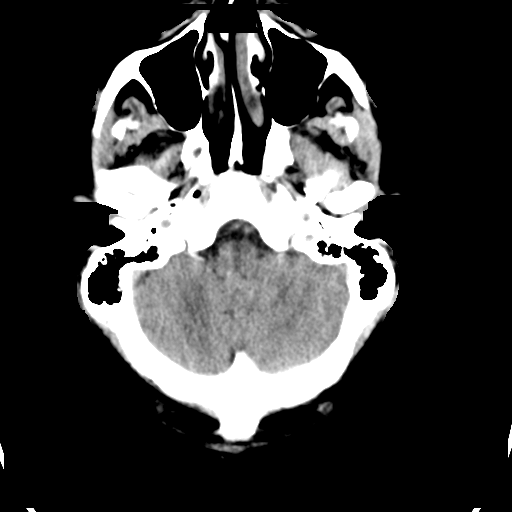
[im 9/33  brain]
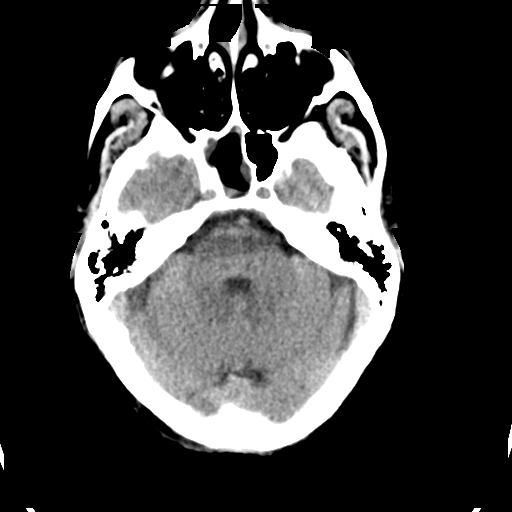
[im 12/33  brain]
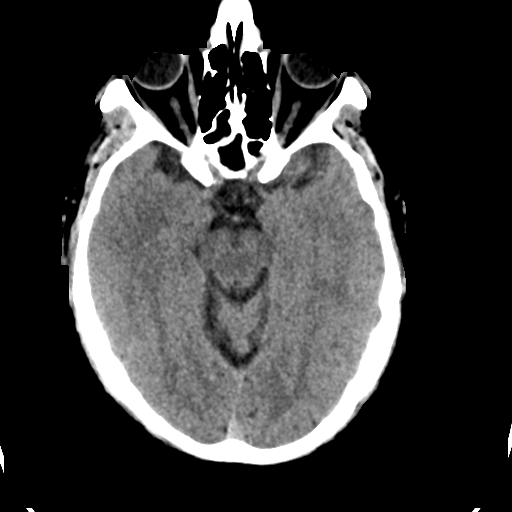
[im 15/33  brain]
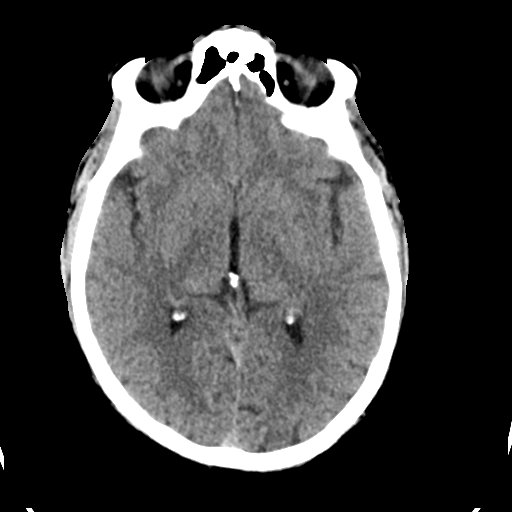
[im 15/33  bone]
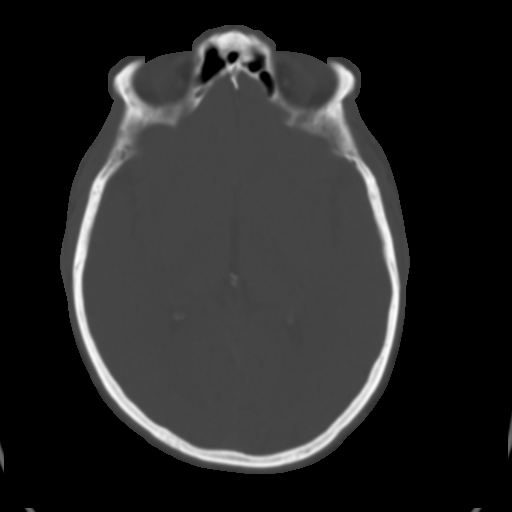
[im 18/33  brain]
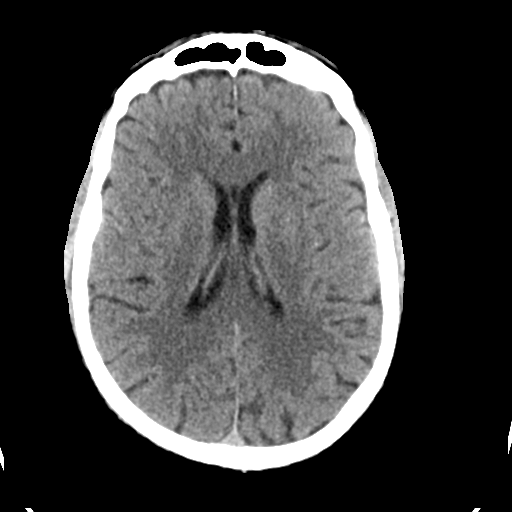
[im 21/33  brain]
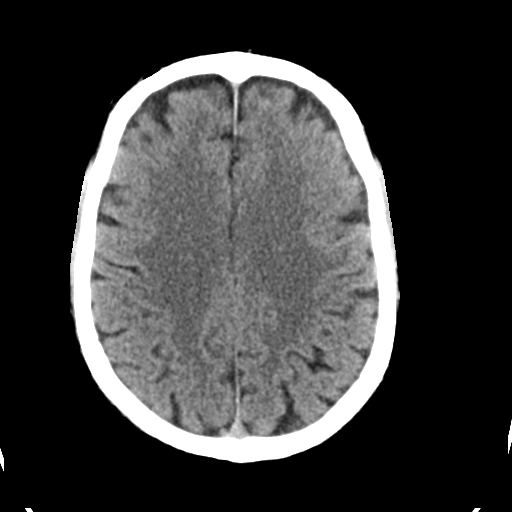
[im 25/33  brain]
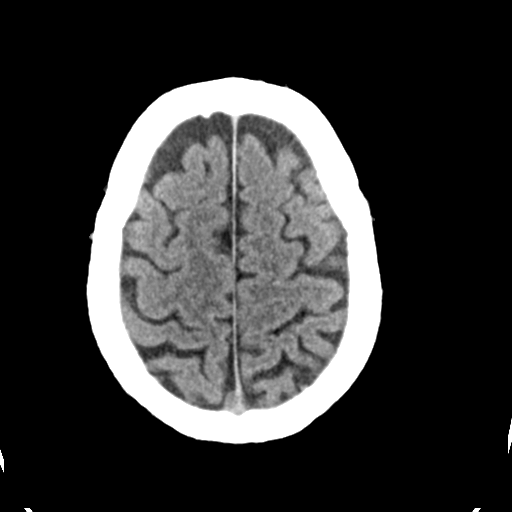
[im 27/33  brain]
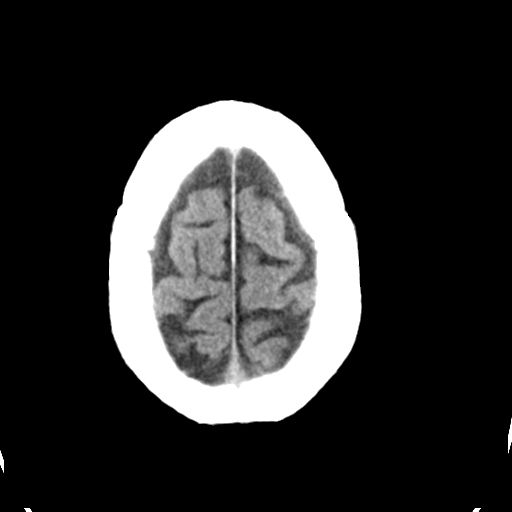
[im 27/33  bone]
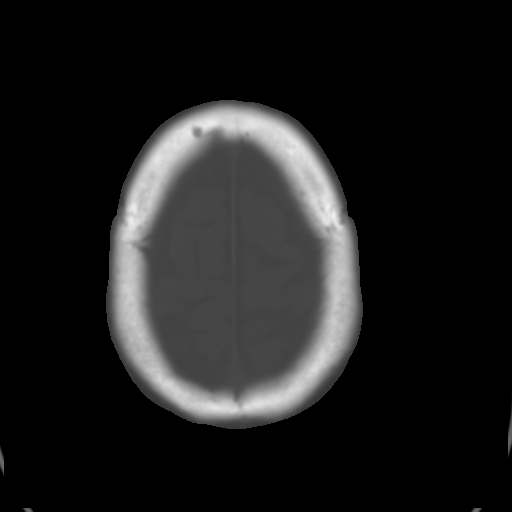
[im 30/33  brain]
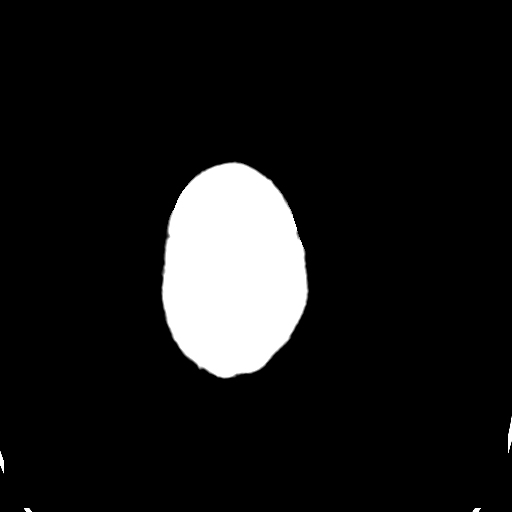

[Series 5: head 3.0 mpr cor · coronal · 0.31mm/px · 3 of 72 slices shown]
[im 24/72  brain]
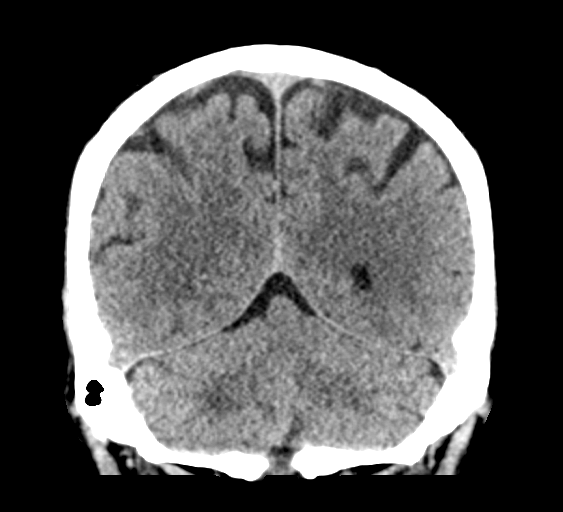
[im 32/72  brain]
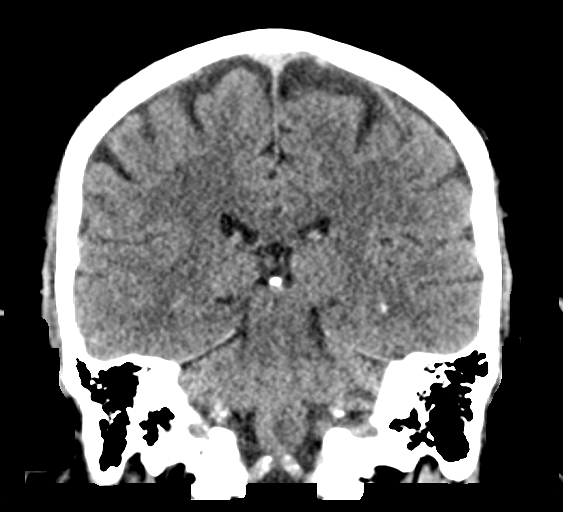
[im 40/72  brain]
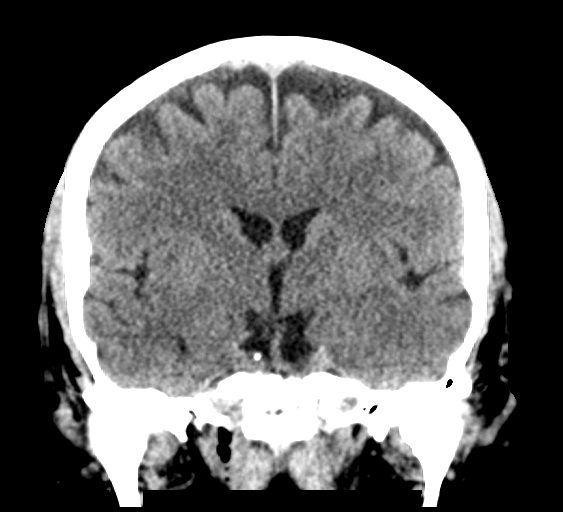

[Series 6: head 3.0 mpr sag · sagittal · 0.34mm/px · 3 of 67 slices shown]
[im 23/67  brain]
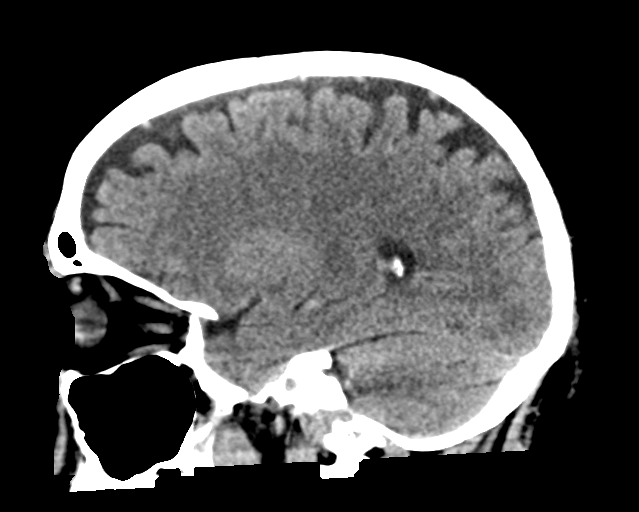
[im 34/67  brain]
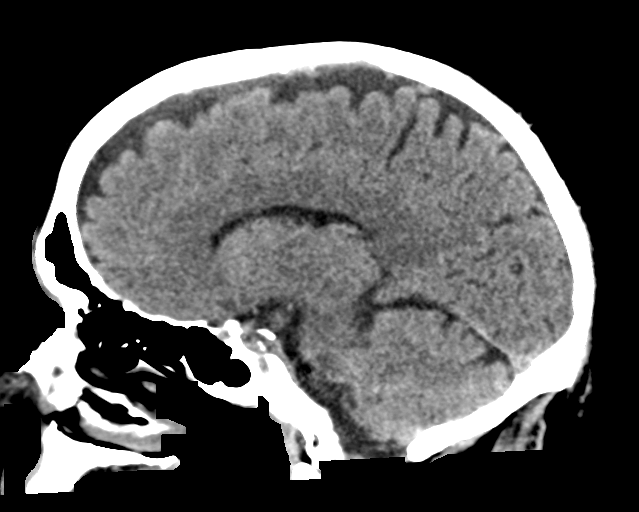
[im 45/67  brain]
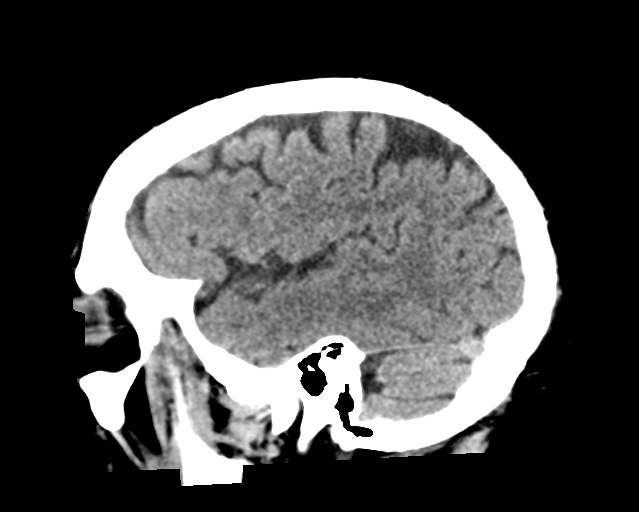

[16 of 47 positions shown; findings below may reference images not displayed]

FINDINGS: BRAIN: No intraparenchymal hemorrhage, mass effect nor midline
shift. The ventricles and sulci are normal for age. No acute large
vascular territory infarcts. No abnormal extra-axial fluid
collections. Basal cisterns are patent.

VASCULAR: Trace calcific atherosclerosis of the carotid siphons.

SKULL: No skull fracture. No significant scalp soft tissue swelling.

SINUSES/ORBITS: Mild paranasal sinus mucosal thickening. Mastoid air
cells are well aerated. The included ocular globes and orbital
contents are non-suspicious.

OTHER: None.
IMPRESSION: Normal CT HEAD for age.

## 2019-05-31 ENCOUNTER — Encounter: Admit: 2019-05-31 | Discharge: 2019-05-31 | Payer: MEDICARE | Primary: Family Medicine

## 2019-05-31 NOTE — Progress Notes (Signed)
Will review with Dr Delaney Meigs.

## 2019-06-01 LAB — PSA, DIAGNOSTIC (PROSTATE SPECIFIC AG): Prostate Specific Ag: 8.3 ng/mL — ABNORMAL HIGH (ref 0.0–4.0)

## 2019-06-01 LAB — PSA PROSTATIC SPECIFIC ANTIGEN: PSA: 8.3 ng/mL — ABNORMAL HIGH (ref 0.0–4.0)

## 2019-06-07 ENCOUNTER — Ambulatory Visit: Attending: Urology | Primary: Family Medicine

## 2019-06-07 ENCOUNTER — Ambulatory Visit: Admit: 2019-06-07 | Discharge: 2019-06-07 | Payer: MEDICARE | Attending: Urology | Primary: Family Medicine

## 2019-06-07 DIAGNOSIS — N401 Enlarged prostate with lower urinary tract symptoms: Secondary | ICD-10-CM

## 2019-06-07 LAB — AMB POC URINALYSIS DIP STICK AUTO W/ MICRO (PGU)
Bilirubin (UA POC): NEGATIVE
Bilirubin, Urine, POC: NEGATIVE
Blood (UA POC): NEGATIVE
Blood (UA POC): NEGATIVE
Glucose (UA POC): NEGATIVE mg/dL
Glucose, Urine, POC: NEGATIVE mg/dL
KETONES, Urine, POC: NEGATIVE
Ketones (UA POC): NEGATIVE
Leukocyte Esterase, Urine, POC: NEGATIVE
Leukocyte esterase (UA POC): NEGATIVE
Nitrite, Urine, POC: NEGATIVE
Nitrites (UA POC): NEGATIVE
Protein (UA POC): NEGATIVE
Protein, Urine, POC: NEGATIVE
Specific Gravity, Urine, POC: 1.005 NA (ref 1.001–1.035)
Specific gravity (UA POC): 1.005 (ref 1.001–1.035)
Urobilinogen (POC): 0.2
Urobilinogen, POC: 0.2
pH (UA POC): 6 (ref 4.6–8.0)
pH, Urine, POC: 6 NA (ref 4.6–8.0)

## 2019-06-07 NOTE — Progress Notes (Signed)
[facility]  [address] [place] [address]  [phone]    Christopher Gonzalez  DOB: 09/23/1941    Chief Complaint   Patient presents with   ??? Benign Prostatic Hypertrophy   ??? Prostate Cancer        HPI     Christopher Gonzalez is a 78 y.o. male  moved here from [place], [place]. Had??prostate cancer diagnosed in 4-18.??MRI fusion biopsy then showed 1/12 biopsies with 3+3 prostate cancer in 10% of sample. Being managed with active surveillance.??Previous negative biopsy in 2017. Had 102 gm prostate then.   ??  PSA was 5.04 in 12-16 with right apical nodule.   PSA 5.87 in 11-18.   Takes??Tamsulosin 0.8 mg daily   He c/o low libido.   ??  PSA 4.6 in 6-19. Testosterone normal at 428 IN 8-19.  ??  In 2-20, PSA  5.7.  Doing well on Flomax.   PSA 8.3 in 8-20    Past Medical History:   Diagnosis Date   ??? Erectile dysfunction    ??? Hypercholesterolemia    ??? Personal history of prostate cancer    ??? Prostate cancer [facility])      Past Surgical History:   Procedure Laterality Date   ??? HX APPENDECTOMY      1950   ??? HX CHOLECYSTECTOMY     ??? HX HERNIA REPAIR     ??? 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 tamsulosin (Flomax) 0.4 mg capsule Take 1 Cap by mouth two (2) times a day. (Patient taking differently: Take 0.8 mg by mouth daily.) 180 Cap 3   ??? tadalafiL (CIALIS) 20 mg tablet Take 1 Tab by mouth as needed for Erectile Dysfunction. 30 Tab 5   ??? atorvastatin (LIPITOR) 20 mg tablet Take 1 Tab by mouth daily. 90 Tab 3   ??? aspirin delayed-release 81 mg tablet Take 81 mg by mouth daily.     ??? meclizine (ANTIVERT) 12.5 mg tablet Take  by mouth as needed.     ??? cyanocobalamin (VITAMIN B-12) 100 mcg tablet Take 100 mcg by mouth daily.     ??? pyridoxine HCl, vitamin B6, (VITAMIN B-6 PO) Take  by mouth daily.     ??? BIOTIN PO Take 5,000 mcg by mouth daily.     ??? turmeric (CURCUMIN) 250 mg by Does Not Apply route daily.      ??? multivitamin (ONE A DAY) tablet Take 1 Tab by mouth daily.     ??? s-adenosylmethionine (SAM-E) 200 mg tab Take 200 mg by mouth daily.       Allergies   Allergen Reactions   ??? Codeine Nausea and Vomiting     Social History     Socioeconomic History   ??? Marital status: DIVORCED     Spouse name: Not on file   ??? Number of children: Not on file   ??? Years of education: Not on file   ??? Highest education level: Not on file   Occupational History   ??? Not on file   Social Needs   ??? Financial resource strain: Not on file   ??? Food insecurity     Worry: Not on file     Inability: Not on file   ??? Transportation needs     Medical: Not on file     Non-medical: Not  on file   Tobacco Use   ??? Smoking status: Former Smoker   ??? Smokeless tobacco: Never Used   Substance and Sexual Activity   ??? Alcohol use: Yes     Alcohol/week: 2.0 standard drinks     Types: 1 Glasses of wine, 1 Shots of liquor per week   ??? Drug use: Never   ??? Sexual activity: Not on file   Lifestyle   ??? Physical activity     Days per week: Not on file     Minutes per session: Not on file   ??? Stress: Not on file   Relationships   ??? Social Product manager on phone: Not on file     Gets together: Not on file     Attends religious service: Not on file     Active member of club or organization: Not on file     Attends meetings of clubs or organizations: Not on file     Relationship status: Not on file   ??? Intimate partner violence     Fear of current or ex partner: Not on file     Emotionally abused: Not on file     Physically abused: Not on file     Forced sexual activity: Not on file   Other Topics Concern   ??? 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 Dementia Mother    ??? Stroke Father    ??? No Known Problems Brother    ??? No Known Problems Brother        Review of Systems   Constitutional:   Negative for fever and headaches.  ENT:  Negative for high frequency hearing loss.  Respiratory:  Negative for shortness of breath.  Cardiovascular:  Negative for chest pain.  GI:  Negative for abdominal pain.  Genitourinary:  Negative for urinary burning and hematuria.  Musculoskeletal:  Negative for back pain.  Neurological:  Negative for numbness.  Psychological:  Negative for depression.  Endocrine:  Negative for fatigue.  Hem/Lymphatic:  Negative for easy bruising.      Visit Vitals  BP 150/71   Pulse 63   Temp 98.4 ??F (36.9 ??C) (Temporal)     PE: prostate: 1+, firm area right medial base.   Urinalysis  UA - Dipstick  Results for orders placed or performed in visit on 06/07/19   AMB POC URINALYSIS DIP STICK AUTO W/ MICRO (PGU)     Status: None   Result Value Ref Range Status    Glucose (UA POC) Negative Negative mg/dL Final    Bilirubin (UA POC) Negative Negative Final    Ketones (UA POC) Negative Negative Final    Specific gravity (UA POC) 1.005 1.001 - 1.035 Final    Blood (UA POC) Negative Negative Final    pH (UA POC) 6.0 4.6 - 8.0 Final    Protein (UA POC) Negative Negative Final    Urobilinogen (POC) 0.2 mg/dL  Final    Nitrites (UA POC) Negative Negative Final    Leukocyte esterase (UA POC) Negative Negative Final   Results for orders placed or performed in visit on 12/03/18   AMB POC URINALYSIS DIP STICK AUTO W/O MICRO (PGU)     Status: None   Result Value Ref Range Status  Glucose (UA POC) Negative Negative mg/dL Final    Bilirubin (UA POC) Negative Negative Final    Ketones (UA POC) Negative Negative Final    Specific gravity (UA POC) 1.030 1.001 - 1.035 Final    Blood (UA POC) Negative Negative Final    pH (UA POC) 5.5 4.6 - 8.0 Final    Protein (UA POC) Negative Negative Final    Urobilinogen (POC) 0.2 mg/dL  Final    Nitrites (UA POC) Negative Negative Final    Leukocyte esterase (UA POC) Negative Negative Final       UA - Micro  WBC - 0  RBC - 0  Bacteria - 0  Epith - 0     Physical Exam    Assessment and Plan    ICD-10-CM ICD-9-CM    1. Benign localized prostatic hyperplasia with lower urinary tract symptoms (LUTS)  N40.1 600.21 AMB POC URINALYSIS DIP STICK AUTO W/ MICRO (PGU)     599.69    2. Malignant neoplasm of prostate (HCC)  C61 185 AMB POC URINALYSIS DIP STICK AUTO W/ MICRO (PGU)      MRI PELV W WO CONT       Orders Placed This Encounter   ??? MRI PELV W WO CONT     Standing Status:   Future     Standing Expiration Date:   07/06/2020     Order Specific Question:   STAT Creatinine as indicated     Answer:   Yes     Order Specific Question:   Reason for Exam     Answer:   rising PSA on surveillance   ??? AMB POC URINALYSIS DIP STICK AUTO W/ MICRO (PGU)   Call patient with results  Rising PSA on surveillance. Will get MRI. May need MRI fusion biopsy.     Follow-up and Dispositions    ?? Return for Will call for appt.

## 2019-06-07 NOTE — Progress Notes (Signed)
[facility]  [address] [place] [address]  [phone]    Christopher Gonzalez  DOB: [birth year]    Chief Complaint   Patient presents with   ??? Benign Prostatic Hypertrophy   ??? Prostate Cancer        HPI     Christopher Gonzalez is a 78 y.o. male  moved here from [place], [place]. Had??prostate cancer diagnosed in 4-18.??MRI fusion biopsy then showed 1/12 biopsies with 3+3 prostate cancer in 10% of sample. Being managed with active surveillance.??Previous negative biopsy in 2017. Had 102 gm prostate then.   ??  PSA was 5.04 in 12-16 with right apical nodule.   PSA 5.87 in 11-18.   Takes??Tamsulosin 0.8 mg daily   He c/o low libido.   ??  PSA 4.6 in 6-19. Testosterone normal at 428 IN 8-19.  ??  In 2-20, PSA  5.7.  Doing well on Flomax.   PSA 8.3 in 8-20    Past Medical History:   Diagnosis Date   ??? Erectile dysfunction    ??? Hypercholesterolemia    ??? Personal history of prostate cancer    ??? Prostate cancer [facility])      Past Surgical History:   Procedure Laterality Date   ??? HX APPENDECTOMY      1950   ??? HX CHOLECYSTECTOMY     ??? HX HERNIA REPAIR     ??? 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 tamsulosin (Flomax) 0.4 mg capsule Take 1 Cap by mouth two (2) times a day. (Patient taking differently: Take 0.8 mg by mouth daily.) 180 Cap 3   ??? tadalafiL (CIALIS) 20 mg tablet Take 1 Tab by mouth as needed for Erectile Dysfunction. 30 Tab 5   ??? atorvastatin (LIPITOR) 20 mg tablet Take 1 Tab by mouth daily. 90 Tab 3   ??? aspirin delayed-release 81 mg tablet Take 81 mg by mouth daily.     ??? meclizine (ANTIVERT) 12.5 mg tablet Take  by mouth as needed.     ??? cyanocobalamin (VITAMIN B-12) 100 mcg tablet Take 100 mcg by mouth daily.     ??? pyridoxine HCl, vitamin B6, (VITAMIN B-6 PO) Take  by mouth daily.     ??? BIOTIN PO Take 5,000 mcg by mouth daily.     ??? turmeric (CURCUMIN) 250 mg by Does Not Apply route daily.     ??? multivitamin (ONE A  DAY) tablet Take 1 Tab by mouth daily.     ??? s-adenosylmethionine (SAM-E) 200 mg tab Take 200 mg by mouth daily.       Allergies   Allergen Reactions   ??? Codeine Nausea and Vomiting     Social History     Socioeconomic History   ??? Marital status: DIVORCED     Spouse name: Not on file   ??? Number of children: Not on file   ??? Years of education: Not on file   ??? Highest education level: Not on file   Occupational History   ??? Not on file   Social Needs   ??? Financial resource strain: Not on file   ??? Food insecurity     Worry: Not on file     Inability: Not on file   ??? Transportation needs     Medical: Not on file     Non-medical: Not  on file   Tobacco Use   ??? Smoking status: Former Smoker   ??? Smokeless tobacco: Never Used   Substance and Sexual Activity   ??? Alcohol use: Yes     Alcohol/week: 2.0 standard drinks     Types: 1 Glasses of wine, 1 Shots of liquor per week   ??? Drug use: Never   ??? Sexual activity: Not on file   Lifestyle   ??? Physical activity     Days per week: Not on file     Minutes per session: Not on file   ??? Stress: Not on file   Relationships   ??? Social Product manager on phone: Not on file     Gets together: Not on file     Attends religious service: Not on file     Active member of club or organization: Not on file     Attends meetings of clubs or organizations: Not on file     Relationship status: Not on file   ??? Intimate partner violence     Fear of current or ex partner: Not on file     Emotionally abused: Not on file     Physically abused: Not on file     Forced sexual activity: Not on file   Other Topics Concern   ??? 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 Dementia Mother    ??? Stroke Father    ??? No Known Problems Brother    ??? No Known Problems Brother        Review of Systems  Constitutional:   Negative for fever and headaches.  ENT:  Negative for  high frequency hearing loss.  Respiratory:  Negative for shortness of breath.  Cardiovascular:  Negative for chest pain.  GI:  Negative for abdominal pain.  Genitourinary:  Negative for urinary burning and hematuria.  Musculoskeletal:  Negative for back pain.  Neurological:  Negative for numbness.  Psychological:  Negative for depression.  Endocrine:  Negative for fatigue.  Hem/Lymphatic:  Negative for easy bruising.      Visit Vitals  BP 150/71   Pulse 63   Temp 98.4 ??F (36.9 ??C) (Temporal)     PE: prostate: 1+, firm area right medial base.   Urinalysis  UA - Dipstick  Results for orders placed or performed in visit on 06/07/19   AMB POC URINALYSIS DIP STICK AUTO W/ MICRO (PGU)     Status: None   Result Value Ref Range Status    Glucose (UA POC) Negative Negative mg/dL Final    Bilirubin (UA POC) Negative Negative Final    Ketones (UA POC) Negative Negative Final    Specific gravity (UA POC) 1.005 1.001 - 1.035 Final    Blood (UA POC) Negative Negative Final    pH (UA POC) 6.0 4.6 - 8.0 Final    Protein (UA POC) Negative Negative Final    Urobilinogen (POC) 0.2 mg/dL  Final    Nitrites (UA POC) Negative Negative Final    Leukocyte esterase (UA POC) Negative Negative Final   Results for orders placed or performed in visit on 12/03/18   AMB POC URINALYSIS DIP STICK AUTO W/O MICRO (PGU)     Status: None   Result Value Ref Range Status  Glucose (UA POC) Negative Negative mg/dL Final    Bilirubin (UA POC) Negative Negative Final    Ketones (UA POC) Negative Negative Final    Specific gravity (UA POC) 1.030 1.001 - 1.035 Final    Blood (UA POC) Negative Negative Final    pH (UA POC) 5.5 4.6 - 8.0 Final    Protein (UA POC) Negative Negative Final    Urobilinogen (POC) 0.2 mg/dL  Final    Nitrites (UA POC) Negative Negative Final    Leukocyte esterase (UA POC) Negative Negative Final       UA - Micro  WBC - 0  RBC - 0  Bacteria - 0  Epith - 0    Physical Exam    Assessment and Plan    ICD-10-CM ICD-9-CM    1. Benign  localized prostatic hyperplasia with lower urinary tract symptoms (LUTS)  N40.1 600.21 AMB POC URINALYSIS DIP STICK AUTO W/ MICRO (PGU)     599.69    2. Malignant neoplasm of prostate (HCC)  C61 185 AMB POC URINALYSIS DIP STICK AUTO W/ MICRO (PGU)      MRI PELV W WO CONT       Orders Placed This Encounter   ??? MRI PELV W WO CONT     Standing Status:   Future     Standing Expiration Date:   07/06/2020     Order Specific Question:   STAT Creatinine as indicated     Answer:   Yes     Order Specific Question:   Reason for Exam     Answer:   rising PSA on surveillance   ??? AMB POC URINALYSIS DIP STICK AUTO W/ MICRO (PGU)   Call patient with results  Rising PSA on surveillance. Will get MRI. May need MRI fusion biopsy.     Follow-up and Dispositions    ?? Return for Will call for appt.

## 2019-06-15 ENCOUNTER — Inpatient Hospital Stay: Admit: 2019-06-15 | Payer: MEDICARE | Attending: Urology | Primary: Family Medicine

## 2019-06-15 DIAGNOSIS — C61 Malignant neoplasm of prostate: Secondary | ICD-10-CM

## 2019-06-15 MED ORDER — SALINE PERIPHERAL FLUSH PRN
Freq: Once | INTRAMUSCULAR | Status: AC
Start: 2019-06-15 — End: 2019-06-15
  Administered 2019-06-15: 14:00:00

## 2019-06-15 MED ORDER — SODIUM CHLORIDE 0.9% BOLUS IV
0.9 % | Freq: Once | INTRAVENOUS | Status: AC
Start: 2019-06-15 — End: 2019-06-15
  Administered 2019-06-15: 14:00:00 via INTRAVENOUS

## 2019-06-15 MED ORDER — GADOTERATE MEGLUMINE 0.5 MMOL/ML IV SOLUTION
0.5 mmol/mL (376.9 mg/mL) | Freq: Once | INTRAVENOUS | Status: AC
Start: 2019-06-15 — End: 2019-06-15
  Administered 2019-06-15: 14:00:00 via INTRAVENOUS

## 2019-06-20 NOTE — Telephone Encounter (Addendum)
Message from [facility] sent at 06/20/2019 10:39 AM EDT -----  Patient would like call from provider on MRI results

## 2019-06-21 NOTE — Telephone Encounter (Signed)
Pt called appts made//dh

## 2019-06-21 NOTE — Telephone Encounter (Signed)
Called pt about his MRI, shows no suspicious areas  Needs appt in 3 months with PSA before

## 2019-07-02 ENCOUNTER — Encounter

## 2019-07-09 ENCOUNTER — Encounter

## 2019-07-09 MED ORDER — ATORVASTATIN 20 MG TAB
20 mg | ORAL_TABLET | Freq: Every day | ORAL | 3 refills | Status: DC
Start: 2019-07-09 — End: 2020-02-24

## 2019-07-09 NOTE — Telephone Encounter (Signed)
E-prescribed to pharmacy  Orders Placed This Encounter   ??? atorvastatin (LIPITOR) 20 mg tablet     Sig: Take 1 Tab by mouth daily.     Dispense:  90 Tab     Refill:  3

## 2019-08-21 ENCOUNTER — Ambulatory Visit: Attending: Cardiovascular Disease | Primary: Family Medicine

## 2019-08-21 ENCOUNTER — Ambulatory Visit
Admit: 2019-08-21 | Discharge: 2019-08-21 | Payer: MEDICARE | Attending: Cardiovascular Disease | Primary: Family Medicine

## 2019-08-21 ENCOUNTER — Inpatient Hospital Stay: Admit: 2019-08-21 | Payer: MEDICARE | Primary: Family Medicine

## 2019-08-21 DIAGNOSIS — I10 Essential (primary) hypertension: Secondary | ICD-10-CM

## 2019-08-21 DIAGNOSIS — E7800 Pure hypercholesterolemia, unspecified: Secondary | ICD-10-CM

## 2019-08-21 LAB — LIPID PANEL
CHOL/HDL Ratio: 2.3
Chol/HDL Ratio: 2.3
Cholesterol, Total: 140 MG/DL (ref <200)
Cholesterol, total: 140 MG/DL (ref <200)
HDL Cholesterol: 62 MG/DL — ABNORMAL HIGH (ref 40–60)
HDL: 62 MG/DL — ABNORMAL HIGH (ref 40–60)
LDL Calculated: 69.2 MG/DL (ref <100)
LDL, calculated: 69.2 MG/DL (ref <100)
Triglyceride: 44 MG/DL (ref 35–150)
Triglycerides: 44 MG/DL (ref 35–150)
VLDL Cholesterol Calculated: 8.8 MG/DL (ref 6.0–23.0)
VLDL, calculated: 8.8 MG/DL (ref 6.0–23.0)

## 2019-08-21 NOTE — Progress Notes (Signed)
[facility]  [address], [address]  [place] [address]  PHONE: [phone]    NAME: ZECHARIAH BISSONNETTE  DOB: 1941-09-02     HPI  Christopher Gonzalez is a 78 y.o. male seen for a follow up visit regarding   Hypertension (yearly )       He is doing well with no chest pains or dyspnea. Exercise program is stable and daily as is weight . He cannot manage to lose         Past Medical History, Past Surgical History, Family history, Social History, and Medications were all reviewed with the patient today and updated as necessary.       Patient Active Problem List    Diagnosis   ??? Agatston coronary artery calcium score greater than 400     939       2019     ??? Hyperlipidemia   ??? Hypertension   ??? Impaired fasting glucose     Allergies   Allergen Reactions   ??? Codeine Nausea and Vomiting     Past Medical History:   Diagnosis Date   ??? Erectile dysfunction    ??? Hypercholesterolemia    ??? Personal history of prostate cancer    ??? Prostate cancer [facility])      Past Surgical History:   Procedure Laterality Date   ??? HX APPENDECTOMY      1950   ??? HX CHOLECYSTECTOMY     ??? HX HERNIA REPAIR     ??? HX MALIGNANT SKIN LESION EXCISION      basal cell - right cheek     Family History   Problem Relation Age of Onset   ??? Dementia Mother    ??? Stroke Father    ??? No Known Problems Brother    ??? No Known Problems Brother       Social History     Tobacco Use   ??? Smoking status: Former Smoker   ??? Smokeless tobacco: Never Used   Substance Use Topics   ??? Alcohol use: Yes     Alcohol/week: 2.0 standard drinks     Types: 1 Glasses of wine, 1 Shots of liquor per week           ROS            Wt Readings from Last 3 Encounters:   08/21/19 176 lb 3.2 oz (79.9 kg)   06/15/19 170 lb (77.1 kg)   12/03/18 175 lb (79.4 kg)     BP Readings from Last 3 Encounters:   08/21/19 134/76   06/07/19 150/71   12/03/18 166/76       Visit Vitals  BP 134/76   Pulse 62   Ht 5' 7"  (1.702 m)   Wt 176 lb 3.2 oz (79.9 kg) Comment: with shoes   BMI 27.60 kg/m??          Physical Exam   Constitutional: He appears well-developed and well-nourished.   HENT:   Head: Normocephalic.   Neck: No JVD present. Carotid bruit is not present.   Cardiovascular: Normal rate, regular rhythm and normal heart sounds.   Pulmonary/Chest: Effort normal and breath sounds normal.   Abdominal: Soft.   Musculoskeletal:         General: No edema.   Neurological: He is alert.   Skin: Skin is warm and dry.   Psychiatric: He has a normal mood and affect.   Sinus  Rhythm 62bpm    QRS 92  borderline LAE   WITHIN NORMAL LIMITS    Medical problems and test results were reviewed with the patient today.     No results found for any visits on 08/21/19.      Lab Results   Component Value Date/Time    Hemoglobin A1c 5.7 (H) 05/22/2018 10:00 AM       No results found for: WBC, WBCLT, HGBPOC, HGB, HGBP, HCTPOC, HCT, PHCT, RBCH, PLT, MCV, HGBEXT, HCTEXT, PLTEXT, HGBEXT, HCTEXT, PLTEXT    Lab Results   Component Value Date/Time    Sodium 142 05/22/2018 10:00 AM    Potassium 4.7 05/22/2018 10:00 AM    Chloride 106 05/22/2018 10:00 AM    CO2 24 05/22/2018 10:00 AM    Glucose 85 05/22/2018 10:00 AM    BUN 20 05/22/2018 10:00 AM    Creatinine 0.89 05/22/2018 10:00 AM    BUN/Creatinine ratio 22 05/22/2018 10:00 AM    GFR est AA 96 05/22/2018 10:00 AM    GFR est non-AA 83 05/22/2018 10:00 AM    Calcium 9.1 05/22/2018 10:00 AM       Lab Results   Component Value Date/Time    ALT (SGPT) 18 05/22/2018 10:00 AM    Alk. phosphatase 109 05/22/2018 10:00 AM    Bilirubin, total 0.5 05/22/2018 10:00 AM       Lab Results   Component Value Date/Time    Cholesterol, total 141 12/04/2018 08:10 AM    HDL Cholesterol 68 (H) 12/04/2018 08:10 AM    LDL, calculated 61.8 12/04/2018 08:10 AM    VLDL, calculated 11.2 12/04/2018 08:10 AM    Triglyceride 56 12/04/2018 08:10 AM    CHOL/HDL Ratio 2.1 12/04/2018 08:10 AM         ASSESSMENT and PLAN    Diagnoses and all orders for this visit:     1. Essential hypertension  blood pressure in reasonable range continue current medications low salt diet and monitor for goal in 120/80 range             120/70 by me     -     AMB POC EKG ROUTINE W/ 12 LEADS, INTER & REP    2. Pure hypercholesterolemia   continue diet exercise lipids today   -     LIPID PANEL; Future    3. Agatston coronary artery calcium score greater than 400 no angina or heart failure symptoms; continue current medical management and risk factor modification ; call for new or progressive symptoms                        Follow-up and Dispositions    ?? Return in about 1 year (around 08/20/2020).               Fenton Foy, MD  08/21/2019  7:46 AM

## 2019-08-21 NOTE — Progress Notes (Signed)
Labs reviewed and good   Lipids remain at goal  no change in meds

## 2019-08-21 NOTE — Progress Notes (Signed)
Progress  Notes by Fenton Foy, MD at 08/21/19 0830                Author: Fenton Foy, MD  Service: --  Author Type: Physician       Filed: 08/21/19 0904  Encounter Date: 08/21/2019  Status: Signed          Editor: Fenton Foy, MD (Physician)                         [facility], [address]   [place] [address]   PHONE: [phone]      NAME: THIAGO RAGSDALE   DOB: [birth year]       HPI   Christopher Gonzalez is a 78 y.o.  male seen for a follow up visit regarding    Hypertension (yearly )        He is doing well with no chest pains or dyspnea. Exercise program is stable and daily as is weight . He cannot manage to lose            Past Medical History, Past Surgical History, Family history, Social History, and Medications were all reviewed with the patient today and updated as necessary.            Patient Active Problem List          Diagnosis        ?  Agatston coronary artery calcium score greater than 400             939       2019           ?  Hyperlipidemia     ?  Hypertension        ?  Impaired fasting glucose          Allergies        Allergen  Reactions         ?  Codeine  Nausea and Vomiting          Past Medical History:        Diagnosis  Date         ?  Erectile dysfunction       ?  Hypercholesterolemia       ?  Personal history of prostate cancer           ?  Prostate cancer [facility])            Past Surgical History:         Procedure  Laterality  Date          ?  [place]          ?  HX CHOLECYSTECTOMY         ?  HX HERNIA REPAIR         ?  HX MALIGNANT SKIN LESION EXCISION              basal cell - right cheek          Family History         Problem  Relation  Age of Onset          ?  Dementia  Mother       ?  Stroke  Father       ?  No Known Problems  Brother            ?  No Known Problems  Brother             Social History          Tobacco Use         ?  Smoking status:  Former Smoker     ?  Smokeless tobacco:  Never Used       Substance Use  Topics         ?  Alcohol use:  Yes              Alcohol/week:  2.0 standard drinks              Types:  1 Glasses of wine, 1 Shots of liquor per week                 ROS                   Wt Readings from Last 3 Encounters:        08/21/19  176 lb 3.2 oz (79.9 kg)     06/15/19  170 lb (77.1 kg)        12/03/18  175 lb (79.4 kg)          BP Readings from Last 3 Encounters:        08/21/19  134/76     06/07/19  150/71        12/03/18  166/76           Visit Vitals      BP  134/76     Pulse  62     Ht  5' 7"  (1.702 m)         Wt  176 lb 3.2 oz (79.9 kg)  Comment: with shoes        BMI  27.60 kg/m??              Physical Exam    Constitutional: He appears well-developed and well-nourished.    HENT:    Head: Normocephalic.    Neck: No JVD present. Carotid bruit is not present.    Cardiovascular: Normal rate, regular rhythm and normal heart sounds.    Pulmonary/Chest: Effort normal and breath sounds normal.    Abdominal: Soft.    Musculoskeletal:          General: No edema.   Neurological: He is alert.    Skin: Skin is warm and dry.   Psychiatric: He has a normal mood and affect.    Sinus  Rhythm 62bpm     QRS 92    borderline LAE    WITHIN NORMAL LIMITS      Medical problems and test results were reviewed with the patient today.       No results found for any visits on 08/21/19.           Lab Results         Component  Value  Date/Time            Hemoglobin A1c  5.7 (H)  05/22/2018 10:00 AM           No results found for: WBC, WBCLT, HGBPOC, HGB, HGBP, HCTPOC, HCT, PHCT, RBCH, PLT, MCV, HGBEXT, HCTEXT, PLTEXT, HGBEXT, HCTEXT, PLTEXT        Lab Results         Component  Value  Date/Time            Sodium  142  05/22/2018 10:00 AM  Potassium  4.7  05/22/2018 10:00 AM       Chloride  106  05/22/2018 10:00 AM       CO2  24  05/22/2018 10:00 AM       Glucose  85  05/22/2018 10:00 AM       BUN  20  05/22/2018 10:00 AM       Creatinine  0.89  05/22/2018 10:00 AM       BUN/Creatinine ratio  22  05/22/2018 10:00 AM        GFR est AA  96  05/22/2018 10:00 AM       GFR est non-AA  83  05/22/2018 10:00 AM            Calcium  9.1  05/22/2018 10:00 AM             Lab Results         Component  Value  Date/Time            ALT (SGPT)  18  05/22/2018 10:00 AM       Alk. phosphatase  109  05/22/2018 10:00 AM            Bilirubin, total  0.5  05/22/2018 10:00 AM             Lab Results         Component  Value  Date/Time            Cholesterol, total  141  12/04/2018 08:10 AM       HDL Cholesterol  68 (H)  12/04/2018 08:10 AM       LDL, calculated  61.8  12/04/2018 08:10 AM       VLDL, calculated  11.2  12/04/2018 08:10 AM       Triglyceride  56  12/04/2018 08:10 AM            CHOL/HDL Ratio  2.1  12/04/2018 08:10 AM              ASSESSMENT and PLAN      Diagnoses and all orders for this visit:      1. Essential hypertension  blood pressure in reasonable range continue current medications low salt diet and monitor for goal in 120/80 range              120/70 by me       -     AMB POC EKG ROUTINE W/ 12 LEADS, INTER & REP      2. Pure hypercholesterolemia   continue diet exercise lipids today    -     LIPID PANEL; Future      3. Agatston coronary artery calcium score greater than 400 no angina or heart failure symptoms; continue current medical management and risk  factor modification ; call for new or progressive symptoms                                     Follow-up and Dispositions      ??  Return in about 1 year (around 08/20/2020).                         Fenton Foy, MD   08/21/2019   7:46 AM

## 2019-08-21 NOTE — Progress Notes (Signed)
Pt notified

## 2019-08-22 NOTE — Progress Notes (Signed)
Pt notified

## 2019-09-09 ENCOUNTER — Other Ambulatory Visit: Admit: 2019-09-09 | Discharge: 2019-09-09 | Payer: MEDICARE | Primary: Family Medicine

## 2019-09-09 DIAGNOSIS — N401 Enlarged prostate with lower urinary tract symptoms: Secondary | ICD-10-CM

## 2019-09-10 LAB — PSA, DIAGNOSTIC (PROSTATE SPECIFIC AG): Prostate Specific Ag: 6.2 ng/mL — ABNORMAL HIGH (ref 0.0–4.0)

## 2019-09-10 LAB — PSA PROSTATIC SPECIFIC ANTIGEN: PSA: 6.2 ng/mL — ABNORMAL HIGH (ref 0.0–4.0)

## 2019-10-07 ENCOUNTER — Encounter: Attending: Urology | Primary: Family Medicine

## 2019-10-18 ENCOUNTER — Ambulatory Visit: Attending: Urology | Primary: Family Medicine

## 2019-10-18 ENCOUNTER — Ambulatory Visit: Admit: 2019-10-18 | Discharge: 2019-10-18 | Payer: MEDICARE | Attending: Urology | Primary: Family Medicine

## 2019-10-18 DIAGNOSIS — C61 Malignant neoplasm of prostate: Secondary | ICD-10-CM

## 2019-10-18 LAB — AMB POC URINALYSIS DIP STICK AUTO W/O MICRO (PGU)
Blood (UA POC): NEGATIVE
Blood (UA POC): NEGATIVE
Glucose (UA POC): NEGATIVE mg/dL
Glucose, Urine, POC: NEGATIVE mg/dL
KETONES, Urine, POC: NEGATIVE
Ketones (UA POC): NEGATIVE
Nitrite, Urine, POC: NEGATIVE
Nitrites (UA POC): NEGATIVE
Specific Gravity, Urine, POC: 1.02 NA (ref 1.001–1.035)
Specific gravity (UA POC): 1.02 (ref 1.001–1.035)
Urobilinogen (POC): 1
Urobilinogen, POC: 1
pH (UA POC): 5.5 (ref 4.6–8.0)
pH, Urine, POC: 5.5 NA (ref 4.6–8.0)

## 2019-10-18 NOTE — Progress Notes (Signed)
[facility]  [address] [place] [address]  [phone]    Christopher Gonzalez  DOB: 02/25/1941    Chief Complaint   Patient presents with   ??? Prostate Cancer   ??? Elevated PSA   ??? Erectile Dysfunction        HPI     Christopher Gonzalez is a 79 y.o. male ??moved here from [place], [place]. Had??prostate cancer diagnosed in 4-18.??MRI fusion biopsy then showed 1/12 biopsies with 3+3 prostate cancer in 10% of sample. Being managed with active surveillance.??Previous negative biopsy in 2017. Had 102 gm prostate then.   ??  PSA was 5.04 in 12-16 with right apical nodule.   PSA 5.87 in 11-18.   Takes??Tamsulosin 0.8 mg daily   He c/o low libido.   ??  PSA 4.6 in 6-19. Testosterone normal at 428 IN 8-19.  ??  In 2-20, PSA  5.7.  Doing well on Flomax.   PSA 8.3 in 8-20.   Prostate MRI 06-15-19: ??  FINDINGS:  PROSTATE SIZE: The gland measures 6.0 x 4.2 x 6.7 cm.  ??  PERIPHERAL GLAND: There is normal T2 signal within the peripheral zone of the  prostate.   ??  CENTRAL GLAND: Advanced changes of BPH. No focal transition zone lesion.  ??  CAPSULE: The prostatic capsule is maintained without evidence of metastatic  spread.  ??  PERINEURAL: The neurovascular bundles and posterolateral periprostatic fat are  normal without evidence of tumor extension.   ??  SEMINAL VESICLES: The seminal vesicles and ejaculatory ducts are normal.  ??  LYMPH NODES: No pelvic lymphadenopathy seen.  Limited imaging of the lower  abdomen shows no discrete adenopathy or free fluid.  ??  BONES: No aggressive osseous lesion.  ??  BLADDER and RECTUM: Preserved and grossly unremarkable.  ??  ??  IMPRESSION  IMPRESSION:  1. No discernible lesion seen. Capsule preserved.  2. No lymphadenopathy.  PSA 6.2 in 12-20.  No problems voiding.   Past Medical History:   Diagnosis Date   ??? Elevated PSA    ??? Erectile dysfunction    ??? Hypercholesterolemia    ??? Personal history of prostate cancer    ??? Prostate cancer [facility])      Past Surgical History:    Procedure Laterality Date   ??? HX APPENDECTOMY      1950   ??? HX CHOLECYSTECTOMY     ??? HX HERNIA REPAIR     ??? 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 atorvastatin (LIPITOR) 20 mg tablet Take 1 Tab by mouth daily. 90 Tab 3   ??? tamsulosin (Flomax) 0.4 mg capsule Take 1 Cap by mouth two (2) times a day. (Patient taking differently: Take 0.8 mg by mouth daily.) 180 Cap 3   ??? tadalafiL (CIALIS) 20 mg tablet Take 1 Tab by mouth as needed for Erectile Dysfunction. 30 Tab 5   ??? aspirin delayed-release 81 mg tablet Take 81 mg by mouth daily.     ??? meclizine (ANTIVERT) 12.5 mg tablet Take  by mouth as needed.     ??? cyanocobalamin (VITAMIN B-12) 100 mcg tablet Take 100 mcg by mouth daily.     ??? pyridoxine HCl, vitamin B6, (VITAMIN B-6 PO) Take  by mouth daily.     ??? BIOTIN PO Take 5,000 mcg by mouth  daily.     ??? turmeric (CURCUMIN) 250 mg by Does Not Apply route daily.     ??? multivitamin (ONE A DAY) tablet Take 1 Tab by mouth daily.     ??? s-adenosylmethionine (SAM-E) 200 mg tab Take 200 mg by mouth daily.       Allergies   Allergen Reactions   ??? Codeine Nausea and Vomiting     Social History     Socioeconomic History   ??? Marital status: DIVORCED     Spouse name: Not on file   ??? Number of children: Not on file   ??? Years of education: Not on file   ??? Highest education level: Not on file   Occupational History   ??? Not on file   Social Needs   ??? Financial resource strain: Not on file   ??? Food insecurity     Worry: Not on file     Inability: Not on file   ??? Transportation needs     Medical: Not on file     Non-medical: Not on file   Tobacco Use   ??? Smoking status: Former Smoker   ??? Smokeless tobacco: Never Used   Substance and Sexual Activity   ??? Alcohol use: Yes     Alcohol/week: 2.0 standard drinks     Types: 1 Glasses of wine, 1 Shots of liquor per week   ??? Drug use: Never   ??? Sexual activity: Not on file   Lifestyle    ??? Physical activity     Days per week: Not on file     Minutes per session: Not on file   ??? Stress: Not on file   Relationships   ??? Social Product manager on phone: Not on file     Gets together: Not on file     Attends religious service: Not on file     Active member of club or organization: Not on file     Attends meetings of clubs or organizations: Not on file     Relationship status: Not on file   ??? Intimate partner violence     Fear of current or ex partner: Not on file     Emotionally abused: Not on file     Physically abused: Not on file     Forced sexual activity: Not on file   Other Topics Concern   ??? Not on file   Social History Narrative    Retired Actor - Pharmacist, hospital.  From [place], [place], moved to area from [place] History   Problem Relation Age of Onset   ??? Dementia Mother    ??? Stroke Father    ??? No Known Problems Brother    ??? No Known Problems Brother        Review of Systems  Constitutional:   Negative for fever and headaches.  ENT:  Negative for high frequency hearing loss.  Respiratory:  Negative for shortness of breath.  Cardiovascular:  Negative for chest pain.  GI:  Negative for abdominal pain.  Genitourinary: Positive for erectile dysfunction. Negative for urinary burning and hematuria.  Musculoskeletal:  Negative for back pain.  Neurological:  Negative for numbness.  Psychological:  Negative for depression.  Endocrine:  Negative for fatigue.  Hem/Lymphatic:  Negative for easy bruising.      Visit Vitals  BP (!) 147/66   Pulse 63   Temp 97.2 ??F (36.2 ??C) (Temporal)  Urinalysis  UA - Dipstick  Results for orders placed or performed in visit on 10/18/19   AMB POC URINALYSIS DIP STICK AUTO W/O MICRO (PGU)     Status: None   Result Value Ref Range Status    Glucose (UA POC) Negative Negative mg/dL Final    Bilirubin (UA POC) Small Negative Final    Ketones (UA POC) Negative Negative Final    Specific gravity (UA POC) 1.020 1.001 - 1.035 Final     Blood (UA POC) Negative Negative Final    pH (UA POC) 5.5 4.6 - 8.0 Final    Protein (UA POC) Trace Negative Final    Urobilinogen (POC) 1 mg/dL  Final    Nitrites (UA POC) Negative Negative Final    Leukocyte esterase (UA POC) Trace Negative Final   Results for orders placed or performed in visit on 06/07/19   AMB POC URINALYSIS DIP STICK AUTO W/ MICRO (PGU)     Status: None   Result Value Ref Range Status    Glucose (UA POC) Negative Negative mg/dL Final    Bilirubin (UA POC) Negative Negative Final    Ketones (UA POC) Negative Negative Final    Specific gravity (UA POC) 1.005 1.001 - 1.035 Final    Blood (UA POC) Negative Negative Final    pH (UA POC) 6.0 4.6 - 8.0 Final    Protein (UA POC) Negative Negative Final    Urobilinogen (POC) 0.2 mg/dL  Final    Nitrites (UA POC) Negative Negative Final    Leukocyte esterase (UA POC) Negative Negative Final       UA - Micro  WBC - 0  RBC - 0  Bacteria - 0  Epith - 0    Physical Exam    Assessment and Plan    ICD-10-CM ICD-9-CM    1. Malignant neoplasm of prostate (HCC)  C61 185 AMB POC URINALYSIS DIP STICK AUTO W/O MICRO (PGU)      PSA, DIAGNOSTIC (PROSTATE SPECIFIC AG)   2. Elevated PSA  R97.20 790.93 AMB POC URINALYSIS DIP STICK AUTO W/O MICRO (PGU)   3. Erectile dysfunction, unspecified erectile dysfunction type  N52.9 607.84 AMB POC URINALYSIS DIP STICK AUTO W/O MICRO (PGU)       Orders Placed This Encounter   ??? PROSTATE SPECIFIC AG     Standing Status:   Future     Standing Expiration Date:   05/17/2020   ??? AMB POC URINALYSIS DIP STICK AUTO W/O MICRO (PGU)     PSA back to baseline. Low risk prostate cancer. Continue AS. DRE on return.   Follow-up and Dispositions    ?? Return in about 6 months (around 04/16/2020) for PSA.

## 2019-10-18 NOTE — Progress Notes (Signed)
[facility]  [address]  [place] [address]  [phone]    Christopher Gonzalez  DOB: 1941/05/15    Chief Complaint   Patient presents with   ??? Prostate Cancer   ??? Elevated PSA   ??? Erectile Dysfunction        HPI     Christopher Gonzalez is a 79 y.o. male ??moved here from [place], [place]. Had??prostate cancer diagnosed in 4-18.??MRI fusion biopsy then showed 1/12 biopsies with 3+3 prostate cancer in 10% of sample. Being managed with active surveillance.??Previous negative biopsy in 2017. Had 102 gm prostate then.   ??  PSA was 5.04 in 12-16 with right apical nodule.   PSA 5.87 in 11-18.   Takes??Tamsulosin 0.8 mg daily   He c/o low libido.   ??  PSA 4.6 in 6-19. Testosterone normal at 428 IN 8-19.  ??  In 2-20, PSA  5.7.  Doing well on Flomax.   PSA 8.3 in 8-20.   Prostate MRI 06-15-19: ??  FINDINGS:  PROSTATE SIZE: The gland measures 6.0 x 4.2 x 6.7 cm.  ??  PERIPHERAL GLAND: There is normal T2 signal within the peripheral zone of the  prostate.   ??  CENTRAL GLAND: Advanced changes of BPH. No focal transition zone lesion.  ??  CAPSULE: The prostatic capsule is maintained without evidence of metastatic  spread.  ??  PERINEURAL: The neurovascular bundles and posterolateral periprostatic fat are  normal without evidence of tumor extension.   ??  SEMINAL VESICLES: The seminal vesicles and ejaculatory ducts are normal.  ??  LYMPH NODES: No pelvic lymphadenopathy seen.  Limited imaging of the lower  abdomen shows no discrete adenopathy or free fluid.  ??  BONES: No aggressive osseous lesion.  ??  BLADDER and RECTUM: Preserved and grossly unremarkable.  ??  ??  IMPRESSION  IMPRESSION:  1. No discernible lesion seen. Capsule preserved.  2. No lymphadenopathy.  PSA 6.2 in 12-20.  No problems voiding.   Past Medical History:   Diagnosis Date   ??? Elevated PSA    ??? Erectile dysfunction    ??? Hypercholesterolemia    ??? Personal history of prostate cancer    ??? Prostate cancer [facility])      Past Surgical History:   Procedure  Laterality Date   ??? HX APPENDECTOMY      1950   ??? HX CHOLECYSTECTOMY     ??? HX HERNIA REPAIR     ??? 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 atorvastatin (LIPITOR) 20 mg tablet Take 1 Tab by mouth daily. 90 Tab 3   ??? tamsulosin (Flomax) 0.4 mg capsule Take 1 Cap by mouth two (2) times a day. (Patient taking differently: Take 0.8 mg by mouth daily.) 180 Cap 3   ??? tadalafiL (CIALIS) 20 mg tablet Take 1 Tab by mouth as needed for Erectile Dysfunction. 30 Tab 5   ??? aspirin delayed-release 81 mg tablet Take 81 mg by mouth daily.     ??? meclizine (ANTIVERT) 12.5 mg tablet Take  by mouth as needed.     ??? cyanocobalamin (VITAMIN B-12) 100 mcg tablet Take 100 mcg by mouth daily.     ??? pyridoxine HCl, vitamin B6, (VITAMIN B-6 PO) Take  by mouth daily.     ??? BIOTIN PO Take 5,000 mcg by mouth  daily.     ??? turmeric (CURCUMIN) 250 mg by Does Not Apply route daily.     ??? multivitamin (ONE A DAY) tablet Take 1 Tab by mouth daily.     ??? s-adenosylmethionine (SAM-E) 200 mg tab Take 200 mg by mouth daily.       Allergies   Allergen Reactions   ??? Codeine Nausea and Vomiting     Social History     Socioeconomic History   ??? Marital status: DIVORCED     Spouse name: Not on file   ??? Number of children: Not on file   ??? Years of education: Not on file   ??? Highest education level: Not on file   Occupational History   ??? Not on file   Social Needs   ??? Financial resource strain: Not on file   ??? Food insecurity     Worry: Not on file     Inability: Not on file   ??? Transportation needs     Medical: Not on file     Non-medical: Not on file   Tobacco Use   ??? Smoking status: Former Smoker   ??? Smokeless tobacco: Never Used   Substance and Sexual Activity   ??? Alcohol use: Yes     Alcohol/week: 2.0 standard drinks     Types: 1 Glasses of wine, 1 Shots of liquor per week   ??? Drug use: Never   ??? Sexual activity: Not on file   Lifestyle   ??? Physical activity      Days per week: Not on file     Minutes per session: Not on file   ??? Stress: Not on file   Relationships   ??? Social Product manager on phone: Not on file     Gets together: Not on file     Attends religious service: Not on file     Active member of club or organization: Not on file     Attends meetings of clubs or organizations: Not on file     Relationship status: Not on file   ??? Intimate partner violence     Fear of current or ex partner: Not on file     Emotionally abused: Not on file     Physically abused: Not on file     Forced sexual activity: Not on file   Other Topics Concern   ??? Not on file   Social History Narrative    Retired Actor - Pharmacist, hospital.  From [place], [place], moved to area from [place] History   Problem Relation Age of Onset   ??? Dementia Mother    ??? Stroke Father    ??? No Known Problems Brother    ??? No Known Problems Brother        Review of Systems  Constitutional:   Negative for fever and headaches.  ENT:  Negative for high frequency hearing loss.  Respiratory:  Negative for shortness of breath.  Cardiovascular:  Negative for chest pain.  GI:  Negative for abdominal pain.  Genitourinary: Positive for erectile dysfunction. Negative for urinary burning and hematuria.  Musculoskeletal:  Negative for back pain.  Neurological:  Negative for numbness.  Psychological:  Negative for depression.  Endocrine:  Negative for fatigue.  Hem/Lymphatic:  Negative for easy bruising.      Visit Vitals  BP (!) 147/66   Pulse 63   Temp 97.2 ??F (36.2 ??C) (Temporal)  Urinalysis  UA - Dipstick  Results for orders placed or performed in visit on 10/18/19   AMB POC URINALYSIS DIP STICK AUTO W/O MICRO (PGU)     Status: None   Result Value Ref Range Status    Glucose (UA POC) Negative Negative mg/dL Final    Bilirubin (UA POC) Small Negative Final    Ketones (UA POC) Negative Negative Final    Specific gravity (UA POC) 1.020 1.001 - 1.035 Final    Blood (UA POC) Negative Negative Final     pH (UA POC) 5.5 4.6 - 8.0 Final    Protein (UA POC) Trace Negative Final    Urobilinogen (POC) 1 mg/dL  Final    Nitrites (UA POC) Negative Negative Final    Leukocyte esterase (UA POC) Trace Negative Final   Results for orders placed or performed in visit on 06/07/19   AMB POC URINALYSIS DIP STICK AUTO W/ MICRO (PGU)     Status: None   Result Value Ref Range Status    Glucose (UA POC) Negative Negative mg/dL Final    Bilirubin (UA POC) Negative Negative Final    Ketones (UA POC) Negative Negative Final    Specific gravity (UA POC) 1.005 1.001 - 1.035 Final    Blood (UA POC) Negative Negative Final    pH (UA POC) 6.0 4.6 - 8.0 Final    Protein (UA POC) Negative Negative Final    Urobilinogen (POC) 0.2 mg/dL  Final    Nitrites (UA POC) Negative Negative Final    Leukocyte esterase (UA POC) Negative Negative Final       UA - Micro  WBC - 0  RBC - 0  Bacteria - 0  Epith - 0    Physical Exam    Assessment and Plan    ICD-10-CM ICD-9-CM    1. Malignant neoplasm of prostate (HCC)  C61 185 AMB POC URINALYSIS DIP STICK AUTO W/O MICRO (PGU)      PSA, DIAGNOSTIC (PROSTATE SPECIFIC AG)   2. Elevated PSA  R97.20 790.93 AMB POC URINALYSIS DIP STICK AUTO W/O MICRO (PGU)   3. Erectile dysfunction, unspecified erectile dysfunction type  N52.9 607.84 AMB POC URINALYSIS DIP STICK AUTO W/O MICRO (PGU)       Orders Placed This Encounter   ??? PROSTATE SPECIFIC AG     Standing Status:   Future     Standing Expiration Date:   05/17/2020   ??? AMB POC URINALYSIS DIP STICK AUTO W/O MICRO (PGU)     PSA back to baseline. Low risk prostate cancer. Continue AS. DRE on return.   Follow-up and Dispositions    ?? Return in about 6 months (around 04/16/2020) for PSA.

## 2020-01-22 ENCOUNTER — Encounter

## 2020-02-24 ENCOUNTER — Encounter

## 2020-02-24 MED ORDER — ATORVASTATIN 20 MG TAB
20 mg | ORAL_TABLET | Freq: Every day | ORAL | 3 refills | Status: DC
Start: 2020-02-24 — End: 2020-12-08

## 2020-02-24 NOTE — Telephone Encounter (Signed)
Sent to the preferred pharmacy.   Requested Prescriptions     Signed Prescriptions Disp Refills   ??? atorvastatin (LIPITOR) 20 mg tablet 90 Tablet 3     Sig: Take 1 Tablet by mouth daily.     Authorizing Provider: Fenton Foy     Ordering User: Langley Adie

## 2020-02-24 NOTE — Telephone Encounter (Signed)
 Sent to the preferred pharmacy.   Requested Prescriptions     Signed Prescriptions Disp Refills   . atorvastatin  (LIPITOR) 20 mg tablet 90 Tablet 3     Sig: Take 1 Tablet by mouth daily.     Authorizing Provider: FELICIANA NORLEEN BRAVO     Ordering User: BOWERS, KARMYN

## 2020-02-25 MED ORDER — TADALAFIL 20 MG TABLET
20 mg | ORAL_TABLET | ORAL | 5 refills | Status: DC | PRN
Start: 2020-02-25 — End: 2020-07-22

## 2020-03-03 ENCOUNTER — Ambulatory Visit: Primary: Family Medicine

## 2020-03-03 ENCOUNTER — Ambulatory Visit: Payer: MEDICARE | Primary: Family Medicine

## 2020-03-03 DIAGNOSIS — Z23 Encounter for immunization: Secondary | ICD-10-CM

## 2020-03-25 ENCOUNTER — Encounter: Payer: MEDICARE | Primary: Family Medicine

## 2020-04-10 ENCOUNTER — Encounter: Primary: Family Medicine

## 2020-04-17 ENCOUNTER — Encounter: Attending: Urology | Primary: Family Medicine

## 2020-06-19 ENCOUNTER — Encounter

## 2020-06-19 ENCOUNTER — Encounter: Admit: 2020-06-19 | Discharge: 2020-06-19 | Payer: MEDICARE | Primary: Family Medicine

## 2020-06-19 DIAGNOSIS — N401 Enlarged prostate with lower urinary tract symptoms: Secondary | ICD-10-CM

## 2020-06-20 LAB — PSA, DIAGNOSTIC (PROSTATE SPECIFIC AG): Prostate Specific Ag: 5.5 ng/mL — ABNORMAL HIGH (ref 0.0–4.0)

## 2020-06-20 LAB — PSA PROSTATIC SPECIFIC ANTIGEN: PSA: 5.5 ng/mL — ABNORMAL HIGH (ref 0.0–4.0)

## 2020-06-26 ENCOUNTER — Encounter: Payer: MEDICARE | Attending: Urology | Primary: Family Medicine

## 2020-07-22 ENCOUNTER — Encounter

## 2020-07-22 MED ORDER — TADALAFIL 20 MG TABLET
20 mg | ORAL_TABLET | ORAL | 5 refills | Status: AC | PRN
Start: 2020-07-22 — End: ?

## 2020-07-22 MED ORDER — TAMSULOSIN SR 0.4 MG 24 HR CAP
0.4 mg | ORAL_CAPSULE | Freq: Every day | ORAL | 5 refills | Status: DC
Start: 2020-07-22 — End: 2020-09-04

## 2020-07-27 ENCOUNTER — Ambulatory Visit: Attending: Urology | Primary: Family Medicine

## 2020-07-27 ENCOUNTER — Encounter

## 2020-07-27 ENCOUNTER — Ambulatory Visit: Admit: 2020-07-27 | Discharge: 2020-07-27 | Payer: MEDICARE | Attending: Urology | Primary: Family Medicine

## 2020-07-27 DIAGNOSIS — N529 Male erectile dysfunction, unspecified: Secondary | ICD-10-CM

## 2020-07-27 LAB — AMB POC URINALYSIS DIP STICK AUTO W/O MICRO (PGU)
Bilirubin (UA POC): NEGATIVE
Bilirubin, Urine, POC: NEGATIVE
Glucose (UA POC): NEGATIVE mg/dL
Glucose, Urine, POC: NEGATIVE mg/dL
KETONES, Urine, POC: NEGATIVE
Ketones (UA POC): NEGATIVE
Leukocyte Esterase, Urine, POC: NEGATIVE
Leukocyte esterase (UA POC): NEGATIVE
Nitrite, Urine, POC: NEGATIVE
Nitrites (UA POC): NEGATIVE
Protein (UA POC): NEGATIVE
Protein, Urine, POC: NEGATIVE
Specific Gravity, Urine, POC: 1.03 NA (ref 1.001–1.035)
Specific gravity (UA POC): 1.03 (ref 1.001–1.035)
Urobilinogen (POC): 0.2
Urobilinogen, POC: 0.2
pH (UA POC): 6 (ref 4.6–8.0)
pH, Urine, POC: 6 NA (ref 4.6–8.0)

## 2020-07-27 MED ORDER — FINASTERIDE 5 MG TAB
5 mg | ORAL_TABLET | Freq: Every day | ORAL | 3 refills | Status: DC
Start: 2020-07-27 — End: 2021-01-25

## 2020-07-27 NOTE — Progress Notes (Signed)
[facility]  [address] [place] [address]  [phone]    Christopher Gonzalez  DOB: 1941-02-15    Chief Complaint   Patient presents with   ??? Erectile Dysfunction        HPI     Christopher Gonzalez is a 79 y.o. male  ??hx of prostate cancer. moved here from [place], [place]. Had??prostate cancer diagnosed in 4-18.??MRI fusion biopsy in 2018 then showed 1/12 biopsies with 3+3 prostate cancer in 10% of sample. Being managed with active surveillance.??Previous negative biopsy in 2017. Had 102 gm prostate then.   ??  PSA was 5.04 in 12-16 with right apical nodule.   PSA 5.87 in 11-18.   Takes??Tamsulosin 0.8 mg daily   He c/o low libido.   ??  PSA 4.6 in 6-19. Testosterone normal at 428 IN 8-19.  ??  In 2-20, PSA????5.7. ??Doing well on Flomax.   PSA 8.3 in 8-20.   Prostate MRI 06-15-19: ??  FINDINGS:  PROSTATE SIZE: The gland measures 6.0 x 4.2 x 6.7 cm.  ??  PERIPHERAL GLAND: There is normal T2 signal within the peripheral zone of the  prostate.   ??  CENTRAL GLAND: Advanced changes of BPH. No focal transition zone lesion.  ??  CAPSULE: The prostatic capsule is maintained without evidence of metastatic  spread.  ??  PERINEURAL: The neurovascular bundles and posterolateral periprostatic fat are  normal without evidence of tumor extension.   ??  SEMINAL VESICLES: The seminal vesicles and ejaculatory ducts are normal.  ??  LYMPH NODES: No pelvic lymphadenopathy seen. ??Limited imaging of the lower  abdomen shows no discrete adenopathy or free fluid.  ??  BONES: No aggressive osseous lesion.  ??  BLADDER and RECTUM: Preserved and grossly unremarkable.  ??  ??  IMPRESSION  IMPRESSION:  1. No discernible lesion seen. Capsule preserved.  2. No lymphadenopathy.  PSA 6.2 in 12-20.  No problems voiding.   PSA 5.5 in 9-21.   Past Medical History:   Diagnosis Date   ??? Elevated PSA    ??? Erectile dysfunction    ??? Hypercholesterolemia    ??? Personal history of prostate cancer    ??? Prostate cancer [facility])      Past Surgical History:    Procedure Laterality Date   ??? HX APPENDECTOMY      1950   ??? HX CHOLECYSTECTOMY     ??? HX HERNIA REPAIR     ??? 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 finasteride (PROSCAR) 5 mg tablet Take 1 Tablet by mouth daily. 90 Tablet 3   ??? tadalafiL (CIALIS) 20 mg tablet Take 1 Tablet by mouth as needed for Erectile Dysfunction. 30 Tablet 5   ??? tamsulosin (Flomax) 0.4 mg capsule Take 1 Capsule by mouth daily. 60 Capsule 5   ??? atorvastatin (LIPITOR) 20 mg tablet Take 1 Tablet by mouth daily. 90 Tablet 3   ??? aspirin delayed-release 81 mg tablet Take 81 mg by mouth daily.     ??? meclizine (ANTIVERT) 12.5 mg tablet Take  by mouth as needed.     ??? cyanocobalamin (VITAMIN B-12) 100 mcg tablet Take 100 mcg by mouth daily.     ??? pyridoxine HCl, vitamin B6, (VITAMIN B-6 PO) Take  by mouth daily.     ??? BIOTIN PO Take 5,000  mcg by mouth daily.     ??? turmeric (CURCUMIN) 250 mg by Does Not Apply route daily.     ??? multivitamin (ONE A DAY) tablet Take 1 Tab by mouth daily.     ??? s-adenosylmethionine (SAM-E) 200 mg tab Take 200 mg by mouth daily.       Allergies   Allergen Reactions   ??? Codeine Nausea and Vomiting     Social History     Socioeconomic History   ??? Marital status: DIVORCED     Spouse name: Not on file   ??? Number of children: Not on file   ??? Years of education: Not on file   ??? Highest education level: Not on file   Occupational History   ??? Not on file   Tobacco Use   ??? Smoking status: Former Smoker   ??? Smokeless tobacco: Never Used   Substance and Sexual Activity   ??? Alcohol use: Yes     Alcohol/week: 2.0 standard drinks     Types: 1 Glasses of wine, 1 Shots of liquor per week   ??? Drug use: Never   ??? Sexual activity: Not on file   Other Topics Concern   ??? Not on file   Social History Narrative    Retired Actor - Pharmacist, hospital.  From [place], [place], moved to area from [place] Strain:    ??? Difficulty of Paying Living Expenses:    Food Insecurity:    ??? Worried About Charity fundraiser in the Last Year:    ??? Arboriculturist in the Last Year:    Transportation Needs:    ??? Film/video editor (Medical):    ??? Lack of Transportation (Non-Medical):    Physical Activity:    ??? Days of Exercise per Week:    ??? Minutes of Exercise per Session:    Stress:    ??? Feeling of Stress :    Social Connections:    ??? Frequency of Communication with Friends and Family:    ??? Frequency of Social Gatherings with Friends and Family:    ??? Attends Religious Services:    ??? Marine scientist or Organizations:    ??? Attends Music therapist:    ??? Marital Status:    Intimate Production manager Violence:    ??? Fear of Current or Ex-Partner:    ??? Emotionally Abused:    ??? Physically Abused:    ??? Sexually Abused:      Family History   Problem Relation Age of Onset   ??? Dementia Mother    ??? Stroke Father    ??? No Known Problems Brother    ??? No Known Problems Brother        ROS    There were no vitals taken for this visit.    Urinalysis  UA - Dipstick  Results for orders placed or performed in visit on 07/27/20   AMB POC URINALYSIS DIP STICK AUTO W/O MICRO (PGU)     Status: None   Result Value Ref Range Status    Glucose (UA POC) Negative Negative mg/dL Final    Bilirubin (UA POC) Negative Negative Final    Ketones (UA POC) Negative Negative Final    Specific gravity (UA POC) 1.030 1.001 - 1.035 Final    Blood (UA POC) Trace-intact Negative Final    pH (UA POC) 6.0 4.6 -  8.0 Final    Protein (UA POC) Negative Negative Final    Urobilinogen (POC) 0.2 mg/dL  Final    Nitrites (UA POC) Negative Negative Final    Leukocyte esterase (UA POC) Negative Negative Final   Results for orders placed or performed in visit on 06/07/19   AMB POC URINALYSIS DIP STICK AUTO W/ MICRO (PGU)     Status: None   Result Value Ref Range Status    Glucose (UA POC) Negative Negative mg/dL Final    Bilirubin (UA POC) Negative Negative  Final    Ketones (UA POC) Negative Negative Final    Specific gravity (UA POC) 1.005 1.001 - 1.035 Final    Blood (UA POC) Negative Negative Final    pH (UA POC) 6.0 4.6 - 8.0 Final    Protein (UA POC) Negative Negative Final    Urobilinogen (POC) 0.2 mg/dL  Final    Nitrites (UA POC) Negative Negative Final    Leukocyte esterase (UA POC) Negative Negative Final       UA - Micro  WBC - 0  RBC - 0  Bacteria - 0  Epith - 0    Physical Exam    Assessment and Plan    ICD-10-CM ICD-9-CM    1. Erectile dysfunction, unspecified erectile dysfunction type  N52.9 607.84 AMB POC URINALYSIS DIP STICK AUTO W/O MICRO (PGU)   2. Malignant neoplasm of prostate (HCC)  C61 185 PSA, DIAGNOSTIC (PROSTATE SPECIFIC AG)   3. Benign localized prostatic hyperplasia with lower urinary tract symptoms (LUTS)  N40.1 600.21 finasteride (PROSCAR) 5 mg tablet     599.69    continue tamsulosin 0.8 mg.   We discussed additional options for his BPH/LUTS, he is sexually active discussed TURP, finasteride, rezum, Urolift.   His prostate is approx 84 gm by MRI. TURP would be the best option., we discussed retrograde ejaculation and other effects of the surgery.   For now will add finasteride to tamslosin 0.8 mg daily  Discussed possible side effects and effect on PSA.   Orders Placed This Encounter   ??? PROSTATE SPECIFIC AG     Standing Status:   Future     Standing Expiration Date:   02/24/2021   ??? AMB POC URINALYSIS DIP STICK AUTO W/O MICRO (PGU)   ??? finasteride (PROSCAR) 5 mg tablet     Sig: Take 1 Tablet by mouth daily.     Dispense:  90 Tablet     Refill:  3       Follow-up and Dispositions    ?? Return in about 6 months (around 01/25/2021) for PSA.

## 2020-09-04 ENCOUNTER — Encounter

## 2020-09-04 MED ORDER — TAMSULOSIN SR 0.4 MG 24 HR CAP
0.4 mg | ORAL_CAPSULE | Freq: Every day | ORAL | 5 refills | Status: DC
Start: 2020-09-04 — End: 2020-09-15

## 2020-09-04 NOTE — Telephone Encounter (Signed)
Tamsulosin refilled per pt's request.

## 2020-09-15 ENCOUNTER — Encounter

## 2020-09-15 MED ORDER — TAMSULOSIN SR 0.4 MG 24 HR CAP
0.4 mg | ORAL_CAPSULE | Freq: Every day | ORAL | 3 refills | Status: DC
Start: 2020-09-15 — End: 2020-09-15

## 2020-09-15 MED ORDER — TAMSULOSIN SR 0.4 MG 24 HR CAP
0.4 mg | ORAL_CAPSULE | Freq: Every day | ORAL | 0 refills | Status: AC
Start: 2020-09-15 — End: ?

## 2020-12-02 ENCOUNTER — Encounter

## 2020-12-08 ENCOUNTER — Encounter

## 2020-12-08 MED ORDER — ATORVASTATIN 20 MG TAB
20 mg | ORAL_TABLET | Freq: Every day | ORAL | 0 refills | Status: DC
Start: 2020-12-08 — End: 2020-12-17

## 2020-12-08 NOTE — Telephone Encounter (Signed)
 E-prescribed to pharmacy  Orders Placed This Encounter   . atorvastatin  (LIPITOR) 20 mg tablet     Sig: Take 1 Tablet by mouth daily.     Dispense:  90 Tablet     Refill:  0   Yearly f/u appt scheduled for 12/17/20

## 2020-12-09 NOTE — Telephone Encounter (Signed)
Prescription refused for Lipitor as it has already been filled//Christopher Gonzalez

## 2020-12-17 ENCOUNTER — Ambulatory Visit: Attending: Cardiovascular Disease | Primary: Family Medicine

## 2020-12-17 ENCOUNTER — Ambulatory Visit
Admit: 2020-12-17 | Discharge: 2020-12-17 | Payer: MEDICARE | Attending: Cardiovascular Disease | Primary: Family Medicine

## 2020-12-17 DIAGNOSIS — R931 Abnormal findings on diagnostic imaging of heart and coronary circulation: Secondary | ICD-10-CM

## 2020-12-17 MED ORDER — ATORVASTATIN 20 MG TAB
20 mg | ORAL_TABLET | Freq: Every day | ORAL | 3 refills | Status: AC
Start: 2020-12-17 — End: ?

## 2020-12-17 NOTE — Progress Notes (Signed)
Progress  Notes by Fenton Foy, MD at 12/17/20 0930                Author: Fenton Foy, MD  Service: --  Author Type: Physician       Filed: 12/17/20 0943  Encounter Date: 12/17/2020  Status: Signed          Editor: Fenton Foy, MD (Physician)                          [facility]   [address], [address]   [place] [address]   PHONE: [phone]         12/17/20      NAME:  Christopher Gonzalez   DOB: [birth year]   MRN: [id number]             SUBJECTIVE:    Christopher Gonzalez is a 80 y.o.  male seen for a follow up visit regarding the following:         Chief Complaint       Patient presents with        ?  Annual Exam        ?  Hypertension           HPI:     Christopher Gonzalez is here in f/u on his CAD asymptomatic elevation in CCS 924 in 2019 .    He has gained some weight and was less active with Covid year.     No chest pain or dyspnea.          Past Medical History, Past Surgical History, Family history, Social History, and Medications were all reviewed with the patient today and updated as necessary.         Prior to Admission medications             Medication  Sig  Start Date  End Date  Taking?  Authorizing Provider            atorvastatin (LIPITOR) 20 mg tablet  Take 1 Tablet by mouth daily.  12/17/20    Yes  Kamani Magnussen, Balinda Quails, MD     tamsulosin (Flomax) 0.4 mg capsule  Take 2 Capsules by mouth daily.  09/15/20    Yes  Marya Fossa, MD     tadalafiL (CIALIS) 20 mg tablet  Take 1 Tablet by mouth as needed for Erectile Dysfunction.  07/22/20    Yes  Marya Fossa, MD     aspirin delayed-release 81 mg tablet  Take 81 mg by mouth daily.      Yes  Provider, Historical     meclizine (ANTIVERT) 12.5 mg tablet  Take  by mouth as needed.      Yes  Provider, Historical     cyanocobalamin (VITAMIN B-12) 100 mcg tablet  Take 100 mcg by mouth daily.      Yes  Provider, Historical     pyridoxine HCl, vitamin B6, (VITAMIN B-6 PO)  Take  by mouth daily.      Yes  Provider, Historical     BIOTIN PO  Take 5,000 mcg by mouth  daily.      Yes  Provider, Historical     turmeric (CURCUMIN)  250 mg by Does Not Apply route daily.      Yes  Provider, Historical     multivitamin (ONE A DAY) tablet  Take 1 Tab by mouth daily.  Yes  Provider, Historical     s-adenosylmethionine (SAM-E) 200 mg tab  Take 200 mg by mouth daily.      Yes  Provider, Historical            finasteride (PROSCAR) 5 mg tablet  Take 1 Tablet by mouth daily.   Patient not taking: Reported on 12/17/2020  07/27/20      Marya Fossa, MD          Allergies        Allergen  Reactions         ?  Codeine  Nausea and Vomiting          Past Medical History:        Diagnosis  Date         ?  Elevated PSA       ?  Erectile dysfunction       ?  Hypercholesterolemia       ?  Personal history of prostate cancer           ?  Prostate cancer [facility])            Past Surgical History:         Procedure  Laterality  Date          ?  [place]          ?  HX CHOLECYSTECTOMY         ?  HX HERNIA REPAIR         ?  HX MALIGNANT SKIN LESION EXCISION              basal cell - right cheek          Family History         Problem  Relation  Age of Onset          ?  Dementia  Mother       ?  Stroke  Father       ?  No Known Problems  Brother            ?  No Known Problems  Brother             Social History          Tobacco Use         ?  Smoking status:  Former Smoker     ?  Smokeless tobacco:  Never Used       Substance Use Topics         ?  Alcohol use:  Yes              Alcohol/week:  2.0 standard drinks              Types:  1 Glasses of wine, 1 Shots of liquor per week           ROS:      ROS           PHYSICAL EXAM:      Visit Vitals      BP  122/62     Pulse  61     Ht  5\' 7"  (1.702 m)         Wt  178 lb 9.6 oz (81 kg)  Comment: with shoes        BMI  27.97 kg/m??  Wt Readings from Last 3 Encounters:        12/17/20  178 lb 9.6 oz (81 kg)     08/21/19  176 lb 3.2 oz (79.9 kg)        06/15/19  170 lb (77.1 kg)          BP Readings from Last 3 Encounters:         12/17/20  122/62     10/18/19  (!) 147/66        08/21/19  134/76              Physical Exam   Constitutional :        Appearance: He is well-developed.   HENT :       Head: Normocephalic.   Neck:       Vascular: No carotid bruit or JVD.   Cardiovascular :       Rate and Rhythm: Normal rate and regular rhythm.      Heart sounds: Normal heart sounds.    Pulmonary:       Effort: Pulmonary effort is normal.      Breath sounds: Normal breath sounds.   Abdominal :      Palpations: Abdomen is soft.    Skin:      General: Skin is warm and dry.   Neurological :       Mental Status: He is alert.            EKG   Sinus  Rhythm rate is 61 bpm     QRSd    WITHIN NORMAL LIMITS   Medical problems and test results were reviewed with the patient today.       DATA REVIEW         LIPID PANEL    No results for input(s): CHOL, CHOLPOCT, HDL, LDL, LDLC, LDLCPOC, LDLCEXT, TRIGL, TGLPOCT, CHHD, CHHDX in the last 7224 hours.      Invalid input(s): HCLPOC, LDLCHOL, LDLPOCT          BMP No results for input(s): NA, K, CL, CO2, AGAP, GLU, BUN, CREA, BUCR, GFRAA, GFRNA, CA, GFRAA in the last 7224 hours.      Invalid input(s): [id number], [id number], KP, CLP, CO2P, GLUC, BUNPL, CREAP, CAPL                         OUTSIDE RECORDS REVIEW      Records from outside providers have been reviewed and summarized as noted in the HPI, past history and data review sections of this note.          ASSESSMENT and PLAN      Diagnoses and all orders for this visit:      1. [place] coronary artery calcium score greater than 924 in 2019        2. Dyslipidemia  lipids have been at goal no change in meds    -     atorvastatin (LIPITOR) 20 mg tablet; Take 1 Tablet by mouth daily.   -     AMB POC EKG ROUTINE W/ 12 LEADS, INTER & REP      3. Pure hypercholesterolemia as above       4. Primary hypertension reasonable levels continue to monitor   no change in meds  Seabron Spates, MD   12/17/2020   9:31 AM

## 2021-01-18 ENCOUNTER — Encounter: Admit: 2021-01-18 | Discharge: 2021-01-18 | Payer: MEDICARE | Primary: Geriatric Medicine

## 2021-01-18 DIAGNOSIS — C61 Malignant neoplasm of prostate: Secondary | ICD-10-CM

## 2021-01-18 NOTE — Telephone Encounter (Signed)
-----   Message from Rufus Cones sent at 01/18/2021 12:29 PM EDT -----  Subject: Appointment Request    Reason for Call: Routine Medicare AWV    QUESTIONS  Type of Appointment? New Patient/New to Provider  Reason for appointment request? No appointments available during search  Additional Information for Provider? would like an appt with Ryan Rodgers Morones  ---------------------------------------------------------------------------  --------------  GWENITH SANES INFO  What is the best way for the office to contact you? OK to leave message on   voicemail  Preferred Call Back Phone Number? [phone]  ---------------------------------------------------------------------------  --------------  SCRIPT ANSWERS  Relationship to Patient? Self  Specialty Confirmation? Primary Care  (If the patient has Medicare as their primary insurance coverage ask this   question) Are you requesting a Medicare Annual Wellness Visit? Yes   (Is the patient requesting a pap smear with their physical exam?)? No  (Is the patient requesting their annual physical and does not need PAP or   AWV per above?)? No  Have you been diagnosed with, awaiting test results for, or told that you   are suspected of having COVID-19 (Coronavirus)? (If patient has tested   negative or was tested as a requirement for work, school, or travel and   not based on symptoms, answer no)? No  Within the past 10 days have you developed any of the following symptoms   (answer "no" if symptoms have been present longer than 10 days or began   more than 10 days ago)? Fever or Chills, Cough, Shortness of breath or   difficulty breathing, Loss of taste or smell, Sore throat, Nasal   congestion, Sneezing or runny nose, Fatigue or generalized body aches   (answer no if pain is specific to a body part e.g. back pain), Diarrhea,   Headache? No  Have you had close contact with someone with COVID-19 in the last 7 days?   No  (Service Expert - click yes below to proceed with [employer] As Usual   Scheduling)? Yes

## 2021-01-18 NOTE — Telephone Encounter (Signed)
A voice mail was left about his message to schedule a new patient appointment with Dr. Delma Freeze.  Other providers were offered besides Dr. Delma Freeze due to he's not taking any new patients.

## 2021-01-19 LAB — PSA, DIAGNOSTIC (PROSTATE SPECIFIC AG): Prostate Specific Ag: 6.1 ng/mL — ABNORMAL HIGH (ref 0.0–4.0)

## 2021-01-19 LAB — PSA PROSTATIC SPECIFIC ANTIGEN: PSA: 6.1 ng/mL — ABNORMAL HIGH (ref 0.0–4.0)

## 2021-01-25 ENCOUNTER — Ambulatory Visit: Attending: Registered Nurse | Primary: Geriatric Medicine

## 2021-01-25 ENCOUNTER — Ambulatory Visit: Admit: 2021-01-25 | Discharge: 2021-01-25 | Payer: MEDICARE | Attending: Registered Nurse | Primary: Geriatric Medicine

## 2021-01-25 DIAGNOSIS — N401 Enlarged prostate with lower urinary tract symptoms: Secondary | ICD-10-CM

## 2021-01-25 LAB — AMB POC URINALYSIS DIP STICK AUTO W/O MICRO (PGU)
Bilirubin (UA POC): NEGATIVE
Bilirubin, Urine, POC: NEGATIVE
Blood (UA POC): NEGATIVE
Blood (UA POC): NEGATIVE
Glucose (UA POC): NEGATIVE mg/dL
Glucose, Urine, POC: NEGATIVE mg/dL
KETONES, Urine, POC: NEGATIVE
Ketones (UA POC): NEGATIVE
Leukocyte Esterase, Urine, POC: NEGATIVE
Leukocyte esterase (UA POC): NEGATIVE
Nitrite, Urine, POC: NEGATIVE
Nitrites (UA POC): NEGATIVE
Protein (UA POC): NEGATIVE
Protein, Urine, POC: NEGATIVE
Specific Gravity, Urine, POC: 1.01 NA (ref 1.001–1.035)
Specific gravity (UA POC): 1.01 (ref 1.001–1.035)
Urobilinogen (POC): 0.2
Urobilinogen, POC: 0.2
pH (UA POC): 5.5 (ref 4.6–8.0)
pH, Urine, POC: 5.5 NA (ref 4.6–8.0)

## 2021-01-25 NOTE — Progress Notes (Signed)
[facility]  [address] [place] [address]  [phone]          Christopher Gonzalez  DOB: [birth year]    Chief Complaint   Patient presents with   ??? Follow-up   ??? Prostate Cancer   ??? Benign Prostatic Hypertrophy   ??? Erectile Dysfunction          HPI     Christopher Gonzalez is a 80 y.o. male hx of prostate cancer. moved here from [place], [place]. Had??prostate cancer diagnosed in 4-18.??MRI fusion biopsy in 2018 then showed 1/12 biopsies with 3+3 prostate cancer in 10% of sample. Being managed with active surveillance.??Previous negative biopsy in 2017. Had 102 gm prostate then.   ??  PSA was 5.04 in 12-16 with right apical nodule.   PSA 5.87 in 11-18.   Takes??Tamsulosin 0.8 mg daily   He c/o low libido.   ??  PSA 4.6 in 6-19. Testosterone normal at 428 IN 8-19.  ??  In 2-20, PSA????5.7. ??Doing well on Flomax.   PSA 8.3 in 8-20.??  Prostate MRI 06-15-19: ??  FINDINGS:  PROSTATE SIZE: The gland measures 6.0 x 4.2 x 6.7 cm.  ??  PERIPHERAL GLAND: There is normal T2 signal within the peripheral zone of the  prostate.   ??  CENTRAL GLAND: Advanced changes of BPH. No focal transition zone lesion.  ??  CAPSULE: The prostatic capsule is maintained without evidence of metastatic  spread.  ??  PERINEURAL: The neurovascular bundles and posterolateral periprostatic fat are  normal without evidence of tumor extension.   ??  SEMINAL VESICLES: The seminal vesicles and ejaculatory ducts are normal.  ??  LYMPH NODES: No pelvic lymphadenopathy seen. ??Limited imaging of the lower  abdomen shows no discrete adenopathy or free fluid.  ??  BONES: No aggressive osseous lesion.  ??  BLADDER and RECTUM: Preserved and grossly unremarkable.  ??  ??  IMPRESSION  IMPRESSION:  1. No discernible lesion seen. Capsule preserved.  2. No lymphadenopathy.  PSA 6.2 in 12-20.  PSA 5.5 in 9-21.   Dr Delaney Meigs added Proscar to aid w weak urine stream. He could NOT tolerate this. Now only on Flomax 0.4 mg PO daily. LUTS stable but bothersome.  PSA  4/22 6.1          Past Medical History:   Diagnosis Date   ??? Elevated PSA    ??? Erectile dysfunction    ??? Hypercholesterolemia    ??? Personal history of prostate cancer    ??? Prostate cancer [facility])      Past Surgical History:   Procedure Laterality Date   ??? HX APPENDECTOMY      1950   ??? HX CHOLECYSTECTOMY     ??? HX HERNIA REPAIR     ??? 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 atorvastatin (LIPITOR) 20 mg tablet Take 1 Tablet by mouth daily. 90 Tablet 3   ??? tamsulosin (Flomax) 0.4 mg capsule Take 2 Capsules by mouth daily. 40 Capsule 0   ??? tadalafiL (CIALIS) 20 mg tablet Take 1 Tablet by mouth as needed for Erectile Dysfunction. 30 Tablet 5   ??? aspirin delayed-release 81 mg tablet Take 81 mg by mouth daily.     ??? meclizine (ANTIVERT) 12.5 mg tablet Take  by mouth as needed.     ???  cyanocobalamin (VITAMIN B-12) 100 mcg tablet Take 100 mcg by mouth daily.     ??? pyridoxine HCl, vitamin B6, (VITAMIN B-6 PO) Take  by mouth daily.     ??? BIOTIN PO Take 5,000 mcg by mouth daily.     ??? multivitamin (ONE A DAY) tablet Take 1 Tab by mouth daily.     ??? s-adenosylmethionine (SAM-E) 200 mg tab Take 200 mg by mouth daily.     ??? finasteride (PROSCAR) 5 mg tablet Take 1 Tablet by mouth daily. (Patient not taking: Reported on 12/17/2020) 90 Tablet 3   ??? turmeric (CURCUMIN) 250 mg by Does Not Apply route daily.       Allergies   Allergen Reactions   ??? Codeine Nausea and Vomiting     Social History     Socioeconomic History   ??? Marital status: DIVORCED     Spouse name: Not on file   ??? Number of children: Not on file   ??? Years of education: Not on file   ??? Highest education level: Not on file   Occupational History   ??? Not on file   Tobacco Use   ??? Smoking status: Former Smoker   ??? Smokeless tobacco: Never Used   Substance and Sexual Activity   ??? Alcohol use: Yes     Alcohol/week: 2.0 standard drinks     Types: 1 Glasses of wine, 1 Shots of liquor per  week   ??? Drug use: Never   ??? Sexual activity: Not on file   Other Topics Concern   ??? Not on file   Social History Narrative    Retired Actor - Pharmacist, hospital.  From [place], [place], moved to area from [place] Strain:    ??? Difficulty of Paying Living Expenses: Not on file   Food Insecurity:    ??? Worried About Charity fundraiser in the Last Year: Not on file   ??? [employer] of Food in the Last Year: Not on file   Transportation Needs:    ??? Film/video editor (Medical): Not on file   ??? Lack of Transportation (Non-Medical): Not on file   Physical Activity:    ??? Days of Exercise per Week: Not on file   ??? Minutes of Exercise per Session: Not on file   Stress:    ??? Feeling of Stress : Not on file   Social Connections:    ??? Frequency of Communication with Friends and Family: Not on file   ??? Frequency of Social Gatherings with Friends and Family: Not on file   ??? Attends Religious Services: Not on file   ??? Active Member of Clubs or Organizations: Not on file   ??? Attends Archivist Meetings: Not on file   ??? Marital Status: Not on file   Intimate Partner Violence:    ??? Fear of Current or Ex-Partner: Not on file   ??? Emotionally Abused: Not on file   ??? Physically Abused: Not on file   ??? Sexually Abused: Not on file   Housing Stability:    ??? Unable to Pay for Housing in the Last Year: Not on file   ??? Number of Places Lived in the Last Year: Not on file   ??? Unstable Housing in the Last Year: Not on file     Family History   Problem Relation Age of Onset   ???  Dementia Mother    ??? Stroke Father    ??? No Known Problems Brother    ??? No Known Problems Brother        Review of Systems  Constitutional:   Negative for fever.  GI:  Negative for abdominal pain.  Musculoskeletal:  Negative for back pain.      Urinalysis  UA - Dipstick  Results for orders placed or performed in visit on 01/25/21   AMB POC URINALYSIS DIP STICK AUTO W/O MICRO (PGU)     Status: None    Result Value Ref Range Status    Glucose (UA POC) Negative Negative mg/dL Final    Bilirubin (UA POC) Negative Negative Final    Ketones (UA POC) Negative Negative Final    Specific gravity (UA POC) 1.010 1.001 - 1.035 Final    Blood (UA POC) Negative Negative Final    pH (UA POC) 5.5 4.6 - 8.0 Final    Protein (UA POC) Negative Negative Final    Urobilinogen (POC) 0.2 mg/dL  Final    Nitrites (UA POC) Negative Negative Final    Leukocyte esterase (UA POC) Negative Negative Final   Results for orders placed or performed in visit on 06/07/19   AMB POC URINALYSIS DIP STICK AUTO W/ MICRO (PGU)     Status: None   Result Value Ref Range Status    Glucose (UA POC) Negative Negative mg/dL Final    Bilirubin (UA POC) Negative Negative Final    Ketones (UA POC) Negative Negative Final    Specific gravity (UA POC) 1.005 1.001 - 1.035 Final    Blood (UA POC) Negative Negative Final    pH (UA POC) 6.0 4.6 - 8.0 Final    Protein (UA POC) Negative Negative Final    Urobilinogen (POC) 0.2 mg/dL  Final    Nitrites (UA POC) Negative Negative Final    Leukocyte esterase (UA POC) Negative Negative Final       UA - Micro  WBC - 0  RBC - 0  Bacteria - 0  Epith - 0    PHYSICAL EXAM    General appearance - well appearing and in no distress  Mental status - alert, oriented to person, place, and time  Neck - supple, no significant adenopathy  Chest/Lung-  Quiet, even and easy respiratory effort without use of accessory muscles  Skin - normal coloration and turgor, no rashes      Assessment and Plan    ICD-10-CM ICD-9-CM    1. Benign localized prostatic hyperplasia with lower urinary tract symptoms (LUTS)  N40.1 600.21 AMB POC URINALYSIS DIP STICK AUTO W/O MICRO (PGU)     599.69    2. Erectile dysfunction, unspecified erectile dysfunction type  N52.9 607.84 AMB POC URINALYSIS DIP STICK AUTO W/O MICRO (PGU)   3. Malignant neoplasm of prostate (HCC)  C61 185 AMB POC URINALYSIS DIP STICK AUTO W/O MICRO (PGU)       BPH/LUTS- cannot tolerate  Proscar. Cont Flomax 0.4 mg PO daily. Discussed BPH tx options including TURP vs Urolift vs Rezum. He is interested in Rezum. I will contact Dr Delaney Meigs to see if patient is a candidate.    ED- stable w Cialis.    Prostate CA- he opts to cont AS. RTO in 6 months for follow up w PSA one week prior. Advised to call sooner if sx worsen.    Sanda Linger, FNP  Dr. Myrna Blazer is supervising physician today and he approves plan of care.

## 2021-01-26 ENCOUNTER — Ambulatory Visit: Attending: Geriatric Medicine | Primary: Geriatric Medicine

## 2021-01-26 ENCOUNTER — Ambulatory Visit
Admit: 2021-01-26 | Discharge: 2021-01-26 | Payer: MEDICARE | Attending: Geriatric Medicine | Primary: Geriatric Medicine

## 2021-01-26 ENCOUNTER — Inpatient Hospital Stay: Admit: 2021-01-26 | Payer: MEDICARE | Primary: Geriatric Medicine

## 2021-01-26 DIAGNOSIS — R7303 Prediabetes: Secondary | ICD-10-CM

## 2021-01-26 DIAGNOSIS — I10 Essential (primary) hypertension: Secondary | ICD-10-CM

## 2021-01-26 LAB — CBC WITH AUTOMATED DIFF
ABS. BASOPHILS: 0.1 10*3/uL (ref 0.0–0.2)
ABS. EOSINOPHILS: 0.1 10*3/uL (ref 0.0–0.8)
ABS. IMM. GRANS.: 0 10*3/uL (ref 0.0–0.5)
ABS. LYMPHOCYTES: 1.5 10*3/uL (ref 0.5–4.6)
ABS. MONOCYTES: 0.6 10*3/uL (ref 0.1–1.3)
ABS. NEUTROPHILS: 4.3 10*3/uL (ref 1.7–8.2)
ABSOLUTE NRBC: 0 10*3/uL (ref 0.0–0.2)
BASOPHILS: 1 % (ref 0.0–2.0)
EOSINOPHILS: 2 % (ref 0.5–7.8)
HCT: 43.7 % (ref 41.1–50.3)
HGB: 14.3 g/dL (ref 13.6–17.2)
IMMATURE GRANULOCYTES: 1 % (ref 0.0–5.0)
LYMPHOCYTES: 23 % (ref 13–44)
MCH: 28.8 PG (ref 26.1–32.9)
MCHC: 32.7 g/dL (ref 31.4–35.0)
MCV: 87.9 FL (ref 79.6–97.8)
MONOCYTES: 9 % (ref 4.0–12.0)
MPV: 9.9 FL (ref 9.4–12.3)
NEUTROPHILS: 65 % (ref 43–78)
PLATELET: 167 10*3/uL (ref 150–450)
RBC: 4.97 M/uL (ref 4.23–5.6)
RDW: 14 % (ref 11.9–14.6)
WBC: 6.5 10*3/uL (ref 4.3–11.1)

## 2021-01-26 LAB — LIPID PANEL
CHOL/HDL Ratio: 2.1
Chol/HDL Ratio: 2.1
Cholesterol, Total: 147 MG/DL (ref <200)
Cholesterol, total: 147 MG/DL (ref <200)
HDL Cholesterol: 71 MG/DL — ABNORMAL HIGH (ref 40–60)
HDL: 71 MG/DL — ABNORMAL HIGH (ref 40–60)
LDL Calculated: 66.8 MG/DL (ref <100)
LDL, calculated: 66.8 MG/DL (ref <100)
Triglyceride: 46 MG/DL (ref 35–150)
Triglycerides: 46 MG/DL (ref 35–150)
VLDL Cholesterol Calculated: 9.2 MG/DL (ref 6.0–23.0)
VLDL, calculated: 9.2 MG/DL (ref 6.0–23.0)

## 2021-01-26 LAB — HEMOGLOBIN A1C WITH EAG
Est. average glucose: 111 mg/dL
Hemoglobin A1c: 5.5 % (ref 4.20–6.30)

## 2021-01-26 LAB — TSH 3RD GENERATION
TSH: 1.11 u[IU]/mL (ref 0.358–3.740)
TSH: 1.11 u[IU]/mL (ref 0.358–3.740)

## 2021-01-26 LAB — CBC WITH AUTO DIFFERENTIAL
Basophils %: 1 % (ref 0.0–2.0)
Basophils Absolute: 0.1 10*3/uL (ref 0.0–0.2)
Eosinophils %: 2 % (ref 0.5–7.8)
Eosinophils Absolute: 0.1 10*3/uL (ref 0.0–0.8)
Granulocyte Absolute Count: 0 10*3/uL (ref 0.0–0.5)
Hematocrit: 43.7 % (ref 41.1–50.3)
Hemoglobin: 14.3 g/dL (ref 13.6–17.2)
Immature Granulocytes: 1 % (ref 0.0–5.0)
Lymphocytes %: 23 % (ref 13–44)
Lymphocytes Absolute: 1.5 10*3/uL (ref 0.5–4.6)
MCH: 28.8 PG (ref 26.1–32.9)
MCHC: 32.7 g/dL (ref 31.4–35.0)
MCV: 87.9 FL (ref 79.6–97.8)
MPV: 9.9 FL (ref 9.4–12.3)
Monocytes %: 9 % (ref 4.0–12.0)
Monocytes Absolute: 0.6 10*3/uL (ref 0.1–1.3)
NRBC Absolute: 0 10*3/uL (ref 0.0–0.2)
Neutrophils %: 65 % (ref 43–78)
Neutrophils Absolute: 4.3 10*3/uL (ref 1.7–8.2)
Platelets: 167 10*3/uL (ref 150–450)
RBC: 4.97 M/uL (ref 4.23–5.6)
RDW: 14 % (ref 11.9–14.6)
WBC: 6.5 10*3/uL (ref 4.3–11.1)

## 2021-01-26 LAB — HEMOGLOBIN A1C W/EAG
Hemoglobin A1C: 5.5 % (ref 4.20–6.30)
eAG: 111 mg/dL

## 2021-01-26 NOTE — Progress Notes (Signed)
SUBJECTIVE:   Christopher Gonzalez is a 80 y.o. 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       Current Outpatient Medications   Medication Sig Dispense Refill   ??? cholecalciferol (Vitamin D3) (5000 Units/125 mcg) tab tablet Take 5,000 Units by mouth daily.     ??? atorvastatin (LIPITOR) 20 mg tablet Take 1 Tablet by mouth daily. 90 Tablet 3   ??? tamsulosin (Flomax) 0.4 mg capsule Take 2 Capsules by mouth daily. 40 Capsule 0   ??? tadalafiL (CIALIS) 20 mg tablet Take 1 Tablet by mouth as needed for Erectile Dysfunction. 30 Tablet 5   ??? aspirin delayed-release 81 mg tablet Take 81 mg by mouth daily.     ??? meclizine (ANTIVERT) 12.5 mg tablet Take  by mouth as needed.     ??? cyanocobalamin (VITAMIN B-12) 100 mcg tablet Take 100 mcg by mouth daily.     ??? pyridoxine HCl, vitamin B6, (VITAMIN B-6 PO) Take  by mouth daily.     ??? BIOTIN PO Take 5,000 mcg by mouth daily.     ??? multivitamin (ONE A DAY) tablet Take 1 Tab by mouth daily.     ??? s-adenosylmethionine (SAM-E) 200 mg tab Take 200 mg by mouth daily.       Allergies   Allergen Reactions   ??? Codeine Nausea and Vomiting     Patient Active Problem List   Diagnosis Code   ??? Hyperlipidemia E78.5   ??? Hypertension I10   ??? Impaired fasting glucose R73.01   ??? 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 Elevated PSA    ??? Erectile dysfunction    ??? Hypercholesterolemia    ??? Personal history of prostate cancer    ??? Prostate cancer [name])      Past Surgical History:   Procedure Laterality Date   ??? HX APPENDECTOMY      1950   ??? HX CHOLECYSTECTOMY     ??? HX HERNIA REPAIR     ??? HX MALIGNANT SKIN LESION EXCISION      basal cell - right cheek     Family History   Problem Relation Age of Onset   ??? Dementia Mother    ??? Stroke Father    ??? No Known Problems Brother    ??? No Known Problems Brother      Social History     Tobacco Use   ??? Smoking status: Former Smoker     Packs/day: 0.25   ??? Smokeless tobacco: Never Used   Substance Use Topics   ??? Alcohol use: Yes     Alcohol/week: 2.0 standard drinks     Types: 1 Glasses of  wine, 1 Shots of liquor per week         Review of Systems   Constitutional: Negative for fever.   Respiratory: Negative for cough and shortness of breath.    Cardiovascular: Negative for chest pain and leg swelling.   Gastrointestinal: Negative for abdominal pain.   Psychiatric/Behavioral: The patient is not nervous/anxious.          OBJECTIVE:  Visit Vitals  BP (!) 142/66   Pulse 62   Wt 170 lb 6.4 oz (77.3 kg)   SpO2 98%   BMI 26.69 kg/m??        Physical Exam  Constitutional:       General: He is not in acute distress.  Cardiovascular:      Rate and Rhythm: Normal rate and regular rhythm.   Pulmonary:      Effort: Pulmonary effort is normal. No respiratory distress.      Breath sounds: No wheezing.   Musculoskeletal:      Right lower leg: No edema.      Left lower leg: No edema.   Neurological:      Mental Status: He is oriented to person, place, and time.   Psychiatric:         Mood and Affect: Mood normal.         Behavior: Behavior normal.         Medical problems and test results were reviewed with the patient today.     Recent Results (from the past 672 hour(s))   PSA, DIAGNOSTIC (PROSTATE SPECIFIC AG)    Collection Time:  01/18/21  2:17 PM   Result Value Ref Range    Prostate Specific Ag 6.1 (H) 0.0 - 4.0 ng/mL   AMB POC URINALYSIS DIP STICK AUTO W/O MICRO (PGU)    Collection Time: 01/25/21  1:48 PM   Result Value Ref Range    Glucose (UA POC) Negative Negative mg/dL    Bilirubin (UA POC) Negative Negative    Ketones (UA POC) Negative Negative    Specific gravity (UA POC) 1.010 1.001 - 1.035    Blood (UA POC) Negative Negative    pH (UA POC) 5.5 4.6 - 8.0    Protein (UA POC) Negative Negative    Urobilinogen (POC) 0.2 mg/dL     Nitrites (UA POC) Negative Negative    Leukocyte esterase (UA POC) Negative Negative         ASSESSMENT and PLAN    Diagnoses and all orders for this visit:    1. Prediabetes  -     HEMOGLOBIN A1C WITH EAG; Future    2. Essential hypertension  -     CBC WITH AUTOMATED DIFF; Future  -     METABOLIC PANEL, COMPREHENSIVE; Future  -     TSH 3RD GENERATION; Future  -     PROTEIN/CREATININE RATIO, URINE; Future    3. Pure hypercholesterolemia  -     LIPID PANEL; Future    4. Need for hepatitis C screening test  -     HEPATITIS C AB; Future      It took more than 45 min to care for this pt today  Over 50% of today's office visit was spent in face to face time in counseling   (may include anyor all of the following: reviewing test results, prognosis, importance of compliance, education about disease process, benefits of medications, instructions for management of acute flare-ups, and follow up plans).

## 2021-01-27 LAB — METABOLIC PANEL, COMPREHENSIVE
A-G Ratio: 1.4 (ref 1.2–3.5)
ALT (SGPT): 30 U/L (ref 12–65)
AST (SGOT): 21 U/L (ref 15–37)
Albumin: 4.1 g/dL (ref 3.2–4.6)
Alk. phosphatase: 120 U/L (ref 50–136)
Anion gap: 6 mmol/L — ABNORMAL LOW (ref 7–16)
BUN: 18 MG/DL (ref 8–23)
Bilirubin, total: 0.7 MG/DL (ref 0.2–1.1)
CO2: 27 mmol/L (ref 21–32)
Calcium: 9.5 MG/DL (ref 8.3–10.4)
Chloride: 107 mmol/L (ref 98–107)
Creatinine: 1 MG/DL (ref 0.8–1.5)
GFR est AA: >60 mL/min/{1.73_m2} (ref >60)
GFR est non-AA: >60 mL/min/{1.73_m2} (ref >60)
Globulin: 3 g/dL (ref 2.3–3.5)
Glucose: 82 mg/dL (ref 65–100)
Potassium: 4.7 mmol/L (ref 3.5–5.1)
Protein, total: 7.1 g/dL (ref 6.3–8.2)
Sodium: 140 mmol/L (ref 136–145)

## 2021-01-27 LAB — PROTEIN/CREATININE RATIO, URINE
Creatinine, urine random: 223 mg/dL
Protein, urine random: 17 mg/dL — ABNORMAL HIGH (ref <11.9)
Protein/Creat. urine Ratio: 0.1

## 2021-01-27 LAB — HEPATITIS C AB: Hepatitis C virus Ab: NONREACTIVE

## 2021-01-27 LAB — COMPREHENSIVE METABOLIC PANEL
ALT: 30 U/L (ref 12–65)
AST: 21 U/L (ref 15–37)
Albumin/Globulin Ratio: 1.4 (ref 1.2–3.5)
Albumin: 4.1 g/dL (ref 3.2–4.6)
Alkaline Phosphatase: 120 U/L (ref 50–136)
Anion Gap: 6 mmol/L — ABNORMAL LOW (ref 7–16)
BUN: 18 MG/DL (ref 8–23)
CO2: 27 mmol/L (ref 21–32)
Calcium: 9.5 MG/DL (ref 8.3–10.4)
Chloride: 107 mmol/L (ref 98–107)
Creatinine: 1 MG/DL (ref 0.8–1.5)
EGFR IF NonAfrican American: >60 mL/min/{1.73_m2} (ref >60)
GFR African American: >60 mL/min/{1.73_m2} (ref >60)
Globulin: 3 g/dL (ref 2.3–3.5)
Glucose: 82 mg/dL (ref 65–100)
Potassium: 4.7 mmol/L (ref 3.5–5.1)
Sodium: 140 mmol/L (ref 136–145)
Total Bilirubin: 0.7 MG/DL (ref 0.2–1.1)
Total Protein: 7.1 g/dL (ref 6.3–8.2)

## 2021-01-27 LAB — PROTEIN / CREATININE RATIO, URINE
Creatinine, Ur: 223 mg/dL
Protein, Urine, Random: 17 mg/dL — ABNORMAL HIGH (ref <11.9)
Protein/Creat Ratio: 0.1

## 2021-01-27 LAB — HEPATITIS C ANTIBODY: HCV Ab: NONREACTIVE

## 2021-02-01 NOTE — Telephone Encounter (Signed)
Dr Delaney Meigs reviewed chart. He would like the patient to come in the prostate [place] only (no bx) and discussed Rezum.     Please notify the patient and schedule apt.    Sanda Linger, FNP

## 2021-02-16 NOTE — Telephone Encounter (Signed)
Called pt no answer, lvm for pt to call back, I did schedule pt for TRUS on 03/08/21 @ 9:50am but also advised him to call back.

## 2021-02-19 NOTE — Telephone Encounter (Signed)
Called pt he is fine with appt on 03/08/21 and informed him to take a Fleet enema 1.5 hours before coming to office. Pt understood.

## 2021-03-04 ENCOUNTER — Ambulatory Visit
Admit: 2021-03-04 | Discharge: 2021-03-04 | Payer: MEDICARE | Attending: Geriatric Medicine | Primary: Geriatric Medicine

## 2021-03-04 ENCOUNTER — Encounter: Attending: Geriatric Medicine | Primary: Geriatric Medicine

## 2021-03-04 DIAGNOSIS — E7849 Other hyperlipidemia: Secondary | ICD-10-CM

## 2021-03-04 NOTE — Progress Notes (Signed)
SUBJECTIVE:   Christopher Gonzalez is a 80 y.o. male seen for a visit regarding   Chief Complaint   Patient presents with   ??? Medicare AWV   ??? 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       Current Outpatient Medications   Medication Sig Dispense Refill   ??? BIOTIN PO Take 5,000 mcg by mouth daily     ??? aspirin 81 MG EC tablet Take 81 mg by mouth daily     ??? atorvastatin (LIPITOR) 20 MG tablet Take 20 mg by mouth daily     ??? cyanocobalamin 100 MCG tablet Take 100 mcg by mouth daily     ??? meclizine (ANTIVERT) 12.5 MG tablet Take by mouth as needed     ??? S-Adenosylmethionine 200 MG TABS Take 200 mg by mouth daily     ??? tadalafil (CIALIS) 20 MG tablet Take 20 mg by mouth as needed     ??? tamsulosin (FLOMAX) 0.4 MG capsule Take 0.8 mg by mouth daily       No current facility-administered medications for this visit.     Allergies   Allergen Reactions   ??? Codeine Nausea And Vomiting     Patient Active Problem List   Diagnosis   ??? Agatston coronary artery calcium score greater than 400   ??? Hyperlipidemia   ??? Hypertension   ??? Impaired fasting glucose     Past Medical History:   Diagnosis Date   ??? Elevated PSA    ??? Erectile dysfunction    ??? Hypercholesterolemia    ??? Personal history of  prostate cancer    ??? Prostate cancer [facility])      Past Surgical History:   Procedure Laterality Date   ??? APPENDECTOMY      1950   ??? CHOLECYSTECTOMY     ??? HERNIA REPAIR     ??? MALIGNANT SKIN LESION EXCISION      basal cell - right cheek     Family History   Problem Relation Age of Onset   ??? No Known Problems Brother    ??? No Known Problems Brother    ??? Stroke Father    ??? Dementia Mother      Social History     Tobacco Use   ??? Smoking status: Former Smoker     Packs/day: 0.25   ??? Smokeless tobacco: Never Used   Substance Use Topics   ??? Alcohol use: Yes     Alcohol/week: 2.0 standard drinks         Review of Systems   Constitutional: Negative for fever.   Respiratory: Negative for cough and shortness of breath.    Cardiovascular: Negative for chest pain  and leg swelling.   Gastrointestinal: Negative for abdominal pain.   Psychiatric/Behavioral: Negative for behavioral problems and confusion.         OBJECTIVE:  BP 128/66    Pulse 66    Ht 5\' 7"  (1.702 m)    Wt 166 lb (75.3 kg)    SpO2 96%    BMI 26.00 kg/m??      Physical Exam  Vitals and nursing note reviewed.   Constitutional:       General: He is not in acute distress.  Cardiovascular:      Rate and Rhythm: Normal rate and regular rhythm.   Pulmonary:      Effort: Pulmonary effort is normal.      Breath sounds: No wheezing.   Neurological:      General: No focal deficit present.      Mental Status: He is oriented to person, place, and time.   Psychiatric:         Mood and Affect: Mood normal.         Behavior: Behavior normal.         Medical problems and test results were reviewed with the patient today.     No results found for this or any previous visit (from the past 672 hour(s)).      ASSESSMENT and PLAN    [place] was seen today for medicare awv and prostate cancer.    Diagnoses and all orders for this visit:    Other hyperlipidemia  -     Comprehensive Metabolic Panel; Future  -     Lipid Panel; Future    History of prostate cancer  -     [facility] Christophe Louis, Gwyndolyn Saxon)  -     CBC with Auto Differential; Future    Need for pneumococcal vaccine  -     Pneumococcal Conjugate PCV20, PF (Prevnar 20)    Medicare annual wellness visit, subsequent    Advance care planning          Return in 6 months (on 09/03/2021) for HTN(labs ~ 3 days before).     Medicare Annual Wellness Visit    Christopher Gonzalez is here for Medicare AWV and Prostate Cancer    Assessment & Plan   Other hyperlipidemia  -     Comprehensive Metabolic Panel; Future  -     Lipid Panel; Future  History of prostate cancer  -     [facility] Christophe Louis, Gwyndolyn Saxon)  -     CBC with Auto Differential; Future  Need for pneumococcal vaccine  -     Pneumococcal Conjugate PCV20, PF (Prevnar 20)  Medicare annual wellness visit, subsequent  Advance care planning      Recommendations for Preventive Services Due: see orders and patient instructions/AVS.  Recommended screening schedule for the next 5-10 years is provided to the patient in written form: see Patient Instructions/AVS.     Return in 6 months (on 09/03/2021) for HTN(labs ~ 3 days before).     Subjective       Patient's complete Health Risk Assessment and screening values have been reviewed and are found in Flowsheets. The following problems were reviewed today and where indicated follow up appointments were made and/or referrals ordered.    Positive Risk Factor Screenings with Interventions:               General Health and ACP:  General  In general,  how would you say your health is?: Very Good  In the past 7 days, have you experienced any of the following: New or Increased Pain, New or Increased Fatigue, Loneliness, Social Isolation, Stress or Anger?: No  Do you get the social and emotional support that you need?: Yes  Do you have a Living Will?: Yes    Advance Directives     Power of Attorney Living Will ACP-Advance Directive ACP-Power of Attorney    Not on File Not on File Not on File Not on File       General Health Risk Interventions:  ?? No Living Will: Advance Care Planning addressed with patient today      Safety:  Do you have working smoke detectors?: Yes  Do you have any tripping hazards - loose or unsecured carpets or rugs?: No  Do you have any tripping hazards - clutter in doorways, halls, or stairs?: No  Do you have either shower bars, grab bars, non-slip mats or non-slip surfaces in your shower or bathtub?: (!) No  Do all of your stairways have a railing or banister?: Yes  Do you always fasten your seatbelt when you are in a car?: Yes    Safety Interventions:  ?? Home safety tips provided           Objective   Vitals:    03/04/21 0812   BP: 128/66   Pulse: 66   SpO2: 96%   Weight: 166 lb (75.3 kg)   Height: 5\' 7"  (1.702 m)      Body mass index is 26 kg/m??.            Allergies   Allergen Reactions   ??? Codeine Nausea And Vomiting     Prior to Visit Medications    Medication Sig Taking? Authorizing Provider   BIOTIN PO Take 5,000 mcg by mouth daily Yes Ar Automatic Reconciliation   aspirin 81 MG EC tablet Take 81 mg by mouth daily Yes Ar Automatic Reconciliation   atorvastatin (LIPITOR) 20 MG tablet Take 20 mg by mouth daily Yes Ar Automatic Reconciliation   cyanocobalamin 100 MCG tablet Take 100 mcg by mouth daily Yes Ar Automatic Reconciliation   meclizine (ANTIVERT) 12.5 MG tablet Take by mouth as needed Yes Ar Automatic Reconciliation   S-Adenosylmethionine 200 MG TABS Take 200 mg by mouth daily Yes Ar Automatic Reconciliation   tadalafil (CIALIS) 20 MG tablet Take 20 mg by mouth as needed Yes Ar Automatic Reconciliation   tamsulosin (FLOMAX) 0.4 MG capsule Take 0.8 mg by mouth daily Yes Ar Automatic Reconciliation       CareTeam (Including outside providers/suppliers regularly involved in providing care):   Patient Care Team:  Sharlyn Bologna, MD as PCP - General  Elyn Aquas Sela Hua., MD as PCP - UROLOGY  Sharlyn Bologna, MD as PCP - [facility] Empaneled Provider     Reviewed and updated this  visit:  Tobacco   Allergies   Meds   Problems   Med Hx   Surg Hx   Soc Hx   Fam Hx

## 2021-03-04 NOTE — Patient Instructions (Signed)
Personalized Preventive Plan for Christopher Gonzalez - 03/04/2021  Medicare offers a range of preventive health benefits. Some of the tests and screenings are paid in full while other may be subject to a deductible, co-insurance, and/or copay.    Some of these benefits include a comprehensive review of your medical history including lifestyle, illnesses that may run in your family, and various assessments and screenings as appropriate.    After reviewing your medical record and screening and assessments performed today your provider may have ordered immunizations, labs, imaging, and/or referrals for you.  A list of these orders (if applicable) as well as your Preventive Care list are included within your After Visit Summary for your review.    Other Preventive Recommendations:    ?? A preventive eye exam performed by an eye specialist is recommended every 1-2 years to screen for glaucoma; cataracts, macular degeneration, and other eye disorders.  ?? A preventive dental visit is recommended every 6 months.  ?? Try to get at least 150 minutes of exercise per week or 10,000 steps per day on a pedometer .  ?? Order or download the FREE "Exercise & Physical Activity: Your Everyday Guide" from [employer]. Call (432)579-0217 or search [employer] online.  ?? You need 1200-1500 mg of calcium and 1000-2000 IU of vitamin D per day. It is possible to meet your calcium requirement with diet alone, but a vitamin D supplement is usually necessary to meet this goal.  ?? When exposed to the sun, use a sunscreen that protects against both UVA and UVB radiation with an SPF of 30 or greater. Reapply every 2 to 3 hours or after sweating, drying off with a towel, or swimming.  ?? Always wear a seat belt when traveling in a car. Always wear a helmet when riding a bicycle or motorcycle.

## 2021-03-04 NOTE — ACP (Advance Care Planning) (Signed)
Advance Care Planning     Advance Care Planning (ACP) Physician/NP/PA Conversation    Date of Conversation: 03/04/2021  Conducted with: Patient with Decision Making Capacity    Healthcare Decision Maker: wants to name his son Stepehn Eckard as his decision maker      Click here to [place] including selection of the Healthcare Decision Maker Relationship (ie "Primary")  Today we documented Decision Maker(s). The patient will provide ACP documents.    Care Preferences:    Hospitalization:  "If your health worsens and it becomes clear that your chance of recovery is unlikely, what would be your preference regarding hospitalization?"  The patient would prefer comfort-focused treatment without hospitalization.    Ventilation:  "If you were unable to breath on your own and your chance of recovery was unlikely, what would be your preference about the use of a ventilator (breathing machine) if it was available to you?"  The patient would NOT desire the use of a ventilator.    Resuscitation:  "In the event your heart stopped as a result of an underlying serious health condition, would you want attempts made to restart your heart, or would you prefer a natural death?"  No, do NOT attempt to resuscitate.    treatment goals, benefit/burden of treatment options, artificial nutrition, ventilation preferences, hospitalization preferences, resuscitation preferences, end of life care preferences (vegetative state/imminent death) and hospice care    Conversation Outcomes / Follow-Up Plan:  ACP complete - no further action today  Reviewed DNR/DNI and patient elects Full Code (Attempt Resuscitation)    Length of Voluntary ACP Conversation in minutes:  16 minutes    Shahara Hartsfield Doreen Beam, MD

## 2021-03-08 ENCOUNTER — Ambulatory Visit: Admit: 2021-03-08 | Discharge: 2021-03-08 | Payer: MEDICARE | Attending: Urology | Primary: Geriatric Medicine

## 2021-03-08 DIAGNOSIS — N138 Other obstructive and reflux uropathy: Secondary | ICD-10-CM

## 2021-03-08 MED ORDER — DUTASTERIDE 0.5 MG PO CAPS
0.5 MG | ORAL_CAPSULE | Freq: Every day | ORAL | 3 refills | Status: AC
Start: 2021-03-08 — End: 2022-03-04

## 2021-03-08 NOTE — Progress Notes (Signed)
[facility]  [address]   [place] [address]  [phone]    Christopher Gonzalez  DOB: 1940/11/07         HPI   80 y.o., male hx of prostate cancer. moved here from [place], [place]. Had??prostate cancer diagnosed in 4-18.??MRI fusion biopsy in 2018 then showed 1/12 biopsies with 3+3 prostate cancer in 10% of sample. Being managed with active surveillance.??Previous negative biopsy in 2017. Had 102 gm prostate then.   ??  PSA was 5.04 in 12-16 with right apical nodule.   PSA 5.87 in 11-18.   Takes??Tamsulosin 0.8 mg daily   He c/o low libido.   ??  PSA 4.6 in 6-19. Testosterone normal at 428 IN 8-19.  ??  In 2-20, PSA????5.7. ??Doing well on Flomax.   PSA 8.3 in 8-20.??    Prostate MRI 06-15-19: ??  FINDINGS:  PROSTATE SIZE: The gland measures 6.0 x 4.2 x 6.7 cm.  ??  PERIPHERAL GLAND: There is normal T2 signal within the peripheral zone of the  prostate.   ??  CENTRAL GLAND: Advanced changes of BPH. No focal transition zone lesion.  ??  CAPSULE: The prostatic capsule is maintained without evidence of metastatic  spread.  ??  PERINEURAL: The neurovascular bundles and posterolateral periprostatic fat are  normal without evidence of tumor extension.   ??  SEMINAL VESICLES: The seminal vesicles and ejaculatory ducts are normal.  ??  LYMPH NODES: No pelvic lymphadenopathy seen. ??Limited imaging of the lower  abdomen shows no discrete adenopathy or free fluid.  ??  BONES: No aggressive osseous lesion.  ??  BLADDER and RECTUM: Preserved and grossly unremarkable.  ??  ??  IMPRESSION  IMPRESSION:  1. No discernible lesion seen. Capsule preserved.  2. No lymphadenopathy.  PSA 6.2 in 12-20.  No problems voiding.??  PSA 5.5 in 9-21.   continues tamsulosin 0.8 mg.   We discussed additional options for his BPH/LUTS, he is sexually active discussed TURP, finasteride, rezum, Urolift.   His prostate is approx 84 gm by MRI. TURP would be the best option., we discussed retrograde ejaculation and other effects of the surgery.    added finasteride to tamslosin 0.8 mg daily he states that he took it fr a month, had to stop because of nausea.     PSA 4/22 6.1  Here for TRUS to measure prostate size.      Past Medical History:   Diagnosis Date   ??? Elevated PSA    ??? Erectile dysfunction    ??? Hypercholesterolemia    ??? Personal history of prostate cancer    ??? Prostate cancer [facility])      Past Surgical History:   Procedure Laterality Date   ??? APPENDECTOMY      1950   ??? CHOLECYSTECTOMY     ??? HERNIA REPAIR     ??? MALIGNANT SKIN LESION EXCISION      basal cell - right cheek     Current Outpatient Medications   Medication Sig Dispense Refill   ??? dutasteride (AVODART) 0.5 mg capsule Take 1 capsule by mouth daily 90 capsule 3   ??? BIOTIN PO Take 5,000 mcg by mouth daily     ??? aspirin 81 MG EC tablet Take 81 mg by mouth daily     ??? atorvastatin (LIPITOR) 20 MG tablet Take 20 mg by mouth daily     ??? cyanocobalamin 100 MCG tablet Take 100 mcg by mouth daily     ???  meclizine (ANTIVERT) 12.5 MG tablet Take by mouth as needed     ??? S-Adenosylmethionine 200 MG TABS Take 200 mg by mouth daily     ??? tadalafil (CIALIS) 20 MG tablet Take 20 mg by mouth as needed     ??? tamsulosin (FLOMAX) 0.4 MG capsule Take 0.8 mg by mouth daily       No current facility-administered medications for this visit.     Allergies   Allergen Reactions   ??? Codeine Nausea And Vomiting     Social History     Socioeconomic History   ??? Marital status: Divorced     Spouse name: Not on file   ??? Number of children: Not on file   ??? Years of education: Not on file   ??? Highest education level: Not on file   Occupational History   ??? Not on file   Tobacco Use   ??? Smoking status: Former Smoker     Packs/day: 0.25   ??? Smokeless tobacco: Never Used   Substance and Sexual Activity   ??? Alcohol use: Yes     Alcohol/week: 2.0 standard drinks   ??? Drug use: Never   ??? Sexual activity: Not on file   Other Topics Concern   ??? Not on file   Social History Narrative    Retired Actor - Pharmacist, hospital.  From  [place], [place], moved to area from [place] Strain:    ??? Difficulty of Paying Living Expenses: Not on file   Food Insecurity:    ??? Worried About Charity fundraiser in the Last Year: Not on file   ??? [employer] of Food in the Last Year: Not on file   Transportation Needs:    ??? Film/video editor (Medical): Not on file   ??? Lack of Transportation (Non-Medical): Not on file   Physical Activity: Insufficiently Active   ??? Days of Exercise per Week: 3 days   ??? Minutes of Exercise per Session: 40 min   Stress:    ??? Feeling of Stress : Not on file   Social Connections:    ??? Frequency of Communication with Friends and Family: Not on file   ??? Frequency of Social Gatherings with Friends and Family: Not on file   ??? Attends Religious Services: Not on file   ??? Active Member of Clubs or Organizations: Not on file   ??? Attends Archivist Meetings: Not on file   ??? Marital Status: Not on file   Intimate Partner Violence:    ??? Fear of Current or Ex-Partner: Not on file   ??? Emotionally Abused: Not on file   ??? Physically Abused: Not on file   ??? Sexually Abused: Not on file   Housing Stability:    ??? Unable to Pay for Housing in the Last Year: Not on file   ??? Number of Places Lived in the Last Year: Not on file   ??? Unstable Housing in the Last Year: Not on file     Family History   Problem Relation Age of Onset   ??? No Known Problems Brother    ??? No Known Problems Brother    ??? Stroke Father    ??? Dementia Mother        UA - Dipstick  No results found for this or any previous visit.    UA - Micro  WBC -  0  RBC - 0  Bacteria - 0  Epith - 0            TRANSRECTAL ULTRASOUND GUIDED BIOPSY OF THE PROSTATE    All risks, benefits and alternatives were again reviewed and he is willing to proceed at this time.  The patient was placed in the left lateral decubitus position and the transrectal ultrasound probe was inserted into the rectum.  The prostate was visualized and measured.   The prostate appeared homogenous in appearance.  Measured prostate volume is listed below.    The ultrasound probe was removed.  The patient tolerated the procedure well.   Discharge instructions were again reviewed with the patient in detail.  He was instructed to resume his antibiotics as previously ordered and was instructed to stay off all anticoagulation for 72 hours or until no further bleeding (hematuria or hematochezia) is noted.  He was instructed to call the office immediately for any fevers or significant bleeding.    PROSTATE VOLUME:  130 gm  AUASS is 22 , highest score of 5 for intermittency.   QOL is "unhappy"   Assessment and Plan    ICD-10-CM    1. BPH with obstruction/lower urinary tract symptoms  N40.1 AMB POC [place], TRANSRECTAL    N13.8 PSA, Diagnostic   2. Prostate cancer ([place])  C61    separate E and M encounter to discussed options.  Large prostate, 130 gm.   Not a candidate for Urolift or Resum. Intolerant of finasteride.   Discussed TURP and open prostatectomy.  Will start dutasteride. Continue tamsulosin .  No follow-up provider specified.  Return in about 6 months (around 09/07/2021) for appt, PSA before.

## 2021-03-11 ENCOUNTER — Encounter

## 2021-03-12 ENCOUNTER — Encounter

## 2021-03-12 NOTE — Progress Notes (Signed)
New Patient Abstract (Uro/Onc)      MRN: [id number]    PATIENT'S NAME: Christopher Gonzalez    LAST SEEN UROLOGIST: Dr. Delaney Meigs    PCP: Ocie Bob, MD    CHIEF COMPLAINT: Prostate Cancer    PMH: Christopher Gonzalez is a 80 year old Caucasian male who reports to be a former smoker. He reports alcohol use as 2 drinks a week and denies ever using drug substances. His medical history reports as elevated PSA, ED, HLD, and prostate cancer. His surgical history reports as appendectomy, cholecystectomy, hernia repair and malignant skin lesion excision.    FAMILY HX: Father with stroke    NARRATIVE WITH RECENT WITH RESULTS/PROCEDURES/BIOPSIES AND DATES COMPLETED:   Christopher Gonzalez reports a history of prostate cancer in April 2017/2018 in [place], [place]. He relocated to [place], [place] and first saw Dr Delaney Meigs with urology in June of 2019. He reports an MRI fusion biopsy with 1/12 biopsies with 3+3 prostate cancer in 10% of sample. He did report a previous prostate biopsy in 2017 which he reported as benign. He reported PSA in 09/2015 of 5.04 and in 08/2017 of 5.87. 03/2018 PSA reported 4.6.  He was under active surveillance and was taking 0.8 mg Tamsulosin daily. He remained in every 6 month follow up with Dr Delaney Meigs.  In 05/2019 his PSA increased to 8.3. On 06/15/2019 he had a Prostate MRI that reported no lesion, no lymphadenopathy and capsule preserved. His 09/2019 PSA reported 6.2. He continued surveillance.   He last saw Dr Delaney Meigs on 03/08/21 with his 01/2021 PSA reported at 6.1. He underwent a TRUS with prostate volume reported at 130 gm. AUASS was 22 and QOL reported as ???unhappy???. He was felt to not be a candidate for Urolift or Resum. He was intolerant of finasteride. He was to start dutasteride and continue tamsulosin. They discussed TURP and open prostatectomy however his next follow up with urology is in 6 months.  On 03/04/21 he saw his PCP and requested a second opinion on his prostate cancer management.      PROCEDURES/PATHOLOGY:    03/08/21  TRANSRECTAL ULTRASOUND GUIDED BIOPSY OF THE PROSTATE  PROSTATE VOLUME:  130 gm  AUASS is 22 , highest score of 5 for intermittency.   QOL is "unhappy"   LABS:     03/12/2018 16:46 11/28/2018 11:56 05/31/2019 00:00 09/09/2019 16:24 06/19/2020 13:22 01/18/2021 14:17   Prostatic Specific Ag 4.6 (H) 5.7 (H) 8.3 (H) 6.2 (H) 5.5 (H) 6.1 (H)     IMAGING:   Prostate MRI 06-15-19:    FINDINGS:  PROSTATE SIZE: The gland measures 6.0 x 4.2 x 6.7 cm.  PERIPHERAL GLAND: There is normal T2 signal within the peripheral zone of the  prostate.   CENTRAL GLAND: Advanced changes of BPH. No focal transition zone lesion.  CAPSULE: The prostatic capsule is maintained without evidence of metastatic  spread.  PERINEURAL: The neurovascular bundles and posterolateral periprostatic fat are  normal without evidence of tumor extension.   SEMINAL VESICLES: The seminal vesicles and ejaculatory ducts are normal.  LYMPH NODES: No pelvic lymphadenopathy seen.  Limited imaging of the lower  abdomen shows no discrete adenopathy or free fluid.  BONES: No aggressive osseous lesion.  BLADDER and RECTUM: Preserved and grossly unremarkable.  IMPRESSION:  1. No discernible lesion seen. Capsule preserved.  2. No lymphadenopathy.

## 2021-03-15 ENCOUNTER — Ambulatory Visit: Admit: 2021-03-15 | Discharge: 2021-03-15 | Payer: MEDICARE | Attending: Urology | Primary: Geriatric Medicine

## 2021-03-15 ENCOUNTER — Inpatient Hospital Stay: Admit: 2021-03-15 | Payer: MEDICARE | Primary: Geriatric Medicine

## 2021-03-15 DIAGNOSIS — C61 Malignant neoplasm of prostate: Secondary | ICD-10-CM

## 2021-03-15 LAB — PSA, DIAGNOSTIC: PSA: 14.3 ng/mL — ABNORMAL HIGH (ref <4.0)

## 2021-03-15 NOTE — Patient Instructions (Addendum)
Patient Instructions from Today's Visit    Reason for Visit:  New Patient- history of prostate cancer    Diagnosis Information:  https://www.cancer.net/about-us/asco-answers-patient-education-materials/asco-answers-fact-sheets      Plan:  To see the full benefit of the dutasteride, you should at least give it about 6 months.   After about 6 months if you feel you are not receiving benefit from the medication, then you should consider either moving forward with a TURP, prostatectomy or other surgical procedures.     Follow Up:  In about 6 months with psa prior     Recent Lab Results:  n/a    Treatment Summary has been discussed and given to patient: n/a        -------------------------------------------------------------------------------------------------------------------  Please call our office at [phone] if you have any  of the following symptoms:   ?? Fever of 100.5 or greater  ?? Chills  ?? Shortness of breath  ?? Swelling or pain in one leg    After office hours an answering service is available and will contact a provider for emergencies or if you are experiencing any of the above symptoms.    ??? Patient does express an interest in My Chart.  My Chart log in information explained on the after visit summary printout at the [facility] desk.    Drae Mitzel Grooms, NCMA

## 2021-03-15 NOTE — Progress Notes (Signed)
[facility] [place] [address]  [phone]          Christopher Gonzalez  DOB: 05/11/1941      INITIAL EVALUATION    Chief Complaint   Patient presents with   ??? New Patient       HPI: Christopher Gonzalez is a 80 y.o. male with H/O of PCa and significant BPH.    Patient is originally from [place].  He then moved to [facility] where Dr. Dutch Gray was his urologist.  He has since followed with Dr. Delaney Meigs.  He is here today to see me for a second opinion.    He is a very healthy 80 year old male.  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  According to the patient there was just a small amount of cancer in the prostate.  He had a fusion biopsy and 1 of 18 cores showed Gleason 3+3 and approximately 10% of the sample.  His PSAs have been in the 4-6 range.  It was as high as 8.3 in August 2020.  His last MRI was 06/15/2019 and showed no discernible lesion within the prostate.  He underwent a transrectal ultrasound of the of the prostate on 03/08/2021 and his gland measured 130 cc.  He was thought not to be a candidate for Rezum or UroLift.      Of note, he had a negative TRUS biopsy in 2001 and 2017.    He has a long history of BPH.  He has been on 0.8 mg of Flomax for quite some time.  His last IPSS score was 22, and he reported quality of life as "unhappy".  He just recently has started on dutasteride.      PMH:     Past Medical History:   Diagnosis Date   ??? Elevated PSA    ??? Erectile dysfunction    ??? Hypercholesterolemia    ??? Personal history of prostate cancer    ??? Prostate cancer ([place])        PSH:    Past Surgical History:   Procedure Laterality Date   ??? APPENDECTOMY      1950   ??? CHOLECYSTECTOMY     ??? HERNIA REPAIR     ??? MALIGNANT SKIN LESION EXCISION      basal cell - right cheek       MEDs:    Current Outpatient Medications   Medication Sig Dispense Refill   ??? BIOTIN PO Take 5,000 mcg by mouth daily     ??? cyanocobalamin 100 MCG tablet  Take 100 mcg by mouth daily     ??? meclizine (ANTIVERT) 12.5 MG tablet Take by mouth as needed     ??? S-Adenosylmethionine 200 MG TABS Take 200 mg by mouth daily     ??? tadalafil (CIALIS) 20 MG tablet Take 20 mg by mouth as needed     ??? tamsulosin (FLOMAX) 0.4 MG capsule Take 0.8 mg by mouth daily     ??? dutasteride (AVODART) 0.5 mg capsule Take 1 capsule by mouth daily (Patient not taking: Reported on 03/15/2021) 90 capsule 3   ??? aspirin 81 MG EC tablet Take 81 mg by mouth daily     ??? atorvastatin (LIPITOR) 20 MG tablet Take 20 mg by mouth daily       No current facility-administered medications for this visit.       ALLERGIES:  Allergies   Allergen Reactions   ??? Codeine Nausea And Vomiting       FH:     Family History   Problem Relation Age of Onset   ??? No Known Problems Brother    ??? No Known Problems Brother    ??? Stroke Father    ??? Dementia Mother        SH:     Social History     Socioeconomic History   ??? Marital status: Divorced     Spouse name: Not on file   ??? Number of children: Not on file   ??? Years of education: Not on file   ??? Highest education level: Not on file   Occupational History   ??? Not on file   Tobacco Use   ??? Smoking status: Former Smoker     Packs/day: 0.25   ??? Smokeless tobacco: Never Used   Substance and Sexual Activity   ??? Alcohol use: Yes     Alcohol/week: 2.0 standard drinks   ??? Drug use: Never   ??? Sexual activity: Not on file   Other Topics Concern   ??? Not on file   Social History Narrative    Retired Actor - Pharmacist, hospital.  From [place], [place], moved to area from [place] Strain:    ??? Difficulty of Paying Living Expenses: Not on file   Food Insecurity:    ??? Worried About Charity fundraiser in the Last Year: Not on file   ??? [employer] of Food in the Last Year: Not on file   Transportation Needs:    ??? Film/video editor (Medical): Not on file   ??? Lack of Transportation (Non-Medical): Not on file   Physical Activity:  Insufficiently Active   ??? Days of Exercise per Week: 3 days   ??? Minutes of Exercise per Session: 40 min   Stress:    ??? Feeling of Stress : Not on file   Social Connections:    ??? Frequency of Communication with Friends and Family: Not on file   ??? Frequency of Social Gatherings with Friends and Family: Not on file   ??? Attends Religious Services: Not on file   ??? Active Member of Clubs or Organizations: Not on file   ??? Attends Archivist Meetings: Not on file   ??? Marital Status: Not on file   Intimate Partner Violence:    ??? Fear of Current or Ex-Partner: Not on file   ??? Emotionally Abused: Not on file   ??? Physically Abused: Not on file   ??? Sexually Abused: Not on file   Housing Stability:    ??? Unable to Pay for Housing in the Last Year: Not on file   ??? Number of Places Lived in the Last Year: Not on file   ??? Unstable Housing in the Last Year: Not on file       ROS:   Review of Systems   Constitutional: Negative.  Negative for chills, fatigue and fever.   Respiratory: Negative.    Cardiovascular: Negative.    Gastrointestinal: Negative.    Genitourinary: Negative.  Negative for difficulty urinating, dysuria, flank pain, frequency, hematuria and urgency.   Musculoskeletal: Negative.    All other systems reviewed and are negative.        PHYSICAL EXAM  GENERAL: Well-groomed, well-nourished, pleasant 80 y.o. male, in no acute distress.  BP 139/60    Pulse 59    Temp 97.6 ??F (36.4 ??C)    Resp 16    Ht 5\' 5"  (1.651 m)    Wt 168 lb (76.2 kg)    SpO2 100%    BMI 27.96 kg/m??   General: well dressed, well nourished, no acute distress  Skin: no rashes  HEENT: Sclera are clear,normocephalic, atraumatic. no external lesions  Cardiovascular: Reg. Normal perfusion  Respiratory: normal respiratory effort, no JVD, no audible wheezing.  Musculoskeletal: unremarkable with normal function. No embolic signs or cyanosis.   Neurologic exam: intact, no focal deficits, moves all 4 extremities  Psych: normal mood and affect, alert,  oriented x 3  LE:  no edema  GI: soft, nontender, no masses, no CVA tenderness  GENITOURINARY: deferred    Labs:     Lab Results   Component Value Date    PSA 14.3 03/15/2021    PSA 6.1 01/18/2021    PSA 5.5 06/19/2020          Imaging/Radiology:      MRI Results (maximum last 3):  No results found for this or any previous visit.              ASSESSMENT: SMAYAN HACKBART is a 80 y.o. 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  We had a long discussion about treatment options for both his prostate cancer and his BPH.  His BPH appears to be the more bothersome problem for him and I recommended he focus on treating that.  We plan to see how he responds to the dutasteride.  If that is not successful he may consider a TURP or referral for a HoLEP.    We discussed the possibility of robotic radical prostatectomy but he understands his age puts him at higher risk and also puts him at higher risk for urinary incontinence.    He will plan to see me back in approximately 6 months with a PSA and an IPSS.      ICD-10-CM    1. Prostate cancer ([place])  C61    2. Benign prostatic hyperplasia with urinary frequency  N40.1     R35.0          PLAN:   -rtc in 6 months with psa prior  -ipss and shim at next ov  -DRE at next ov       ________________________________________      I have seen and examined this patient.  I have reviewed and edited the note started by the MA and agree with the outlined plan.  Part of this note was written by using a voice dictation software. The note has been proof read but may still contain some grammatical/other typographical errors.      Addendum: His PSA today was 14.3.  That is higher than its been previously.  He did have a transrectal ultrasound last week that possibly could have impacted his PSA value.  He has had no prostatitis or UTI symptoms.    Merwyn Katos, [facility]

## 2021-06-30 ENCOUNTER — Encounter

## 2021-07-05 MED ORDER — TAMSULOSIN HCL 0.4 MG PO CAPS
0.4 MG | ORAL_CAPSULE | Freq: Every day | ORAL | 1 refills | Status: AC
Start: 2021-07-05 — End: 2021-10-03

## 2021-07-23 ENCOUNTER — Encounter: Primary: Geriatric Medicine

## 2021-07-30 ENCOUNTER — Encounter: Attending: Urology | Primary: Geriatric Medicine

## 2021-09-03 ENCOUNTER — Encounter: Payer: MEDICARE | Primary: Geriatric Medicine

## 2021-09-03 ENCOUNTER — Encounter

## 2021-09-03 ENCOUNTER — Encounter
Admit: 2021-09-03 | Discharge: 2021-09-03 | Payer: MEDICARE | Attending: Geriatric Medicine | Primary: Geriatric Medicine

## 2021-09-03 DIAGNOSIS — I10 Essential (primary) hypertension: Secondary | ICD-10-CM

## 2021-09-03 NOTE — Progress Notes (Signed)
SUBJECTIVE:   Christopher Gonzalez is a 80 y.o. 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, Divorced, son lives here, daughter lives in [place], likes to spend time with grandchildren, loves to read, play golf     HTN, Hyperlipidemia - Sees cardiology, no headache, home BP 120-140/70-85  Prostate cancer in 2017-2018 - watchful waiting with urology; seeing Urology  History of Skin cancer in face - Sees Dermatology  S/p appendectomy, s/p cholecystectomy, s/p tonsillectomy  Had shingles in 2020 per the patient  Has an eye doctor  Does not want to take Flu shot today    Past Medical History, Past Surgical History, Family history, Social History, and Medications were all reviewed with the patient today and updated as necessary.       Current Outpatient Medications   Medication Sig Dispense Refill    tamsulosin (FLOMAX) 0.4 MG capsule Take 2 capsules by mouth daily 180 capsule 1    dutasteride (AVODART) 0.5 mg capsule Take 1 capsule by mouth daily 90 capsule 3    BIOTIN PO Take 5,000 mcg by mouth daily      aspirin 81 MG EC tablet Take 81 mg by mouth daily      atorvastatin (LIPITOR) 20 MG tablet Take 20 mg by mouth daily      cyanocobalamin 100 MCG tablet Take 100 mcg by mouth daily      meclizine (ANTIVERT) 12.5 MG tablet Take by mouth as needed PRN      S-Adenosylmethionine 200 MG TABS Take 200 mg by mouth daily      tadalafil (CIALIS) 20 MG tablet Take 20 mg by mouth as needed       No current facility-administered medications for this visit.     Allergies   Allergen Reactions    Codeine Nausea And Vomiting     Patient Active Problem List   Diagnosis    Agatston coronary artery calcium score greater than 400    Hyperlipidemia    Hypertension    Impaired fasting glucose     Past Medical History:   Diagnosis Date    Elevated PSA     Erectile dysfunction     Hypercholesterolemia     Personal history of  prostate cancer     Prostate cancer [facility])      Past Surgical History:   Procedure Laterality Date    APPENDECTOMY      1950    CHOLECYSTECTOMY      HERNIA REPAIR      MALIGNANT SKIN LESION EXCISION      basal cell - right cheek     Family History   Problem Relation Age of Onset    No Known Problems Brother     No Known Problems Brother     Stroke Father     Dementia Mother      Social History     Tobacco Use    Smoking status: Former     Packs/day: 0.25     Types: Cigarettes    Smokeless tobacco: Never   Substance Use Topics    Alcohol use: Yes     Alcohol/week: 2.0 standard drinks         Review of Systems   Constitutional:  Negative for fever.   Respiratory:  Negative for cough and shortness of  breath.    Cardiovascular:  Negative for chest pain and leg swelling.   Gastrointestinal:  Negative for abdominal pain.   Psychiatric/Behavioral:  Negative for behavioral problems and confusion.        OBJECTIVE:  BP (!) 146/82    Pulse 72    Resp 16    Ht 5\' 5"  (1.651 m)    Wt 173 lb 3.2 oz (78.6 kg)    BMI 28.82 kg/m??      Physical Exam  Vitals and nursing note reviewed.   Constitutional:       General: He is not in acute distress.  Cardiovascular:      Rate and Rhythm: Normal rate and regular rhythm.   Pulmonary:      Effort: Pulmonary effort is normal.      Breath sounds: No wheezing.   Neurological:      General: No focal deficit present.      Mental Status: He is oriented to person, place, and time.   Psychiatric:         Mood and Affect: Mood normal.         Behavior: Behavior normal.       Medical problems and test results were reviewed with the patient today.     No results found for this or any previous visit (from the past 672 hour(s)).      ASSESSMENT and PLAN    [name] was seen today for follow-up.    Diagnoses and all orders for this visit:    Essential hypertension  -     Comprehensive Metabolic Panel; Future  -     CBC with Auto Differential; Future      No follow-ups on file.

## 2021-09-04 LAB — CBC WITH AUTO DIFFERENTIAL
Absolute Eos #: 0.2 10*3/uL (ref 0.0–0.8)
Absolute Immature Granulocyte: 0 10*3/uL (ref 0.0–0.5)
Absolute Lymph #: 1.5 10*3/uL (ref 0.5–4.6)
Absolute Mono #: 0.7 10*3/uL (ref 0.1–1.3)
Basophils Absolute: 0.1 10*3/uL (ref 0.0–0.2)
Basophils: 1 % (ref 0.0–2.0)
Eosinophils %: 3 % (ref 0.5–7.8)
Hematocrit: 43.3 % (ref 41.1–50.3)
Hemoglobin: 13.8 g/dL (ref 13.6–17.2)
Immature Granulocytes: 0 % (ref 0.0–5.0)
Lymphocytes: 24 % (ref 13–44)
MCH: 28.8 PG (ref 26.1–32.9)
MCHC: 31.9 g/dL (ref 31.4–35.0)
MCV: 90.2 FL (ref 82–102)
MPV: 10.4 FL (ref 9.4–12.3)
Monocytes: 11 % (ref 4.0–12.0)
Platelets: 165 10*3/uL (ref 150–450)
RBC: 4.8 M/uL (ref 4.23–5.6)
RDW: 13.6 % (ref 11.9–14.6)
Seg Neutrophils: 61 % (ref 43–78)
Segs Absolute: 3.9 10*3/uL (ref 1.7–8.2)
WBC: 6.4 10*3/uL (ref 4.3–11.1)
nRBC: 0 10*3/uL (ref 0.0–0.2)

## 2021-09-04 LAB — COMPREHENSIVE METABOLIC PANEL
ALT: 23 U/L (ref 12–65)
AST: 14 U/L — ABNORMAL LOW (ref 15–37)
Albumin/Globulin Ratio: 1.3 (ref 0.4–1.6)
Albumin: 3.8 g/dL (ref 3.2–4.6)
Alk Phosphatase: 137 U/L — ABNORMAL HIGH (ref 50–136)
Anion Gap: 5 mmol/L (ref 2–11)
BUN: 23 MG/DL (ref 8–23)
CO2: 26 mmol/L (ref 21–32)
Calcium: 9 MG/DL (ref 8.3–10.4)
Chloride: 111 mmol/L — ABNORMAL HIGH (ref 101–110)
Creatinine: 0.9 MG/DL (ref 0.8–1.5)
Est, Glom Filt Rate: >60 mL/min/{1.73_m2} (ref >60)
Globulin: 2.9 g/dL (ref 2.8–4.5)
Glucose: 99 mg/dL (ref 65–100)
Potassium: 4.5 mmol/L (ref 3.5–5.1)
Sodium: 142 mmol/L (ref 133–143)
Total Bilirubin: 0.6 MG/DL (ref 0.2–1.1)
Total Protein: 6.7 g/dL (ref 6.3–8.2)

## 2021-09-04 LAB — PSA, DIAGNOSTIC: PSA: 2.8 ng/mL (ref <4.0)

## 2021-09-07 ENCOUNTER — Encounter

## 2021-09-07 NOTE — Telephone Encounter (Signed)
Please sign and close

## 2021-09-08 MED ORDER — TADALAFIL 20 MG PO TABS
20 MG | ORAL_TABLET | ORAL | 11 refills | Status: AC
Start: 2021-09-08 — End: 2023-11-06

## 2021-09-10 ENCOUNTER — Encounter: Admit: 2021-09-10 | Discharge: 2021-09-10 | Payer: MEDICARE | Attending: Urology | Primary: Geriatric Medicine

## 2021-09-10 DIAGNOSIS — N138 Other obstructive and reflux uropathy: Secondary | ICD-10-CM

## 2021-09-10 LAB — AMB POC URINALYSIS DIP STICK AUTO W/O MICRO
Bilirubin, Urine, POC: NEGATIVE
Blood, Urine, POC: NEGATIVE
Glucose, Urine, POC: NEGATIVE
Ketones, Urine, POC: NEGATIVE
Leukocyte Esterase, Urine, POC: NEGATIVE
Nitrite, Urine, POC: NEGATIVE
Protein, Urine, POC: NEGATIVE
Specific Gravity, Urine, POC: 1.005 (ref 1.001–1.035)
Urobilinogen, POC: 0.2
pH, Urine, POC: 5.5 (ref 4.6–8.0)

## 2021-09-10 NOTE — Progress Notes (Signed)
[facility]  [address]  [place] [address]  [phone]    Christopher Gonzalez  DOB: 1941-01-04    Chief Complaint   Patient presents with    Follow-up        HPI     Christopher Gonzalez is a 80 y.o. male    hx of prostate cancer being treated with active surveillance. moved here from [place], [place]. Had prostate cancer diagnosed in 4-18. MRI fusion biopsy in 2018 then showed 1/12 biopsies with 3+3 prostate cancer in 10% of sample. Being managed with active surveillance. Previous negative biopsy in 2017. Had 102 gm prostate then.      PSA was 5.04 in 12-16 with right apical nodule.   PSA 5.87 in 11-18.   Takes Tamsulosin 0.8 mg daily   He c/o low libido.      PSA 4.6 in 6-19. Testosterone normal at 428 IN 8-19.     In 2-20, PSA  5.7.  Doing well on Flomax.   PSA 8.3 in 8-20.      Prostate MRI 06-15-19:    FINDINGS:  PROSTATE SIZE: The gland measures 6.0 x 4.2 x 6.7 cm.     PERIPHERAL GLAND: There is normal T2 signal within the peripheral zone of the  prostate.      CENTRAL GLAND: Advanced changes of BPH. No focal transition zone lesion.     CAPSULE: The prostatic capsule is maintained without evidence of metastatic  spread.     PERINEURAL: The neurovascular bundles and posterolateral periprostatic fat are  normal without evidence of tumor extension.      SEMINAL VESICLES: The seminal vesicles and ejaculatory ducts are normal.     LYMPH NODES: No pelvic lymphadenopathy seen.  Limited imaging of the lower  abdomen shows no discrete adenopathy or free fluid.     BONES: No aggressive osseous lesion.     BLADDER and RECTUM: Preserved and grossly unremarkable.        IMPRESSION  IMPRESSION:  1. No discernible lesion seen. Capsule preserved.  2. No lymphadenopathy.  Prostate approx 85 gm   PSA 6.2 in 12-20.  No problems voiding.   PSA 5.5 in 9-21.   continues tamsulosin 0.8 mg.   We discussed additional options for his BPH/LUTS, he is sexually active discussed TURP, finasteride, rezum, Urolift.    His prostate is approx 84 gm by MRI. TURP would be the best option., we discussed retrograde ejaculation and other effects of the surgery.   added finasteride to tamslosin 0.8 mg daily he states that he took it fr a month, had to stop because of nausea.      PSA 4/22 6.1  Had TRUS in 6-22:    PROSTATE VOLUME:  130 gm  AUASS is 22 , highest score of 5 for intermittency.   QOL is "unhappy"   Large prostate, 130 gm.   Not a candidate for Urolift or Resum. Intolerant of finasteride.   Discussed TURP and open prostatectomy.  Started dutasteride in 6-22. Marland Kitchen Continue tamsulosin .PSA was 14.3 in 6-22, prior to starting dutasteride.   PSA 2.8 in 12-22.    Past Medical History:   Diagnosis Date    Elevated PSA     Erectile dysfunction     Hypercholesterolemia     Personal history of prostate cancer     Prostate cancer [facility])      Past Surgical History:   Procedure Laterality Date    APPENDECTOMY  [place]      MALIGNANT SKIN LESION EXCISION      basal cell - right cheek     Current Outpatient Medications   Medication Sig Dispense Refill    tadalafil (CIALIS) 20 MG tablet TAKE 1 TABLET BY MOUTH AS NEEDED FOR ERECTILE DYSFUNCTION. 18 tablet 11    tamsulosin (FLOMAX) 0.4 MG capsule Take 2 capsules by mouth daily 180 capsule 1    dutasteride (AVODART) 0.5 mg capsule Take 1 capsule by mouth daily 90 capsule 3    BIOTIN PO Take 5,000 mcg by mouth daily      aspirin 81 MG EC tablet Take 81 mg by mouth daily      atorvastatin (LIPITOR) 20 MG tablet Take 20 mg by mouth daily      cyanocobalamin 100 MCG tablet Take 100 mcg by mouth daily      meclizine (ANTIVERT) 12.5 MG tablet Take by mouth as needed PRN      S-Adenosylmethionine 200 MG TABS Take 200 mg by mouth daily       No current facility-administered medications for this visit.     Allergies   Allergen Reactions    Codeine Nausea And Vomiting     Social History     Socioeconomic History    Marital status: Divorced     Spouse name: Not on  file    Number of children: Not on file    Years of education: Not on file    Highest education level: Not on file   Occupational History    Not on file   Tobacco Use    Smoking status: Former     Packs/day: 0.25     Types: Cigarettes    Smokeless tobacco: Never   Substance and Sexual Activity    Alcohol use: Yes     Alcohol/week: 2.0 standard drinks    Drug use: Never    Sexual activity: Not on file   Other Topics Concern    Not on file   Social History Narrative    Retired Actor - Pharmacist, hospital.  From [place], [place], moved to area from [place] Strain: Not on [employer] Insecurity: Not on file   Transportation Needs: Not on file   Physical Activity: Insufficiently Active    Days of Exercise per Week: 3 days    Minutes of Exercise per Session: 40 min   Stress: Not on file   Social Connections: Not on file   Intimate Partner Violence: Not on file   Housing Stability: Not on file     Family History   Problem Relation Age of Onset    No Known Problems Brother     No Known Problems Brother     Stroke Father     Dementia Mother        Review of Systems  Constitutional: Negative  GI: Neg GI ROS  Genitourinary: Genitourinary negative    There were no vitals taken for this visit.    Urinalysis  UA - Dipstick  Results for orders placed or performed in visit on 09/10/21   AMB POC URINALYSIS DIP STICK AUTO W/O MICRO   Result Value Ref Range    Color, Urine, POC      Clarity, Urine, POC      Glucose, Urine, POC Negative Negative  Bilirubin, Urine, POC Negative Negative    Ketones, Urine, POC Negative Negative    Specific Gravity, Urine, POC 1.005 1.001 - 1.035    Blood, Urine, POC negative Negative    pH, Urine, POC 5.5 4.6 - 8.0    Protein, Urine, POC Negative Negative    Urobilinogen, POC 0.2     Nitrate, Urine, POC negative Negative    Leukocyte Esterase, Urine, POC Negative Negative       UA - Micro  WBC - 0  RBC - 0  Bacteria - 0  Epith - 0    Physical  Exam    Assessment and Plan    ICD-10-CM    1. BPH with obstruction/lower urinary tract symptoms  N40.1 AMB POC URINALYSIS DIP STICK AUTO W/O MICRO    N13.8       2. Prostate cancer ([place])  C61 PSA, Diagnostic          Orders Placed This Encounter   Procedures    PSA, Diagnostic     Standing Status:   Future     Standing Expiration Date:   09/10/2022    AMB POC URINALYSIS DIP STICK AUTO W/O MICRO   Voiding better on dutasteride and tamsulosin. Continue AS.    Follow-up and Dispositions    Return in about 6 months (around 03/11/2022) for appt, PSA before.     DRE then.

## 2021-09-20 ENCOUNTER — Encounter: Admit: 2021-09-20 | Discharge: 2021-09-20 | Payer: MEDICARE | Attending: Urology | Primary: Geriatric Medicine

## 2021-09-20 ENCOUNTER — Encounter: Primary: Geriatric Medicine

## 2021-09-20 DIAGNOSIS — C61 Malignant neoplasm of prostate: Secondary | ICD-10-CM

## 2021-09-20 NOTE — Progress Notes (Signed)
[facility]  [address] [place] [address]  [phone]        Christopher Gonzalez is a 80 y.o. male with a diagnosis of H/O of PCa and significant BPH.    INTERVAL HISTORY: Patient is here today for follow-up.  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  It was found in 1 core.  He also has a long history of BPH.  His PSA was high 14 not long ago.  His gland measures approximately 102 cc.    When I last saw him he was started on dutasteride.  His most recent PSA is down to 2.8.  He states his lower urinary tract symptoms have improved on the dutasteride.    Of note he was followed by Dr. Dutch Gray in [facility] previously.  He is undergone 2 negative TRUS biopsies in 2001 and 2017.  He remains on Flomax as well.    IPSS: 11 QOL: 3, mixed     From previous note:  Patient is originally from [place].  He then moved to [facility] where Dr. Dutch Gray was his urologist.  He has since followed with Dr. Delaney Meigs.  He is here today to see me for a second opinion.     He is a very healthy 80 year old male.  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  According to the patient there was just a small amount of cancer in the prostate.  He had a fusion biopsy and 1 of 18 cores showed Gleason 3+3 and approximately 10% of the sample.  His PSAs have been in the 4-6 range.  It was as high as 8.3 in August 2020.  His last MRI was 06/15/2019 and showed no discernible lesion within the prostate.  He underwent a transrectal ultrasound of the of the prostate on 03/08/2021 and his gland measured 130 cc.  He was thought not to be a candidate for Rezum or UroLift.       Of note, he had a negative TRUS biopsy in 2001 and 2017.     He has a long history of BPH.  He has been on 0.8 mg of Flomax for quite some time.  His last IPSS score was 22, and he reported quality of life as "unhappy".  He just recently has started on  dutasteride.          Past medical, family and social histories, as well as medications and allergies, were reviewed and updated in the medical record as appropriate.    PMH:     Past Medical History:   Diagnosis Date    Elevated PSA     Erectile dysfunction     Hypercholesterolemia     Personal history of prostate cancer     Prostate cancer ([facility])        MEDs:     aspirin  atorvastatin  BIOTIN PO  cyanocobalamin  dutasteride  meclizine  S-Adenosylmethionine Tabs  tadalafil  tamsulosin     ALLERGIES:    Allergies   Allergen Reactions    Codeine Nausea And Vomiting       ROS:     Review of Systems   Constitutional: Negative.  Negative for chills, fatigue and fever.   Respiratory: Negative.     Cardiovascular: Negative.    Gastrointestinal: Negative.    Genitourinary: Negative.  Negative for difficulty urinating, dysuria, flank pain, frequency, hematuria and urgency.  Musculoskeletal: Negative.    All other systems reviewed and are negative.     PHYSICAL EXAMINATION    BP (!) 180/74    Pulse (!) 16    Temp 97.9 ??F (36.6 ??C) (Oral)    Resp 22    Ht 5\' 5"  (1.651 m)    Wt 174 lb 8 oz (79.2 kg)    SpO2 99%    BMI 29.04 kg/m??   General: well dressed, well nourished, no acute distress  Skin: no rashes  HEENT: Sclera are clear,normocephalic, atraumatic. no external lesions   Cardiovascular: Reg. Normal perfusion  Respiratory: normal respiratory effort, no JVD, no audible wheezing.  Musculoskeletal: unremarkable with normal function. No embolic signs or cyanosis.   Neurologic exam: intact, no focal deficits, moves all 4 extremities  Psych: normal mood and affect, alert, oriented x 3  LE:  no edema  GI: soft, nontender, no masses, no CVA tenderness  GU: DEFERRED       LABORATORY RESULTS:    Lab Results   Component Value Date/Time    PSA 2.8 09/03/2021 09:24 AM    PSA 14.3 03/15/2021 08:07 AM    PSA 6.1 01/18/2021 02:17 PM          MRI Results:    === 03/29/21 ===    MRI PROSTATE W WO CONTRAST      ASSESSMENT:    [name] is a 80 y.o. male with a diagnosis of H/O of PCa and BPH.  He is doing well from an active surveillance standpoint.  I have recommended we recheck his PSA in 6 months and perform a DRE at that time.  His IPSS today is 11.  He will continue on the dutasteride and Flomax for now.  He does not need any refills.      PLAN:   -IPSS today  -RTC in 6 months with psa and DRE    ________________________________________      I have seen and examined this patient.  I have reviewed and edited the note started by the MA and agree with the outlined plan.  Part of this note was written by using a voice dictation software. The note has been proof read but may still contain some grammatical/other typographical errors.      Merwyn Katos, [facility]

## 2021-09-20 NOTE — Patient Instructions (Signed)
Patient Instructions from Today's Visit    Reason for Visit:  Follow up     Diagnosis Information:  https://www.cancer.net/about-us/asco-answers-patient-education-materials/asco-answers-fact-sheets      Plan:  You do not need to see both Dr.Lowrance and Dr.Monroe. you have expressed interest in keeping your care here at the cancer center.  We will plan to check your psa every six months. We will plan to repeat and MRI in the next year or so.       Follow Up:  In 6 months with labs and office visit     Recent Lab Results:  N/a    Treatment Summary has been discussed and given to patient:   N/a      -------------------------------------------------------------------------------------------------------------------    Patient does express an interest in My Chart.  My Chart log in information explained on the after visit summary printout at the [facility] desk.    Margarette Asal, NCMA

## 2021-12-06 NOTE — Telephone Encounter (Signed)
Requested Prescriptions     Pending Prescriptions Disp Refills    atorvastatin (LIPITOR) 20 MG tablet 90 tablet 3     Sig: Take 1 tablet by mouth daily

## 2021-12-06 NOTE — Telephone Encounter (Signed)
MEDICATION REFILL REQUEST      Name of Medication:  Atorvastatin  Dose:  20 mg  Frequency:  take 20 mg by mouth daily  Quantity:  ?  Days' supply:  ?      Pharmacy Name/Location:  [facility]

## 2021-12-08 MED ORDER — ATORVASTATIN CALCIUM 20 MG PO TABS
20 MG | ORAL_TABLET | Freq: Every day | ORAL | 3 refills | Status: AC
Start: 2021-12-08 — End: 2022-01-04

## 2022-01-03 ENCOUNTER — Encounter
Admit: 2022-01-03 | Discharge: 2022-01-03 | Payer: MEDICARE | Attending: Cardiovascular Disease | Primary: Geriatric Medicine

## 2022-01-03 DIAGNOSIS — R931 Abnormal findings on diagnostic imaging of heart and coronary circulation: Secondary | ICD-10-CM

## 2022-01-03 NOTE — Progress Notes (Signed)
[facility]  [address], [address]  [place] [address]  PHONE: [phone]        01/04/22        NAME:  Christopher Gonzalez  DOB: 1941-02-22  MRN: [id number]     CHIEF COMPLAINT:    No chief complaint on file.    SUBJECTIVE:     No chest pain or palpitation or dizziness.       Medications were all reviewed with the patient today and updated as necessary.   Current Outpatient Medications   Medication Sig    atorvastatin (LIPITOR) 20 MG tablet Take 1 tablet by mouth daily    tadalafil (CIALIS) 20 MG tablet TAKE 1 TABLET BY MOUTH AS NEEDED FOR ERECTILE DYSFUNCTION.    tamsulosin (FLOMAX) 0.4 MG capsule Take 2 capsules by mouth daily    dutasteride (AVODART) 0.5 mg capsule Take 1 capsule by mouth daily    BIOTIN PO Take 5,000 mcg by mouth daily    aspirin 81 MG EC tablet Take 1 tablet by mouth daily    cyanocobalamin 100 MCG tablet Take 1 tablet by mouth daily    meclizine (ANTIVERT) 12.5 MG tablet Take by mouth as needed PRN    S-Adenosylmethionine 200 MG TABS Take 200 mg by mouth daily     No current facility-administered medications for this visit.        Allergies   Allergen Reactions    Codeine Nausea And Vomiting           PHYSICAL EXAM:     Wt Readings from Last 3 Encounters:   01/03/22 181 lb (82.1 kg)   09/20/21 174 lb 8 oz (79.2 kg)   09/03/21 173 lb 3.2 oz (78.6 kg)     BP Readings from Last 3 Encounters:   01/03/22 138/76   09/20/21 (!) 180/74   09/03/21 (!) 146/82       BP 138/76   Pulse 72   Ht '5\' 5"'$  (1.651 m)   Wt 181 lb (82.1 kg)   BMI 30.12 kg/m     Physical Exam  Vitals reviewed.   HENT:      Head: Normocephalic and atraumatic.   Eyes:      Extraocular Movements: Extraocular movements intact.      Pupils: Pupils are equal, round, and reactive to light.   Cardiovascular:      Rate and Rhythm: Normal rate.      Heart sounds: Normal heart sounds.   Pulmonary:      Effort: Pulmonary effort is normal.      Breath sounds: Normal breath sounds.   Abdominal:      General: Abdomen is  flat.      Palpations: Abdomen is soft. There is no mass.   Musculoskeletal:         General: Normal range of motion.      Cervical back: Normal range of motion.   Skin:     General: Skin is warm and dry.   Neurological:      General: No focal deficit present.      Mental Status: He is alert and oriented to person, place, and time.   Psychiatric:         Mood and Affect: Mood normal.         RECENT LABS AND RECORDS REVIEW    T chol 147    Ekg:    Sinus  Rhythm   Prwp.     ASSESSMENT and PLAN  Diagnoses and all orders for this visit:    Hyperlipidemia  Doing well. Continue current regimen.  Agatston coronary artery calcium score greater than 400  Doing well. Continue current regimen.       Return in about 1 year (around 01/04/2023).       Marzetta Board, MD  01/04/2022  7:00 AM

## 2022-01-04 MED ORDER — ATORVASTATIN CALCIUM 20 MG PO TABS
20 MG | ORAL_TABLET | Freq: Every day | ORAL | 3 refills | Status: AC
Start: 2022-01-04 — End: 2022-10-24

## 2022-02-09 ENCOUNTER — Encounter

## 2022-02-11 MED ORDER — TAMSULOSIN HCL 0.4 MG PO CAPS
0.4 MG | ORAL_CAPSULE | Freq: Every day | ORAL | 1 refills | Status: DC
Start: 2022-02-11 — End: 2023-11-27

## 2022-03-04 MED ORDER — DUTASTERIDE 0.5 MG PO CAPS
0.5 MG | ORAL_CAPSULE | ORAL | 3 refills | Status: DC
Start: 2022-03-04 — End: 2022-11-29

## 2022-03-07 ENCOUNTER — Encounter: Payer: MEDICARE | Primary: Geriatric Medicine

## 2022-03-11 ENCOUNTER — Encounter: Payer: MEDICARE | Attending: Geriatric Medicine | Primary: Geriatric Medicine

## 2022-03-14 ENCOUNTER — Encounter: Payer: MEDICARE | Attending: Urology | Primary: Geriatric Medicine

## 2022-03-16 ENCOUNTER — Encounter

## 2022-03-21 ENCOUNTER — Encounter: Admit: 2022-03-21 | Discharge: 2022-03-21 | Payer: MEDICARE | Attending: Urology | Primary: Geriatric Medicine

## 2022-03-21 ENCOUNTER — Encounter: Payer: MEDICARE | Primary: Geriatric Medicine

## 2022-03-21 DIAGNOSIS — C61 Malignant neoplasm of prostate: Secondary | ICD-10-CM

## 2022-03-21 LAB — PSA, DIAGNOSTIC: PSA: 2.5 ng/mL (ref <4.0)

## 2022-03-21 NOTE — Patient Instructions (Addendum)
Patient Instructions from Today's Visit    Reason for Visit:  Follow up    Diagnosis Information:  https://www.cancer.net/about-us/asco-answers-patient-education-materials/asco-answers-fact-sheets      Plan:  Weight gain and fatigue are not common side effects of the dutasteride. Continue to monitor your diet and activity level and see if things improve.     Follow Up:  In 6 months with repeat labs. We will plan to do a prostate exam at your next office visit.     Recent Lab Results:  Not resulted as of this note.     Treatment Summary has been discussed and given to patient:   N/a      -------------------------------------------------------------------------------------------------------------------    Patient does express an interest in My Chart.  My Chart log in information explained on the after visit summary printout at the [facility] desk.    Margarette Asal, NCMA

## 2022-03-21 NOTE — Progress Notes (Unsigned)
[facility]  [address] [place] [address]  [phone]        Christopher Gonzalez is a 81 y.o. male with a diagnosis of  H/O of PCa and significant BPH.        INTERVAL HISTORY:      From previous note:  Patient is here today for follow-up.  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  It was found in 1 core.  He also has a long history of BPH.  His PSA was high 14 not long ago.  His gland measures approximately 102 cc.    When I last saw him he was started on dutasteride.  His most recent PSA is down to 2.8.  He states his lower urinary tract symptoms have improved on the dutasteride.    Of note he was followed by Dr. Dutch Gray in [facility] previously.  He is undergone 2 negative TRUS biopsies in 2001 and 2017.  He remains on Flomax as well.     IPSS: 11 QOL: 3, mixed      From previous note:  Patient is originally from [place].  He then moved to [facility] where Dr. Dutch Gray was his urologist.  He has since followed with Dr. Delaney Meigs.  He is here today to see me for a second opinion.     He is a very healthy 81 year old male.  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  According to the patient there was just a small amount of cancer in the prostate.  He had a fusion biopsy and 1 of 18 cores showed Gleason 3+3 and approximately 10% of the sample.  His PSAs have been in the 4-6 range.  It was as high as 8.3 in August 2020.  His last MRI was 06/15/2019 and showed no discernible lesion within the prostate.  He underwent a transrectal ultrasound of the of the prostate on 03/08/2021 and his gland measured 130 cc.  He was thought not to be a candidate for Rezum or UroLift.       Of note, he had a negative TRUS biopsy in 2001 and 2017.     He has a long history of BPH.  He has been on 0.8 mg of Flomax for quite some time.  His last IPSS score was 22, and he reported quality of life as "unhappy".  He just  recently has started on dutasteride.          Past medical, family and social histories, as well as medications and allergies, were reviewed and updated in the medical record as appropriate.    PMH:     Past Medical History:   Diagnosis Date    Elevated PSA     Erectile dysfunction     Hypercholesterolemia     Personal history of prostate cancer     Prostate cancer (HCC)        MEDs:     aspirin  atorvastatin  BIOTIN PO  cyanocobalamin  dutasteride  meclizine  S-Adenosylmethionine Tabs  tadalafil  tamsulosin     ALLERGIES:    Allergies   Allergen Reactions    Codeine Nausea And Vomiting       ROS:     Review of Systems     PHYSICAL EXAMINATION    There were no vitals taken for this visit.  General: well dressed, well nourished, no acute distress  Skin: no rashes  HEENT: Sclera are clear,normocephalic, atraumatic. no external lesions   Cardiovascular: Reg. Normal perfusion  Respiratory: normal respiratory effort, no JVD, no audible wheezing.  Musculoskeletal: unremarkable with normal function. No embolic signs or cyanosis.   Neurologic exam: intact, no focal deficits, moves all 4 extremities  Psych: normal mood and affect, alert, oriented x 3  LE:  no edema  GI: soft, nontender, no masses, no CVA tenderness  GU: DEFERRED       LABORATORY RESULTS:    Lab Results   Component Value Date/Time    PSA 2.8 09/03/2021 09:24 AM    PSA 14.3 03/15/2021 08:07 AM    PSA 6.1 01/18/2021 02:17 PM        IMAGING:      MRI Results:    === 03/29/21 ===    MRI PROSTATE W WO CONTRAST      ASSESSMENT:    Christopher Gonzalez is a 81 y.o. male with a diagnosis of  H/O of PCa and significant BPH.         PLAN:   -psa  -DRE    -  ________________________________________      I have seen and examined this patient.  I have reviewed and edited the note started by the MA and agree with the outlined plan.  Part of this note was written by using a voice dictation software. The note has been proof read but may still contain some grammatical/other  typographical errors.      Merwyn Katos, [facility]

## 2022-03-28 ENCOUNTER — Encounter: Payer: MEDICARE | Attending: Geriatric Medicine | Primary: Geriatric Medicine

## 2022-03-31 ENCOUNTER — Encounter
Admit: 2022-03-31 | Discharge: 2022-03-31 | Payer: MEDICARE | Attending: Geriatric Medicine | Primary: Geriatric Medicine

## 2022-03-31 ENCOUNTER — Encounter

## 2022-03-31 DIAGNOSIS — Z0000 Encounter for general adult medical examination without abnormal findings: Secondary | ICD-10-CM

## 2022-03-31 NOTE — Patient Instructions (Signed)
A Healthy Heart: Care Instructions  Your Care Instructions     Coronary artery disease, also called heart disease, occurs when a substance called plaque builds up in the vessels that supply oxygen-rich blood to your heart muscle. This can narrow the blood vessels and reduce blood flow. A heart attack happens when blood flow is completely blocked. A high-fat diet, smoking, and other factors increase the risk of heart disease.  Your doctor has found that you have a chance of having heart disease. You can do lots of things to keep your heart healthy. It may not be easy, but you can change your diet, exercise more, and quit smoking. These steps really work to lower your chance of heart disease.  Follow-up care is a key part of your treatment and safety. Be sure to make and go to all appointments, and call your doctor if you are having problems. It's also a good idea to know your test results and keep a list of the medicines you take.  How can you care for yourself at home?  Diet   Use less salt when you cook and eat. This helps lower your blood pressure. Taste food before salting. Add only a little salt when you think you need it. With time, your taste buds will adjust to less salt.    Eat fewer snack items, fast foods, canned soups, and other high-salt, high-fat, processed foods.    Read food labels and try to avoid saturated and trans fats. They increase your risk of heart disease by raising cholesterol levels.    Limit the amount of solid fat-butter, margarine, and shortening-you eat. Use olive, peanut, or canola oil when you cook. Bake, broil, and steam foods instead of frying them.    Eat a variety of fruit and vegetables every day. Dark green, deep orange, red, or yellow fruits and vegetables are especially good for you. Examples include spinach, carrots, peaches, and berries.    Foods high in fiber can reduce your cholesterol and provide important vitamins and minerals. High-fiber foods include  whole-grain cereals and breads, oatmeal, beans, brown rice, citrus fruits, and apples.    Eat lean proteins. Heart-healthy proteins include seafood, lean meats and poultry, eggs, beans, peas, nuts, seeds, and soy products.    Limit drinks and foods with added sugar. These include candy, desserts, and soda pop.   Lifestyle changes   If your doctor recommends it, get more exercise. Walking is a good choice. Bit by bit, increase the amount you walk every day. Try for at least 30 minutes on most days of the week. You also may want to swim, bike, or do other activities.    Do not smoke. If you need help quitting, talk to your doctor about stop-smoking programs and medicines. These can increase your chances of quitting for good. Quitting smoking may be the most important step you can take to protect your heart. It is never too late to quit.    Limit alcohol to 2 drinks a day for men and 1 drink a day for women. Too much alcohol can cause health problems.    Manage other health problems such as diabetes, high blood pressure, and high cholesterol. If you think you may have a problem with alcohol or drug use, talk to your doctor.   Medicines   Take your medicines exactly as prescribed. Call your doctor if you think you are having a problem with your medicine.    If your doctor recommends aspirin, take  the amount directed each day. Make sure you take aspirin and not another kind of pain reliever, such as acetaminophen (Tylenol).   When should you call for help?   Call 911 if you have symptoms of a heart attack. These may include:   Chest pain or pressure, or a strange feeling in the chest.    Sweating.    Shortness of breath.    Pain, pressure, or a strange feeling in the back, neck, jaw, or upper belly or in one or both shoulders or arms.    Lightheadedness or sudden weakness.    A fast or irregular heartbeat.   After you call 911, the operator may tell you to chew 1 adult-strength or 2 to 4 low-dose aspirin.  Wait for an ambulance. Do not try to drive yourself.  Watch closely for changes in your health, and be sure to contact your doctor if you have any problems.  Where can you learn more?  Go to https://www.bennett.info/ and enter F075 to learn more about "A Healthy Heart: Care Instructions."  Current as of: September 7, 2022Content Version: 13.7   2006-2023 Healthwise, Incorporated.   Care instructions adapted under license by [facility]. If you have questions about a medical condition or this instruction, always ask your healthcare professional. [employer] any warranty or liability for your use of this information.      Personalized Preventive Plan for Christopher Gonzalez [facility] - 03/31/2022  Medicare offers a range of preventive health benefits. Some of the tests and screenings are paid in full while other may be subject to a deductible, co-insurance, and/or copay.    Some of these benefits include a comprehensive review of your medical history including lifestyle, illnesses that may run in your family, and various assessments and screenings as appropriate.    After reviewing your medical record and screening and assessments performed today your provider may have ordered immunizations, labs, imaging, and/or referrals for you.  A list of these orders (if applicable) as well as your Preventive Care list are included within your After Visit Summary for your review.    Other Preventive Recommendations:    A preventive eye exam performed by an eye specialist is recommended every 1-2 years to screen for glaucoma; cataracts, macular degeneration, and other eye disorders.  A preventive dental visit is recommended every 6 months.  Try to get at least 150 minutes of exercise per week or 10,000 steps per day on a pedometer .  Order or download the FREE "Exercise & Physical Activity: Your Everyday Guide" from [employer]. Call [phone] or search [employer] online.  You need 1200-1500 mg of calcium and 1000-2000 IU of vitamin D per day. It is possible to meet your calcium requirement with diet alone, but a vitamin D supplement is usually necessary to meet this goal.  When exposed to the sun, use a sunscreen that protects against both UVA and UVB radiation with an SPF of 30 or greater. Reapply every 2 to 3 hours or after sweating, drying off with a towel, or swimming.  Always wear a seat belt when traveling in a car. Always wear a helmet when riding a bicycle or motorcycle.

## 2022-03-31 NOTE — Progress Notes (Signed)
SUBJECTIVE:   Christopher Gonzalez is a 81 y.o. 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, Divorced, son lives here, daughter lives in [place], likes to spend time with grandchildren, loves to read, play golf     Left arm pain radiating to left middle finger since 6-7 weeks; no trauma, getting better, worse at night when he rolls over it,   Nipple area tenderness going on 4-5 weeks; no trauma, no discharge  HTN, Hyperlipidemia - Sees cardiology, no headache  Prostate cancer in 2017-2018, Elevated PSA - watchful waiting with urology; seeing Urology  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       Current Outpatient Medications   Medication Sig Dispense Refill    dutasteride (AVODART) 0.5 MG capsule TAKE 1 CAPSULE EVERY DAY 90 capsule 3    tamsulosin (FLOMAX) 0.4 MG capsule TAKE 2 CAPSULES BY MOUTH DAILY. 180 capsule 1    atorvastatin (LIPITOR) 20 MG tablet Take 1 tablet by mouth daily 90 tablet 3    tadalafil (CIALIS) 20 MG tablet TAKE 1 TABLET BY MOUTH AS NEEDED FOR ERECTILE DYSFUNCTION. 18 tablet 11    BIOTIN PO Take 5,000 mcg by mouth daily      aspirin 81 MG EC tablet Take 1 tablet by mouth daily      cyanocobalamin 100 MCG tablet Take 1 tablet by mouth daily      meclizine (ANTIVERT) 12.5 MG tablet Take by mouth as needed PRN       No current facility-administered medications for this visit.     Allergies   Allergen Reactions    Codeine Nausea And Vomiting     Patient Active Problem List   Diagnosis    Agatston coronary artery calcium score greater than 400    Hyperlipidemia    Hypertension    Impaired  fasting glucose     Past Medical History:   Diagnosis Date    Elevated PSA     Erectile dysfunction     Hypercholesterolemia     Personal history of prostate cancer     Prostate cancer [facility])      Past Surgical History:   Procedure Laterality Date    APPENDECTOMY      1950    CHOLECYSTECTOMY      HERNIA REPAIR      MALIGNANT SKIN LESION EXCISION      basal cell - right cheek     Family History   Problem Relation Age of Onset    No Known Problems Brother     No Known Problems Brother     Stroke Father     Dementia Mother      Social History     Tobacco Use    Smoking status: Former     Packs/day: 0.25     Types: Cigarettes    Smokeless tobacco: Never   Substance Use Topics    Alcohol use: Yes     Alcohol/week: 2.0 standard drinks         Review of Systems  Constitutional:  Negative for fever.   Respiratory:  Negative for cough and shortness of breath.    Cardiovascular:  Negative for chest pain and leg swelling.   Gastrointestinal:  Negative for abdominal pain.   Psychiatric/Behavioral:  Negative for behavioral problems and confusion.        OBJECTIVE:  BP 124/72 (Site: Left Upper Arm, Position: Sitting, Cuff Size: Small Adult)   Pulse 74   Wt 178 lb (80.7 kg)   SpO2 95%   BMI 29.62 kg/m      Physical Exam  Vitals and nursing note reviewed.   Constitutional:       General: He is not in acute distress.  Cardiovascular:      Rate and Rhythm: Normal rate and regular rhythm.   Pulmonary:      Effort: Pulmonary effort is normal.      Breath sounds: No wheezing.   Skin:     Comments: Left nipple area tenderness, no mass felt   Neurological:      General: No focal deficit present.      Mental Status: He is oriented to person, place, and time.   Psychiatric:         Mood and Affect: Mood normal.         Behavior: Behavior normal.       Medical problems and test results were reviewed with the patient today.     Recent Results (from the past 672 hour(s))   PSA, Diagnostic    Collection Time: 03/21/22  9:52 AM   Result  Value Ref Range    PSA 2.5 <4.0 ng/mL         ASSESSMENT and PLAN    Christopher Gonzalez was seen today for medicare awv.    Diagnoses and all orders for this visit:    Medicare annual wellness visit, subsequent    Other hyperlipidemia  -     CBC with Auto Differential; Future  -     Comprehensive Metabolic Panel; Future  -     TSH; Future  -     Lipid Panel; Future    History of prediabetes    Nipple tenderness  -     Luteinizing Hormone; Future  -     Testosterone, free, total; Future  -     Estradiol; Future  -     HCG, Tumor Marker; Future  -     HCG Qualitative, Serum; Future    Hypogonadism in male  -     Luteinizing Hormone; Future  -     Testosterone, free, total; Future  -     Estradiol; Future  -     HCG, Tumor Marker; Future  -     HCG Qualitative, Serum; Future    Acute pain of left shoulder  -     [place] [facility], [facility]    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     Medicare Annual Wellness Visit    Christopher Gonzalez is here for Medicare AWV    Assessment & Plan   Medicare annual wellness visit, subsequent  Other hyperlipidemia  -     CBC with Auto Differential; Future  -     Comprehensive Metabolic Panel; Future  -     TSH; Future  -  Lipid Panel; Future  History of prediabetes  Nipple tenderness  -     Luteinizing Hormone; Future  -     Testosterone, free, total; Future  -     Estradiol; Future  -     HCG, Tumor Marker; Future  -     HCG Qualitative, Serum; Future  Hypogonadism in male  -     Luteinizing Hormone; Future  -     Testosterone, free, total; Future  -     Estradiol; Future  -     HCG, Tumor Marker; Future  -     HCG Qualitative, Serum; Future  Acute pain of left shoulder  -     [place] [facility], [facility]  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  Recommended screening schedule for the next 5-10 years is provided to the patient in written form: see Patient Instructions/AVS.     Return in about 2 months (around 05/31/2022).     Subjective     Patient's complete Health Risk Assessment and screening values have been reviewed and are found in Flowsheets. The following problems were reviewed today and where indicated follow up appointments were made and/or referrals ordered.    Positive Risk Factor Screenings with Interventions:                                   Objective   Vitals:    03/31/22 1319   BP: 124/72   Site: Left Upper Arm   Position: Sitting   Cuff Size: Small Adult   Pulse: 74   SpO2: 95%   Weight: 178 lb (80.7 kg)      Body mass index is 29.62 kg/m.        Allergies   Allergen Reactions    Codeine Nausea And Vomiting     Prior to Visit Medications    Medication Sig Taking? Authorizing Provider   dutasteride (AVODART) 0.5 MG capsule TAKE 1 CAPSULE EVERY DAY Yes Cordelia Poche., MD   tamsulosin (FLOMAX) 0.4 MG capsule TAKE 2 CAPSULES BY MOUTH DAILY. Yes Cordelia Poche., MD   atorvastatin (LIPITOR) 20 MG tablet Take 1 tablet by mouth daily Yes Marzetta Board, MD   tadalafil (CIALIS) 20 MG tablet TAKE 1 TABLET BY MOUTH AS NEEDED FOR ERECTILE DYSFUNCTION. Yes Cordelia Poche., MD   BIOTIN PO Take 5,000 mcg by mouth daily Yes Ar Automatic Reconciliation   aspirin 81 MG EC tablet Take 1 tablet by mouth daily Yes Ar Automatic Reconciliation   cyanocobalamin 100 MCG tablet Take 1 tablet by mouth daily Yes Ar Automatic Reconciliation   meclizine (ANTIVERT) 12.5 MG tablet Take by mouth as needed PRN Yes Ar Automatic Reconciliation       CareTeam (Including outside providers/suppliers regularly involved in providing care):   Patient Care Team:  Sharlyn Bologna, MD as PCP - General  Cordelia Poche., MD as PCP - UROLOGY  Sharlyn Bologna, MD as PCP -  Empaneled Provider     Reviewed and updated this visit:  Tobacco  Allergies  Meds  Problems  Med Hx  Surg Hx  Soc Hx  Fam Hx

## 2022-03-31 NOTE — ACP (Advance Care Planning) (Signed)
Advance Care Planning     Advance Care Planning (ACP) Physician/NP/PA Conversation    Date of Conversation: 03/31/2022  Conducted with: Patient with Offerle: his 2 children        Click here to complete Healthcare Decision Makers including selection of the Healthcare Decision Maker Relationship (ie "Primary")  Today we documented Decision Maker(s) consistent with Legal Next of Kin hierarchy.    Care Preferences:    Hospitalization:  "If your health worsens and it becomes clear that your chance of recovery is unlikely, what would be your preference regarding hospitalization?"  The patient would prefer comfort-focused treatment without hospitalization.    Ventilation:  "If you were unable to breath on your own and your chance of recovery was unlikely, what would be your preference about the use of a ventilator (breathing machine) if it was available to you?"  The patient would NOT desire the use of a ventilator.    Resuscitation:  "In the event your heart stopped as a result of an underlying serious health condition, would you want attempts made to restart your heart, or would you prefer a natural death?"  No, do NOT attempt to resuscitate.    treatment goals, benefit/burden of treatment options, artificial nutrition, ventilation preferences, hospitalization preferences, resuscitation preferences, end of life care preferences (vegetative state/imminent death), and hospice care    Conversation Outcomes / Follow-Up Plan:  ACP complete - no further action today  Reviewed DNR/DNI and patient elects Full Code (Attempt Resuscitation)    Length of Voluntary ACP Conversation in minutes:  16 minutes    Christopher Gonzalez Doreen Beam, MD

## 2022-04-01 LAB — CBC WITH AUTO DIFFERENTIAL
Absolute Immature Granulocyte: 0 10*3/uL (ref 0.0–0.5)
Basophils %: 1 % (ref 0.0–2.0)
Basophils Absolute: 0 10*3/uL (ref 0.0–0.2)
Eosinophils %: 1 % (ref 0.5–7.8)
Eosinophils Absolute: 0 10*3/uL (ref 0.0–0.8)
Hematocrit: 40.3 % — ABNORMAL LOW (ref 41.1–50.3)
Hemoglobin: 13 g/dL — ABNORMAL LOW (ref 13.6–17.2)
Immature Granulocytes: 0 % (ref 0.0–5.0)
Lymphocytes %: 24 % (ref 13–44)
Lymphocytes Absolute: 1.3 10*3/uL (ref 0.5–4.6)
MCH: 29 PG (ref 26.1–32.9)
MCHC: 32.3 g/dL (ref 31.4–35.0)
MCV: 90 FL (ref 82–102)
MPV: 10.6 FL (ref 9.4–12.3)
Monocytes %: 10 % (ref 4.0–12.0)
Monocytes Absolute: 0.6 10*3/uL (ref 0.1–1.3)
Neutrophils %: 64 % (ref 43–78)
Neutrophils Absolute: 3.6 10*3/uL (ref 1.7–8.2)
Platelets: 139 10*3/uL — ABNORMAL LOW (ref 150–450)
RBC: 4.48 M/uL (ref 4.23–5.6)
RDW: 13.6 % (ref 11.9–14.6)
WBC: 5.6 10*3/uL (ref 4.3–11.1)
nRBC: 0 10*3/uL (ref 0.0–0.2)

## 2022-04-01 LAB — TSH: TSH, 3RD GENERATION: 1.43 u[IU]/mL (ref 0.358–3.740)

## 2022-04-01 LAB — COMPREHENSIVE METABOLIC PANEL
ALT: 28 U/L (ref 12–65)
AST: 17 U/L (ref 15–37)
Albumin/Globulin Ratio: 1.1 (ref 0.4–1.6)
Albumin: 3.5 g/dL (ref 3.2–4.6)
Alk Phosphatase: 126 U/L (ref 50–136)
Anion Gap: 5 mmol/L (ref 2–11)
BUN: 18 MG/DL (ref 8–23)
CO2: 27 mmol/L (ref 21–32)
Calcium: 8.8 MG/DL (ref 8.3–10.4)
Chloride: 111 mmol/L — ABNORMAL HIGH (ref 101–110)
Creatinine: 1 MG/DL (ref 0.8–1.5)
Est, Glom Filt Rate: >60 mL/min/{1.73_m2} (ref >60)
Globulin: 3.1 g/dL (ref 2.8–4.5)
Glucose: 92 mg/dL (ref 65–100)
Potassium: 3.7 mmol/L (ref 3.5–5.1)
Sodium: 143 mmol/L (ref 133–143)
Total Bilirubin: 0.6 MG/DL (ref 0.2–1.1)
Total Protein: 6.6 g/dL (ref 6.3–8.2)

## 2022-04-01 LAB — LIPID PANEL
Chol/HDL Ratio: 1.9
Cholesterol, Total: 114 MG/DL (ref <200)
HDL: 60 MG/DL (ref 40–60)
LDL Calculated: 40.6 MG/DL (ref <100)
Triglycerides: 67 MG/DL (ref 35–150)
VLDL Cholesterol Calculated: 13.4 MG/DL (ref 6.0–23.0)

## 2022-04-01 LAB — ESTRADIOL: Estradiol: 34.34 pg/mL

## 2022-04-01 LAB — HCG, SERUM, QUALITATIVE: HCG, Ql.: NEGATIVE

## 2022-04-01 LAB — LUTEINIZING HORMONE: LH: 17.6 m[IU]/mL

## 2022-04-02 LAB — HCG, TUMOR MARKER: HCG, Beta: <1 m[IU]/mL (ref 0–3)

## 2022-04-04 ENCOUNTER — Encounter: Payer: MEDICARE | Primary: Geriatric Medicine

## 2022-04-06 LAB — TESTOSTERONE, FREE, TOTAL
Testosterone, Free: 2.1 pg/mL — ABNORMAL LOW (ref 6.6–18.1)
Testosterone: 597 ng/dL (ref 264–916)

## 2022-04-08 ENCOUNTER — Encounter

## 2022-04-08 NOTE — Other (Signed)
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

## 2022-04-08 NOTE — Telephone Encounter (Signed)
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

## 2022-04-08 NOTE — Telephone Encounter (Signed)
Patient notified per Dr. 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

## 2022-04-21 ENCOUNTER — Inpatient Hospital Stay: Admit: 2022-04-21 | Payer: PRIVATE HEALTH INSURANCE | Primary: Geriatric Medicine

## 2022-04-21 ENCOUNTER — Encounter

## 2022-04-21 DIAGNOSIS — M25512 Pain in left shoulder: Secondary | ICD-10-CM

## 2022-04-21 LAB — CBC WITH AUTO DIFFERENTIAL
Absolute Immature Granulocyte: 0 10*3/uL (ref 0.0–0.5)
Basophils %: 1 % (ref 0.0–2.0)
Basophils Absolute: 0 10*3/uL (ref 0.0–0.2)
Eosinophils %: 2 % (ref 0.5–7.8)
Eosinophils Absolute: 0.1 10*3/uL (ref 0.0–0.8)
Hematocrit: 44.3 % (ref 41.1–50.3)
Hemoglobin: 14 g/dL (ref 13.6–17.2)
Immature Granulocytes: 0 % (ref 0.0–5.0)
Lymphocytes %: 20 % (ref 13–44)
Lymphocytes Absolute: 1.2 10*3/uL (ref 0.5–4.6)
MCH: 28.9 PG (ref 26.1–32.9)
MCHC: 31.6 g/dL (ref 31.4–35.0)
MCV: 91.3 FL (ref 82–102)
MPV: 10.1 FL (ref 9.4–12.3)
Monocytes %: 10 % (ref 4.0–12.0)
Monocytes Absolute: 0.6 10*3/uL (ref 0.1–1.3)
Neutrophils %: 67 % (ref 43–78)
Neutrophils Absolute: 4.1 10*3/uL (ref 1.7–8.2)
Platelets: 151 10*3/uL (ref 150–450)
RBC: 4.85 M/uL (ref 4.23–5.6)
RDW: 14 % (ref 11.9–14.6)
WBC: 6.1 10*3/uL (ref 4.3–11.1)
nRBC: 0.02 10*3/uL (ref 0.0–0.2)

## 2022-04-21 LAB — FOLLICLE STIMULATING HORMONE: FSH: 34.8 m[IU]/mL

## 2022-04-21 LAB — IRON: Iron: 78 ug/dL (ref 35–150)

## 2022-04-21 LAB — FERRITIN: Ferritin: 25 NG/ML (ref 8–388)

## 2022-04-21 LAB — PROLACTIN: Prolactin: 10.2 ng/mL

## 2022-04-21 LAB — FOLATE: Folate: 7.6 ng/mL (ref 3.1–17.5)

## 2022-04-21 LAB — TRANSFERRIN: Transferrin: 281 mg/dL (ref 202–364)

## 2022-04-21 LAB — CORTISOL AM, TOTAL: Cortisol - AM: 11.4 ug/dL (ref 7–25)

## 2022-04-21 LAB — VITAMIN B12: Vitamin B-12: 702 pg/mL (ref 193–986)

## 2022-04-21 LAB — T4, FREE: T4 Free: 0.9 NG/DL (ref 0.78–1.46)

## 2022-04-21 NOTE — Other (Signed)
Christopher Gonzalez  DOB: 29-Apr-1941  Primary: Medicare Part A And B (Medicare)  Secondary: [place] [facility]  [address]  [place] [address]  Phone: [phone]  Fax: [phone] Plan Frequency: 2x/wk for 5 weeks  Plan of Care/Certification Expiration Date: 06/20/22      PT Visit Info:  Plan Frequency: 2x/wk for 5 weeks  Plan of Care/Certification Expiration Date: 06/20/22  Total # of Visits to Date: 1      Visit Count:  1                OUTPATIENT PHYSICAL THERAPY:             OP NOTE TYPE: Initial Assessment 04/21/2022               Episode (L shoulder pain w/ radicular symptoms) Appt Desk         Treatment Diagnosis:  Pain in Left Shoulder (M25.512)  Stiffness of Left Shoulder, Not elsewhere classified (M25.612)  Cervicalgia (M54.2)  Medical/Referring Diagnosis:  Acute pain of left shoulder [M25.512]  Referring Physician:  Sharlyn Bologna, MD  MD Orders:  PT Eval and Treat   Return MD Appt:  2 months  Date of Onset:  Onset Date:  (2 months ago)      Allergies:  Codeine  Restrictions/Precautions:    Restrictions/Precautions: None        Medications Last Reviewed:  04/21/2022     SUBJECTIVE   History of Injury/Illness (Reason for Referral):  Pt unsure how pain began. Noticed it in L shoulder area then started getting nerve pain down his L arm to his middle finger. 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 No imaging or injury that he can remember. Notes his arm feels weak at times too.   Patient Stated Goal(s):  "no pain in arm"  Initial:     3/10 Post Session:     3/10  Past Medical History/Comorbidities:   Christopher Gonzalez  has a past medical history of Elevated PSA, Erectile dysfunction, Hypercholesterolemia, Personal history of prostate cancer, and Prostate cancer (Gleed).  Christopher Gonzalez  has a past surgical history that includes Cholecystectomy; malignant skin lesion excision; Appendectomy; and hernia repair.  Social History/Living Environment:   Lives With:  Alone  Type of Home: Apartment  Home Layout: One level  Home Access: Level entry     Prior Level of Function/Work/Activity:   Prior level of function: Independent  Occupation: Part time employment  Type of Occupation: Software engineer:   Does the patient/guardian have any barriers to learning?: No barriers  Will there be a co-learner?: No  What is the preferred language of the patient/guardian?: English  Is an interpreter required?: No  How does the patient/guardian prefer to learn new concepts?: Listening; Reading; Demonstration; Pictures/Videos     Fall Risk Scale:   Morse Total Score: 15  Morse Fall Risk: Low (0-24)       Dominant Side:  right handed      OBJECTIVE      Date:  04/21/22 Date:   Date:     Shoulder Flex/Scapt R: 5/5, 160*  L: 4+/5, 160*     Shoulder ABD R: 5/5, 180*  L: 4+/5, 180*     Shoulder ER R: 5/5, 70*  L: 5/5, 70*     Shoulder IR R: 5/5, 80*  L: 4+/5 some pain, 70*  Elbow Flex R: 5/5  L: 5/5     Elbow Ext R: 5/5  L: 5/5     Cervical rotation R: 50*  L: 35*     Cervical side bending R: 30*  L: 35*     Cervical Flex 40*     Cervical Ext 40*       Sensation:   Intact w/ light touch to BUEs    Test/Function:  Spurling's: no pain w/ compression, traction felt good  Neer: positive on L  Hawkins Kennedy: positive on L  ER: no pain  Empty Can Test: no pain    ASSESSMENT   Initial Assessment:  Christopher Gonzalez presents to PT eval w/ c/o L shoulder pain and weakness w/ radicular symptoms down his arm. Currently, he displays decreased posture, cervical ROM, L shoulder ROM, and L shoulder strength. With palpation, he was tight and tender to his occipitals, upper trap, and levator on the L. Christopher Gonzalez will benefit from continued skilled PT services to improve deficits listed below and to progress towards his PLOF.     Problem List: (Impacting functional limitations):    Body Structures, Functions, Activity Limitations Requiring Skilled Therapeutic Intervention: Decreased ADL status; Decreased  ROM; Decreased balance; Decreased endurance; Decreased strength; Decreased posture; Increased pain     Therapy Prognosis:   Therapy Prognosis: Good     Initial Assessment Complexity:   Decision Making: Low Complexity    PLAN   Effective Dates: 04/21/22 TO Plan of Care/Certification Expiration Date: 06/20/22     Frequency/Duration: Plan Frequency: 2x/wk for 5 weeks     Interventions Planned (Treatment may consist of any combination of the following):    Current Treatment Recommendations: Strengthening; ROM; Manual; Neuromuscular re-education; Endurance training; Home exercise program; Modalities; Dry needling     Goals: (Goals have been discussed and agreed upon with patient.)  Short Term Goals: 4 weeks  1. Pt will be independent w/ HEP in order to improve outcomes and decrease pain levels.   2. Pt will have L shoulder IR ROM of 80* or more in order to improve functional mobility.   3. Pt will have L shoulder strength of 5/5 or greater in all major motions in order to perform overhead tasks.    Long Term Goals: 5 weeks  1. Pt will have QUICKDASH score of 20 or less in order to display decreased pain and decreased functional impairments.   2. Pt will have cervical rotation ROM of 50* or more bilaterally in order to improve his safety w/ driving.   3. Pt will have cervical side bending motion of 35* or more in order to improve his safety w/ driving.            Outcome Measure:   Tool Used: Disabilities of the Arm, Shoulder and Hand (DASH) Questionnaire - Quick Version  Score:  Initial: 24/55  Most Recent: X/55 (Date: -- )   Interpretation of Score: The DASH is designed to measure the activities of daily living in person's with upper extremity dysfunction or pain.  Each section is scored on a 1-5 scale, 5 representing the greatest disability.  The scores of each section are added together for a total score of 55.      Tool Used: Neck Disability Index (NDI)  Score:  Initial: 2/50  Most Recent: X/50 (Date: -- )    Interpretation of Score: The Neck Disability Index is a revised form of the Oswestry Low Back Pain Index and is designed to  measure the activities of daily living in person's with neck pain.  Each section is scored on a 0-5 scale, 5 representing the greatest disability.  The scores of each section are added together for a total score of 50.     Medical Necessity:   > Patient is expected to demonstrate progress in strength, range of motion, balance, and coordination to increase independence with functional tasks.  Reason For Services/Other Comments:  > Patient continues to demonstrate capacity to improve strength, ROM, balance, mobility which will increase independence.  Total Duration:  Time In: 0730  Time Out: 0817    Regarding [facility] therapy, I certify that the treatment plan above will be carried out by a therapist or under their direction.  Thank you for this referral,  [name], PT     Referring Physician Signature: Sharlyn Bologna, MD                    Post Session Pain  Charge Capture  PT Visit Info MD Guidelines  MyChart  Access Code: [email or web address]  URL: [email or web address]  Date: 04/21/2022  Prepared by: [name]    Exercises  - Seated Cervical Sidebending Stretch  - 1 x daily - 7 x weekly - 1 sets - 10 reps - 5 hold  - Seated Levator Scapulae Stretch  - 1 x daily - 7 x weekly - 1 sets - 10 reps - 5 hold  - Doorway Pec Stretch at 120 Degrees Abduction  - 1 x daily - 7 x weekly - 1 sets - 10 reps - 5 hold  - Seated Thoracic Lumbar Extension with Pectoralis Stretch  - 1 x daily - 7 x weekly - 1 sets - 10 reps - 5 hold  - Seated Scapular Retraction  - 1 x daily - 7 x weekly - 2 sets - 10 reps - 5 hold  - Supine Chin Tuck  - 1 x daily - 7 x weekly - 2 sets - 10 reps - 5 hold

## 2022-04-21 NOTE — Progress Notes (Signed)
Christopher Gonzalez  DOB: 1941/06/08  Primary: Medicare Part A And B (Medicare)  Secondary: [place] [facility]  [address]  [address]  [place] [address]  Phone: [phone]  Fax: [phone] Plan Frequency: 2x/wk for 5 weeks  Plan of Care/Certification Expiration Date: 06/20/22      >PT Visit Info:  Plan Frequency: 2x/wk for 5 weeks  Plan of Care/Certification Expiration Date: 06/20/22  Total # of Visits to Date: 1      Visit Count:  1    OUTPATIENT PHYSICAL THERAPY:OP NOTE TYPE: OP Note Type: Treatment Note 04/21/2022       Episode  }Appt Desk             Treatment Diagnosis:  Pain in Left Shoulder (M25.512)  Stiffness of Left Shoulder, Not elsewhere classified (M25.612)  Cervicalgia (M54.2)  Medical/Referring Diagnosis:  Acute pain of left shoulder [M25.512]  Referring Physician:  Sharlyn Bologna, MD  MD Orders:  PT Eval and Treat   Date of Onset:  Onset Date:  (2 months ago)     Allergies:   Codeine  Restrictions/Precautions:  Restrictions/Precautions: None     Interventions Planned (Treatment may consist of any combination of the following):    Current Treatment Recommendations: Strengthening; ROM; Manual; Neuromuscular re-education; Endurance training; Home exercise program; Modalities; Dry needling     >Subjective Comments:  No idea what brough on symptoms. Starts on back side of shoulder and comes down his L arm.  >Initial:     3/10>Post Session:       3/10  Medications Last Reviewed:  04/21/2022  Updated Objective Findings:  See evaluation note from today  Treatment   THERAPEUTIC EXERCISE: (10 minutes):    Exercises per grid below to improve mobility, strength, balance, and coordination.  Required minimal verbal and tactile cues to promote proper body alignment, promote proper body posture, and promote proper body mechanics.  Progressed resistance, range, repetitions, and complexity of movement as indicated.  MANUAL THERAPY: (10 minutes):   Joint mobilization and Soft  tissue mobilization was utilized and necessary because of the patient's restricted joint motion, painful spasm, loss of articular motion, and restricted motion of soft tissue.   MODALITIES: (4 mins)       *  Hot Pack Therapy in order to provide analgesia and relieve muscle spasm.    Date:  04/21/22 Date:   Date:     Activity/Exercise Parameters Parameters Parameters   Chin tuck 5x seated  10x supine     Upper trap stretch 3x L     Levator stretch 3x L     Pec stretch 3x in door     Thoracic extension 3x in chair     Scapular retraction 10x seated w/ cues               Treatment/Session Summary:    >Treatment Assessment:  Pt had improved pain in upper trap area with STM and improved cervical motion. Still had some shoulder discomfort but overall felt more mobile. Pt understood his HEP.  Communication/Consultation:  None today  Equipment provided today:  HEP  Recommendations/Intent for next treatment session: Next visit will focus on progression of functional tasks as tolerated.    >Total Treatment Billable Duration:  10 minutes therex, 10 mins manual   Time In: 0730  Time Out: 0817    [name], PT       Charge Capture  }Post Session Pain  PT Visit Info  [place] Portal  MD Guidelines  Scanned Media  Benefits  MyChart    Future Appointments   Date Time Provider [place]   05/09/2022  3:15 PM [name], PT [facility]   05/11/2022  2:15 PM [name], PT [facility]   05/16/2022  7:30 AM [name], PT [facility]   05/18/2022  7:30 AM [name], PT [facility]   05/23/2022  7:30 AM [name], PT [facility]   05/25/2022  7:30 AM [name], PT [facility]   05/30/2022  7:30 AM [name], PT [facility]   06/01/2022  7:30 AM [name], PT [facility]   06/07/2022  7:30 AM [name], PT [facility]   06/09/2022  7:30 AM [name], PT [facility]   06/15/2022  8:20 AM Racheal Patches Doreen Beam, MD [facility]   09/23/2022 10:00 AM [facility]   09/23/2022 10:45 AM Amado Coe, MD [facility]     Access Code: [email or web address]  URL: [email or web address]  Date: 04/21/2022  Prepared by: [name]    Exercises  - Seated Cervical Sidebending Stretch  - 1 x daily - 7 x weekly - 1 sets - 10 reps - 5 hold  - Seated Levator Scapulae Stretch  - 1 x daily - 7 x weekly - 1 sets - 10 reps - 5 hold  - Doorway Pec Stretch at 120 Degrees Abduction  - 1 x daily - 7 x weekly - 1 sets - 10 reps - 5 hold  - Seated Thoracic Lumbar Extension with Pectoralis Stretch  - 1 x daily - 7 x weekly - 1 sets - 10 reps - 5 hold  - Seated Scapular Retraction  - 1 x daily - 7 x weekly - 2 sets - 10 reps - 5 hold  - Supine Chin Tuck  - 1 x daily - 7 x weekly - 2 sets - 10 reps - 5 hold

## 2022-04-28 ENCOUNTER — Telehealth

## 2022-04-28 LAB — TESTOSTERONE, FREE, TOTAL
Testosterone, Free: 3.7 pg/mL — ABNORMAL LOW (ref 6.6–18.1)
Testosterone: 662 ng/dL (ref 264–916)

## 2022-04-28 NOTE — Telephone Encounter (Signed)
-----   Message from Sharlyn Bologna, MD sent at 04/28/2022 11:10 AM EDT -----  Labs show low testosterone level - please refer to Endocrinology for suspected Hypogonadism, if ok with him.

## 2022-04-28 NOTE — Other (Signed)
Labs show low testosterone level - please refer to Endocrinology for suspected Hypogonadism, if ok with him.

## 2022-04-28 NOTE — Telephone Encounter (Signed)
Patient notified of lab results and referral placed. Pt. Verbalized understanding.

## 2022-05-09 ENCOUNTER — Inpatient Hospital Stay: Admit: 2022-05-09 | Payer: PRIVATE HEALTH INSURANCE | Primary: Geriatric Medicine

## 2022-05-09 DIAGNOSIS — M25512 Pain in left shoulder: Secondary | ICD-10-CM

## 2022-05-09 NOTE — Progress Notes (Signed)
Christopher Gonzalez  DOB: Feb 17, 1941  Primary: Medicare Part A And B (Medicare)  Secondary: [place] [facility]  [address]  [address]  [place] [address]  Phone: [phone]  Fax: [phone] Plan Frequency: 2x/wk for 5 weeks  Plan of Care/Certification Expiration Date: 06/20/22      >PT Visit Info:  Plan Frequency: 2x/wk for 5 weeks  Plan of Care/Certification Expiration Date: 06/20/22  Total # of Visits to Date: 2      Visit Count:  2    OUTPATIENT PHYSICAL THERAPY:OP NOTE TYPE: OP Note Type: Treatment Note 05/09/2022       Episode  }Appt Desk             Treatment Diagnosis:  Pain in Left Shoulder (M25.512)  Stiffness of Left Shoulder, Not elsewhere classified (M25.612)  Cervicalgia (M54.2)  Medical/Referring Diagnosis:  Acute pain of left shoulder [M25.512]  Referring Physician:  Sharlyn Bologna, MD  MD Orders:  PT Eval and Treat   Date of Onset:  Onset Date:  (2 months ago)     Allergies:   Codeine  Restrictions/Precautions:  Restrictions/Precautions: None     Interventions Planned (Treatment may consist of any combination of the following):    Current Treatment Recommendations: Strengthening; ROM; Manual; Neuromuscular re-education; Endurance training; Home exercise program; Modalities; Dry needling     >Subjective Comments:  Pain is up and down. Heat is helping.  >Initial:     3/10>Post Session:       2/10  Medications Last Reviewed:  05/09/2022  Updated Objective Findings:  None Today  Treatment   THERAPEUTIC EXERCISE: (30 minutes):    Exercises per grid below to improve mobility, strength, balance, and coordination.  Required minimal verbal and tactile cues to promote proper body alignment, promote proper body posture, and promote proper body mechanics.  Progressed resistance, range, repetitions, and complexity of movement as indicated.  MANUAL THERAPY: (10 minutes):   Joint mobilization and Soft tissue mobilization was utilized and necessary because of the patient's  restricted joint motion, painful spasm, loss of articular motion, and restricted motion of soft tissue.   Date:  05/09/22   Parameters   PROM to L shoulder in supine   Mobilizations to L shoulder in supine     MODALITIES: (5 mins)       *  Hot Pack Therapy in order to provide analgesia and relieve muscle spasm.    Date:  04/21/22 Date:  05/09/22 Date:     Activity/Exercise Parameters Parameters Parameters   UBE  4/4 2.0    Chin tuck 5x seated  10x supine     Upper trap stretch 3x L     Levator stretch 3x L     Pec stretch 3x in door     Thoracic extension 3x in chair     Scapular retraction 10x seated w/ cues     Shoulder flexion  2x10 B seated 3#    Shoulder abduction  2x10 B seated 3#    Rows  2x10 B prone    Ts  2x10 B prone    Ms  2x10 B prone    Ys  10x B prone    Serratus press  2x10 B 3# supine    ER  2x10 B 3# sidelying    IR  2x10 B 3# sidelying        Treatment/Session Summary:    >Treatment Assessment:  Pt tolerated increased shoulder therex. Overall he  still has pin point tenderness to L deltoid and may benefit from dry needles. He is going to be out of town next week so an updated HEP may be issued if he had no change in pain w/ today's session.  Communication/Consultation:  None today  Equipment provided today:  HEP  Recommendations/Intent for next treatment session: Next visit will focus on progression of functional tasks as tolerated.    >Total Treatment Billable Duration:  30 minutes therex, 10 mins manual   Time In: 1515  Time Out: [place], PT       Charge Capture  }Post Session Pain  PT Visit Info  [facility] Portal  MD Guidelines  Scanned Media  Benefits  MyChart    Future Appointments   Date Time Provider [place]   05/11/2022  2:30 PM [name], PT [facility]   05/30/2022  7:30 AM [name], PT [facility]   06/01/2022  7:30 AM [name], PT [facility]   06/01/2022 10:30 AM Lawson Radar, APRN - CNP END [facility]   06/07/2022  7:30 AM [name], PT  [facility]   06/09/2022  7:30 AM [name], PT [facility]   06/15/2022  8:20 AM Racheal Patches Doreen Beam, MD [facility]   09/23/2022 10:00 AM [facility]   09/23/2022 10:45 AM Amado Coe, MD [facility]     Access Code: [email or web address]  URL: [email or web address]  Date: 04/21/2022  Prepared by: [name]    Exercises  - Seated Cervical Sidebending Stretch  - 1 x daily - 7 x weekly - 1 sets - 10 reps - 5 hold  - Seated Levator Scapulae Stretch  - 1 x daily - 7 x weekly - 1 sets - 10 reps - 5 hold  - Doorway Pec Stretch at 120 Degrees Abduction  - 1 x daily - 7 x weekly - 1 sets - 10 reps - 5 hold  - Seated Thoracic Lumbar Extension with Pectoralis Stretch  - 1 x daily - 7 x weekly - 1 sets - 10 reps - 5 hold  - Seated Scapular Retraction  - 1 x daily - 7 x weekly - 2 sets - 10 reps - 5 hold  - Supine Chin Tuck  - 1 x daily - 7 x weekly - 2 sets - 10 reps - 5 hold

## 2022-05-11 ENCOUNTER — Inpatient Hospital Stay: Payer: MEDICARE | Primary: Geriatric Medicine

## 2022-05-11 NOTE — Progress Notes (Signed)
Christopher Gonzalez  DOB: [birth year]  Primary: Medicare Part A And B  Secondary: [place] [facility]  [address]  [place] [address]  Phone: [phone]  Fax: [phone]    PT Visit Info:    Total # of Visits to Date: 2  Canceled Appointment: 1     OT Visit Info:  No data recorded    OUTPATIENT THERAPY: 05/11/2022  Episode  Appt Desk        [facility] cancelled [facility] appointment for today due to  being out of town .  Will plan to follow up next during next appointment.  Thank you,  [name], PT    Future Appointments   Date Time Provider [place]   05/11/2022  2:30 PM [name], PT [facility]   05/30/2022  7:30 AM [name], PT [facility]   06/01/2022  7:30 AM [name], PT [facility]   06/01/2022 10:30 AM Lawson Radar, APRN - CNP END [facility]   06/07/2022  7:30 AM [name], PT [facility]   06/09/2022  7:30 AM [name], PT [facility]   06/15/2022  8:20 AM Sharlyn Bologna, MD [facility]   09/23/2022 10:00 AM [facility]   09/23/2022 10:45 AM Amado Coe, MD [facility]

## 2022-05-16 ENCOUNTER — Encounter: Payer: PRIVATE HEALTH INSURANCE | Primary: Geriatric Medicine

## 2022-05-18 ENCOUNTER — Encounter: Payer: MEDICARE | Primary: Geriatric Medicine

## 2022-05-23 ENCOUNTER — Encounter: Payer: PRIVATE HEALTH INSURANCE | Primary: Geriatric Medicine

## 2022-05-25 ENCOUNTER — Encounter: Payer: PRIVATE HEALTH INSURANCE | Primary: Geriatric Medicine

## 2022-05-30 ENCOUNTER — Inpatient Hospital Stay: Admit: 2022-05-30 | Payer: MEDICARE | Primary: Geriatric Medicine

## 2022-05-30 NOTE — Progress Notes (Signed)
Christopher Gonzalez  DOB: [birth year]  Primary: Medicare Part A And B (Medicare)  Secondary: [place] [facility]  [address]  [address]  [place] [address]  Phone: [phone]  Fax: [phone] Plan Frequency: 2x/wk for 5 weeks  Plan of Care/Certification Expiration Date: 06/20/22      >PT Visit Info:  Plan Frequency: 2x/wk for 5 weeks  Plan of Care/Certification Expiration Date: 06/20/22  Total # of Visits to Date: 3  Canceled Appointment: 1      Visit Count:  3    OUTPATIENT PHYSICAL THERAPY:OP NOTE TYPE: OP Note Type: Treatment Note 05/30/2022       Episode  }Appt Desk             Treatment Diagnosis:  Pain in Left Shoulder (M25.512)  Stiffness of Left Shoulder, Not elsewhere classified (M25.612)  Cervicalgia (M54.2)  Medical/Referring Diagnosis:  Acute pain of left shoulder [M25.512]  Referring Physician:  Sharlyn Bologna, MD  MD Orders:  PT Eval and Treat   Date of Onset:  Onset Date:  (2 months ago)     Allergies:   Codeine  Restrictions/Precautions:  Restrictions/Precautions: None     Interventions Planned (Treatment may consist of any combination of the following):    Current Treatment Recommendations: Strengthening; ROM; Manual; Neuromuscular re-education; Endurance training; Home exercise program; Modalities; Dry needling     >Subjective Comments:  Pain is better. Still wakes him up if he falls asleep on that side.  >Initial:     2/10>Post Session:       0/10  Medications Last Reviewed:  05/30/2022  Updated Objective Findings:  None Today  Treatment   THERAPEUTIC EXERCISE: (25 minutes):    Exercises per grid below to improve mobility, strength, balance, and coordination.  Required minimal verbal and tactile cues to promote proper body alignment, promote proper body posture, and promote proper body mechanics.  Progressed resistance, range, repetitions, and complexity of movement as indicated.  MANUAL THERAPY: (15 minutes):   Joint mobilization and Soft tissue  mobilization was utilized and necessary because of the patient's restricted joint motion, painful spasm, loss of articular motion, and restricted motion of soft tissue.   Date:  05/09/22 Date:  05/30/22   Parameters Parameters   PROM to L shoulder in supine Mobilizations to L shoulder in supine   Mobilizations to L shoulder in supine Cervical traction     STM to L upper trap and pec in supine     MODALITIES: (0 mins)       *  Hot Pack Therapy in order to provide analgesia and relieve muscle spasm.    Date:  04/21/22 Date:  05/09/22 Date:  05/30/22   Activity/Exercise Parameters Parameters Parameters   UBE  4/4 2.0 4/4 2.0    Chin tuck 5x seated  10x supine     Upper trap stretch 3x L     Levator stretch 3x L     Pec stretch 3x in door     Thoracic extension 3x in chair     Scapular retraction 10x seated w/ cues     Shoulder flexion  2x10 B seated 3# 2x10 B standing 3#   Shoulder abduction  2x10 B seated 3# 2x10 B standing 3#   Rows  2x10 B prone 2x10 prone    Ts  2x10 B prone 2x10 prone   Ms  2x10 B prone 2x10 prone   Ys  10x B prone 10x B prone  Serratus press  2x10 B 3# supine 2x10 3# supine   ER  2x10 B 3# sidelying 2x10 B 3# sidelying   IR  2x10 B 3# sidelying 2x10 B 3# sidelying       Treatment/Session Summary:    >Treatment Assessment:  New HEP issued today. Pt was challenged w/ exercises, especially prone scapular work. Added in some cervical traction this session to see if radicular symptoms improve.  Communication/Consultation:  None today  Equipment provided today:  HEP  Recommendations/Intent for next treatment session: Next visit will focus on progression of functional tasks as tolerated.    >Total Treatment Billable Duration:  25 minutes therex, 15 mins manual   Time In: 0730  Time Out: 0815    [name], PT       Charge Capture  }Post Session Pain  PT Visit Info  MedBridge Portal  MD Guidelines  Scanned Media  Benefits  MyChart    Future Appointments   Date Time Provider [place]   06/01/2022   7:30 AM [name], PT [facility]   06/01/2022 10:30 AM [facility], APRN - CNP END [facility] AMB   06/07/2022  1:45 PM [name], PT [facility]   06/09/2022  7:30 AM [name], PT [facility]   06/15/2022  8:20 AM Racheal Patches Doreen Beam, MD [facility] AMB   09/23/2022 10:00 AM [place] [facility]   09/23/2022 10:45 AM Amado Coe, MD [facility] AMB     Access Code: [email or web address]  URL: [email or web address]  Date: 05/30/2022  Prepared by: [name]    Exercises  - Seated Cervical Sidebending Stretch  - 1 x daily - 7 x weekly - 1 sets - 10 reps - 5 hold  - Seated Levator Scapulae Stretch  - 1 x daily - 7 x weekly - 1 sets - 10 reps - 5 hold  - Doorway Pec Stretch at 120 Degrees Abduction  - 1 x daily - 7 x weekly - 1 sets - 10 reps - 5 hold  - Seated Thoracic Lumbar Extension with Pectoralis Stretch  - 1 x daily - 7 x weekly - 1 sets - 10 reps - 5 hold  - Seated Scapular Retraction  - 1 x daily - 7 x weekly - 2 sets - 10 reps - 5 hold  - Supine Chin Tuck  - 1 x daily - 7 x weekly - 2 sets - 10 reps - 5 hold  - Prone Shoulder Row  - 1 x daily - 3 x weekly - 2 sets - 10 reps - 5 hold  - Prone Scapular Slide with Shoulder Extension  - 1 x daily - 3 x weekly - 2 sets - 10 reps - 5 hold  - Prone Scapular Retraction Arms at Side  - 1 x daily - 3 x weekly - 2 sets - 10 reps - 5 hold  - Prone Scapular Retraction Y  - 1 x daily - 3 x weekly - 1 sets - 10 reps - 5 hold  - Supine Scapular Protraction in Flexion with Dumbbells  - 1 x daily - 3 x weekly - 2 sets - 10 reps - 5 hold  - Sidelying Shoulder ER with Towel and Dumbbell  - 1 x daily - 3 x weekly - 2 sets - 10 reps - 5 hold  - Sidelying Shoulder Internal Rotation with Dumbbell  - 1 x daily - 3 x weekly - 2 sets - 10 reps -  5 hold  - Standing Shoulder Flexion to 90 Degrees with Dumbbells  - 1 x daily - 3 x weekly - 3 sets - 10 reps - 5 hold  - Shoulder Abduction with Dumbbells - Thumbs Up  - 1 x daily - 3 x weekly - 2 sets -  10 reps - 5 hold

## 2022-06-01 ENCOUNTER — Inpatient Hospital Stay: Admit: 2022-06-01 | Payer: MEDICARE | Primary: Geriatric Medicine

## 2022-06-01 ENCOUNTER — Ambulatory Visit: Admit: 2022-06-01 | Discharge: 2022-06-01 | Payer: MEDICARE | Attending: Family | Primary: Geriatric Medicine

## 2022-06-01 DIAGNOSIS — R7989 Other specified abnormal findings of blood chemistry: Secondary | ICD-10-CM

## 2022-06-01 NOTE — Progress Notes (Signed)
Christopher Gonzalez  DOB: Mar 01, 1941  Primary: Medicare Part A And B (Medicare)  Secondary: [place] [facility]  [address]  [address]  [place] [address]  Phone: [phone]  Fax: [phone] Plan Frequency: 2x/wk for 5 weeks  Plan of Care/Certification Expiration Date: 06/20/22      >PT Visit Info:  Plan Frequency: 2x/wk for 5 weeks  Plan of Care/Certification Expiration Date: 06/20/22  Total # of Visits to Date: 4  Canceled Appointment: 1      Visit Count:  4    OUTPATIENT PHYSICAL THERAPY:OP NOTE TYPE: OP Note Type: Treatment Note 06/01/2022       Episode  }Appt Desk             Treatment Diagnosis:  Pain in Left Shoulder (M25.512)  Stiffness of Left Shoulder, Not elsewhere classified (M25.612)  Cervicalgia (M54.2)  Medical/Referring Diagnosis:  Acute pain of left shoulder [M25.512]  Referring Physician:  Sharlyn Bologna, MD  MD Orders:  PT Eval and Treat   Date of Onset:  Onset Date:  (2 months ago)     Allergies:   Codeine  Restrictions/Precautions:  Restrictions/Precautions: None     Interventions Planned (Treatment may consist of any combination of the following):    Current Treatment Recommendations: Strengthening; ROM; Manual; Neuromuscular re-education; Endurance training; Home exercise program; Modalities; Dry needling     >Subjective Comments:  Pt has no pain today but was sore and stiff yesterday.  >Initial:     0/10>Post Session:       0/10  Medications Last Reviewed:  06/01/2022  Updated Objective Findings:  None Today  Treatment   THERAPEUTIC EXERCISE: (20 minutes):    Exercises per grid below to improve mobility, strength, balance, and coordination.  Required minimal verbal and tactile cues to promote proper body alignment, promote proper body posture, and promote proper body mechanics.  Progressed resistance, range, repetitions, and complexity of movement as indicated.  MANUAL THERAPY: (10 minutes):   Joint mobilization and Soft tissue mobilization was  utilized and necessary because of the patient's restricted joint motion, painful spasm, loss of articular motion, and restricted motion of soft tissue.   Date:  05/09/22 Date:  05/30/22 Date:  06/01/22   Parameters Parameters Parameters   PROM to L shoulder in supine Mobilizations to L shoulder in supine Mobilization to L shoulder in supine   Mobilizations to L shoulder in supine Cervical traction  PROM to L shoulder in supine    STM to L upper trap and pec in supine      MODALITIES: (0 mins)       *  Hot Pack Therapy in order to provide analgesia and relieve muscle spasm.    Date:  04/21/22 Date:  05/09/22 Date:  05/30/22 Date:  06/01/22   Activity/Exercise Parameters Parameters Parameters Parameters   UBE  4/4 2.0 4/4 2.0  4/4 2.0    Chin tuck 5x seated  10x supine      Upper trap stretch 3x L      Levator stretch 3x L      Pec stretch 3x in door      Thoracic extension 3x in chair      Scapular retraction 10x seated w/ cues      Shoulder flexion  2x10 B seated 3# 2x10 B standing 3# 2x10 L sidelying 2#   Shoulder abduction  2x10 B seated 3# 2x10 B standing 3# 2x10 L sidelying 2#   2x10  horizontal sidelying 2#   Rows  2x10 B prone 2x10 prone  2x10 prone   Ts  2x10 B prone 2x10 prone 2x10 prone   Ms  2x10 B prone 2x10 prone 2x10 prone   Ys  10x B prone 10x B prone    Serratus press  2x10 B 3# supine 2x10 3# supine 2x10 lime band supine   ER  2x10 B 3# sidelying 2x10 B 3# sidelying 20x lime band supine B   IR  2x10 B 3# sidelying 2x10 B 3# sidelying    Diagonal    20x lime band up and out  20x lime band down and across       Treatment/Session Summary:    >Treatment Assessment:  Pt had reduced stiffness post session. Added in new shoulder strengthening this session which challenged him a good bit. No pain post session.  Communication/Consultation:  None today  Equipment provided today:  HEP  Recommendations/Intent for next treatment session: Next visit will focus on progression of functional tasks as tolerated.    >Total  Treatment Billable Duration: 20 minutes therex, 10 mins manual   Time In: 0730  Time Out: 0803    [name], PT       Charge Capture  }Post Session Pain  PT Visit Info  MedBridge Portal  MD Guidelines  Scanned Media  Benefits  MyChart    Future Appointments   Date Time Provider [place]   06/01/2022 10:30 AM [facility], APRN - CNP END [facility] AMB   06/07/2022  1:45 PM [name], PT [facility]   06/09/2022  7:30 AM [name], PT [facility]   06/15/2022  8:20 AM Racheal Patches Doreen Beam, MD [facility] AMB   09/23/2022 10:00 AM [place] [facility]   09/23/2022 10:45 AM Amado Coe, MD [facility] AMB     Access Code: [email or web address]  URL: [email or web address]  Date: 05/30/2022  Prepared by: [name]    Exercises  - Seated Cervical Sidebending Stretch  - 1 x daily - 7 x weekly - 1 sets - 10 reps - 5 hold  - Seated Levator Scapulae Stretch  - 1 x daily - 7 x weekly - 1 sets - 10 reps - 5 hold  - Doorway Pec Stretch at 120 Degrees Abduction  - 1 x daily - 7 x weekly - 1 sets - 10 reps - 5 hold  - Seated Thoracic Lumbar Extension with Pectoralis Stretch  - 1 x daily - 7 x weekly - 1 sets - 10 reps - 5 hold  - Seated Scapular Retraction  - 1 x daily - 7 x weekly - 2 sets - 10 reps - 5 hold  - Supine Chin Tuck  - 1 x daily - 7 x weekly - 2 sets - 10 reps - 5 hold  - Prone Shoulder Row  - 1 x daily - 3 x weekly - 2 sets - 10 reps - 5 hold  - Prone Scapular Slide with Shoulder Extension  - 1 x daily - 3 x weekly - 2 sets - 10 reps - 5 hold  - Prone Scapular Retraction Arms at Side  - 1 x daily - 3 x weekly - 2 sets - 10 reps - 5 hold  - Prone Scapular Retraction Y  - 1 x daily - 3 x weekly - 1 sets - 10 reps - 5 hold  - Supine Scapular Protraction in Flexion with Dumbbells  - 1 x  daily - 3 x weekly - 2 sets - 10 reps - 5 hold  - Sidelying Shoulder ER with Towel and Dumbbell  - 1 x daily - 3 x weekly - 2 sets - 10 reps - 5 hold  - Sidelying Shoulder Internal Rotation  with Dumbbell  - 1 x daily - 3 x weekly - 2 sets - 10 reps - 5 hold  - Standing Shoulder Flexion to 90 Degrees with Dumbbells  - 1 x daily - 3 x weekly - 3 sets - 10 reps - 5 hold  - Shoulder Abduction with Dumbbells - Thumbs Up  - 1 x daily - 3 x weekly - 2 sets - 10 reps - 5 hold

## 2022-06-01 NOTE — Progress Notes (Signed)
Lawson Radar, [facility]  [address], [place] [address]            Christopher Gonzalez is a 81 y.o. male seen at the request of Dr. Tera Helper for the evaluation of low testosterone levels.      ASSESSMENT AND PLAN:    1. 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  I do not believe this patient is an appropriate candidate for testosterone replacement given his history of prostate cancer as well as the elevation in his PSA levels as recently as 1 year ago.  I suspect most of his symptoms are related to dutasteride therapy.  There is no evidence in his lab work that this is secondary hypogonadism and thus I am not concerned about any pituitary dysfunction.  Even if I believe this patient was an appropriate candidate, I would not be willing to start testosterone replacement without discussing with his uro-oncologist.  I will forward my note today to Dr. Christophe Louis as well as back to patient's primary care provider.  Encouraged patient to discuss his lab work and his symptoms with Dr. Christophe Louis at his next appointment.  Patient was satisfied with our decision not to initiate any testosterone therapy.    Return if symptoms worsen or fail to improve.     History of Present Illness:    Presentation/diagnosis: At patient's last primary care visit, he reported tender nipples which prompted his PCP to check pituitary hormones.  His free testosterone levels were decreased and patient was referred to endocrinology.    Symptoms:  He reports a decreased libido, erectile dysfunction, fatigue particularly over the last 6 months.  He reports decreased muscle strength over the last 6-12 months.    Denies new or increased depression, mood changes.  Denies change in shaving frequency, loss of body hair.  Denies unusual headaches, tunnel vision, diplopia.  Reports breast  swelling/tenderness, decreased testicular size.    Risk Factors:   Denies head trauma or testicular trauma.  He has fathered children.  He went through puberty normally.     Previous treatment: None    Side effects of treatment: None    Associated conditions: History of prostate cancer, on dutasteride and tamsulosin.  Patient has not had a prostatectomy.    Labs:    PSA    03/15/2021: 14.3   09/03/2021: 2.8   03/21/2022: 2.5    Hematocrit    04/21/2022: 44.3    Testosterone    05/14/2018: total: 428   03/31/2022: total: 597, free: 2.1 (drawn at 1510)   04/21/2022: total: 622, free: 3.7 (drawn at 0834)    FSH/LH    03/31/2022: LH: 17.6   04/21/2022: FSH: 34.8     Prolactin    04/21/2022: 10.2    AM Cortisol:    04/21/2022: 11.4    Thyroid:    03/31/2022: TSH: 1.430  04/21/2022: Free T4: 0.9    Hgb A1c:    01/26/2021: 5.5%    Imaging:  None found        Review of Systems   Constitutional:  Positive for fatigue.   Eyes:  Negative for visual disturbance.   Genitourinary:         Positive for low sex drive, difficulty with erections and nipple tenderness   Musculoskeletal:         Positive for decreased muscle strength  Neurological:  Negative for headaches.   Psychiatric/Behavioral:  Negative for dysphoric mood.      BP (!) 155/72 (Site: Left Upper Arm, Position: Sitting)   Pulse 60   Wt 178 lb 9.6 oz (81 kg)   SpO2 98%   BMI 29.72 kg/m     Wt Readings from Last 3 Encounters:   03/31/22 178 lb (80.7 kg)   03/21/22 177 lb 14.4 oz (80.7 kg)   01/03/22 181 lb (82.1 kg)     Body weight trend: fluctuating a bit    Physical Exam  Vitals reviewed.   Constitutional:       Appearance: Normal appearance.   Neck:      Thyroid: No thyroid mass, thyromegaly or thyroid tenderness.   Cardiovascular:      Rate and Rhythm: Normal rate and regular rhythm.      Heart sounds: Normal heart sounds.   Pulmonary:      Breath sounds: Normal breath sounds.   Lymphadenopathy:      Cervical: No cervical adenopathy.   Skin:     General: Skin  is warm and dry.   Neurological:      General: No focal deficit present.      Mental Status: He is alert and oriented to person, place, and time.           Lawson Radar, APRN-CNP        Portions of this note were generated with the assistance of voice recognition software.  As such, some errors in transcription may be present.

## 2022-06-07 ENCOUNTER — Encounter: Payer: MEDICARE | Primary: Geriatric Medicine

## 2022-06-07 NOTE — Progress Notes (Signed)
Christopher Gonzalez  DOB: Jul 27, 1941  Primary: Medicare Part A And B  Secondary: [place] [facility]  [address]  [place] [address]  Phone: [phone]  Fax: [phone]    PT Visit Info:    Total # of Visits to Date: 4  Canceled Appointment: 2     OT Visit Info:  No data recorded    OUTPATIENT THERAPY: 06/07/2022  Episode  Appt Desk        Christopher Gonzalez cancelled his appointment for today due to unknown reasons.  Will plan to follow up next during next appointment.  Thank you,  Christopher Gonzalez, PT    Future Appointments   Date Time Provider [place]   06/07/2022  1:45 PM Christopher Gonzalez, PT [facility]   06/09/2022  7:30 AM Christopher Gonzalez, PT [facility]   06/15/2022  8:20 AM Sharlyn Bologna, MD [facility] AMB   09/23/2022 10:00 AM [facility]   09/23/2022 10:45 AM Amado Coe, MD [facility] AMB

## 2022-06-09 ENCOUNTER — Inpatient Hospital Stay: Admit: 2022-06-09 | Payer: MEDICARE | Primary: Geriatric Medicine

## 2022-06-09 NOTE — Progress Notes (Signed)
Christopher Gonzalez  DOB: 04-27-1941  Primary: Medicare Part A And B (Medicare)  Secondary: [facility]  [address]  [address]  [place] [address]  Phone: [phone]  Fax: [phone] Plan Frequency: 2x/wk for 5 weeks  Plan of Care/Certification Expiration Date: 06/20/22      PT Visit Info:  Plan Frequency: 2x/wk for 5 weeks  Plan of Care/Certification Expiration Date: 06/20/22  Total # of Visits to Date: 5  Canceled Appointment: 2      Visit Count:  5                OUTPATIENT PHYSICAL THERAPY:             OP NOTE TYPE: Progress Report 06/09/2022               Episode (L shoulder pain w/ radicular symptoms) Appt Desk         Treatment Diagnosis:  Pain in Left Shoulder (M25.512)  Stiffness of Left Shoulder, Not elsewhere classified (M25.612)  Cervicalgia (M54.2)  Medical/Referring Diagnosis:  Acute pain of left shoulder [M25.512]  Referring Physician:  Sharlyn Bologna, MD  MD Orders:  PT Eval and Treat   Return MD Appt:  2 months  Date of Onset:  Onset Date:  (2 months ago)      Allergies:  Codeine  Restrictions/Precautions:    Restrictions/Precautions: None        Medications Last Reviewed:  06/09/2022     SUBJECTIVE   History of Injury/Illness (Reason for Referral):  Pt unsure how pain began. Noticed it in L shoulder area then started getting nerve pain down his L arm to his middle finger. 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 No imaging or injury that he can remember. Notes his arm feels weak at times too.   Patient Stated Goal(s):  "no pain in arm"  Initial:     0/10 Post Session:     0/10  Past Medical History/Comorbidities:   Mr. Mcphee  has a past medical history of Elevated PSA, Erectile dysfunction, Hypercholesterolemia, Personal history of prostate cancer, and Prostate cancer ([facility]).  Mr. Favia  has a past surgical history that includes Cholecystectomy; malignant skin lesion excision; Appendectomy; and hernia repair.      OBJECTIVE       Date:  04/21/22 Date:  06/09/22 Date:     Shoulder Flex/Scapt R: 5/5, 160*  L: 4+/5, 160* R: 5/5  L: 5/5    Shoulder ABD R: 5/5, 180*  L: 4+/5, 180* R: 5/5  L: 5/5    Shoulder ER R: 5/5, 70*  L: 5/5, 70* R: 5/5  L: 5/5    Shoulder IR R: 5/5, 80*  L: 4+/5 some pain, 70* R: 5/5  L: 5/5, 85*     Elbow Flex R: 5/5  L: 5/5     Elbow Ext R: 5/5  L: 5/5     Cervical rotation R: 50*  L: 35* R: 70*  L: 70*    Cervical side bending R: 30*  L: 35* R: 40*  L: 40*    Cervical Flex 40*     Cervical Ext 40*       Sensation:   Intact w/ light touch to BUEs    Test/Function:  Spurling's: no pain w/ compression, traction felt good  Neer: positive on L  Hawkins Kennedy: positive on L  ER: no pain  Empty Can Test: no pain  ASSESSMENT   Initial Assessment:  Mr. Vanover presents to PT eval w/ c/o L shoulder pain and weakness w/ radicular symptoms down his arm. Currently, he displays decreased posture, cervical ROM, L shoulder ROM, and L shoulder strength. With palpation, he was tight and tender to his occipitals, upper trap, and levator on the L. Mr. Choinski will benefit from continued skilled PT services to improve deficits listed below and to progress towards his PLOF.   Updated Assessment: Mr. Segreto presented to PT eval w/ c/o L shoulder pain and weakness w/ radicular symptoms down his arm. At this time, his radicular symptoms have resolved and he has not had pain lately. He has met 6/6 PT goals including shoulder and cervical ROM as well as shoulder strength. Pt wants to leave POC open in case he needs to return at this time. New HEP issued this session. Thank you for this referral.      Problem List: (Impacting functional limitations):    Body Structures, Functions, Activity Limitations Requiring Skilled Therapeutic Intervention: Decreased ADL status; Decreased ROM; Decreased balance; Decreased endurance; Decreased strength; Decreased posture; Increased pain     Therapy Prognosis:   Therapy Prognosis: Good     Initial Assessment  Complexity:   Decision Making: Low Complexity    PLAN   Effective Dates: 04/21/22 TO Plan of Care/Certification Expiration Date: 06/20/22     Frequency/Duration: Plan Frequency: 2x/wk for 5 weeks     Interventions Planned (Treatment may consist of any combination of the following):    Current Treatment Recommendations: Strengthening; ROM; Manual; Neuromuscular re-education; Endurance training; Home exercise program; Modalities; Dry needling     Goals: (Goals have been discussed and agreed upon with patient.)  Short Term Goals: 4 weeks  1. Pt will be independent w/ HEP in order to improve outcomes and decrease pain levels. MET  2. Pt will have L shoulder IR ROM of 80* or more in order to improve functional mobility. MET  3. Pt will have L shoulder strength of 5/5 or greater in all major motions in order to perform overhead tasks. MET    Long Term Goals: 5 weeks  1. Pt will have QUICKDASH score of 20 or less in order to display decreased pain and decreased functional impairments. MET, 12 as of 06/09/22  2. Pt will have cervical rotation ROM of 50* or more bilaterally in order to improve his safety w/ driving. MET  3. Pt will have cervical side bending motion of 35* or more in order to improve his safety w/ driving. MET           Outcome Measure:   Tool Used: Disabilities of the Arm, Shoulder and Hand (DASH) Questionnaire - Quick Version  Score:  Initial: 24/55  Most Recent: 12/55 (Date: 06/09/22)   Interpretation of Score: The DASH is designed to measure the activities of daily living in person's with upper extremity dysfunction or pain.  Each section is scored on a 1-5 scale, 5 representing the greatest disability.  The scores of each section are added together for a total score of 55.      Tool Used: Neck Disability Index (NDI)  Score:  Initial: 2/50  Most Recent: 1/50 (Date: 06/09/22 )   Interpretation of Score: The Neck Disability Index is a revised form of the Oswestry Low Back Pain Index and is designed to measure the  activities of daily living in person's with neck pain.  Each section is scored on a 0-5 scale, 5 representing the  greatest disability.  The scores of each section are added together for a total score of 50.     Medical Necessity:   > Patient is expected to demonstrate progress in strength, range of motion, balance, and coordination to increase independence with functional tasks.  Reason For Services/Other Comments:  > Patient continues to demonstrate capacity to improve strength, ROM, balance, mobility which will increase independence.  Total Duration:  Time In: 0730  Time Out: 0815    Regarding [facility] therapy, I certify that the treatment plan above will be carried out by a therapist or under their direction.  Thank you for this referral,  [name], PT     Referring Physician Signature: Sharlyn Bologna, MD No Signature is Required for this note.        Post Session Pain  Charge Capture  PT Visit Info MD Guidelines  MyChart  Access Code: [email or web address]  URL: [email or web address]  Date: 06/09/2022  Prepared by: [name]    Exercises  - Seated Cervical Sidebending Stretch  - 1 x daily - 7 x weekly - 1 sets - 10 reps - 5 hold  - Seated Levator Scapulae Stretch  - 1 x daily - 7 x weekly - 1 sets - 10 reps - 5 hold  - Doorway Pec Stretch at 120 Degrees Abduction  - 1 x daily - 7 x weekly - 1 sets - 10 reps - 5 hold  - Seated Thoracic Lumbar Extension with Pectoralis Stretch  - 1 x daily - 7 x weekly - 1 sets - 10 reps - 5 hold  - Seated Scapular Retraction  - 1 x daily - 7 x weekly - 2 sets - 10 reps - 5 hold  - Supine Chin Tuck  - 1 x daily - 7 x weekly - 2 sets - 10 reps - 5 hold  - Prone Shoulder Row  - 1 x daily - 3 x weekly - 2 sets - 10 reps - 5 hold  - Prone Scapular Slide with Shoulder Extension  - 1 x daily - 3 x weekly - 2 sets - 10 reps - 5 hold  - Prone Scapular Retraction Arms at Side  - 1 x daily - 3 x weekly - 2 sets - 10 reps - 5 hold  - Prone Scapular Retraction  Y  - 1 x daily - 3 x weekly - 1 sets - 10 reps - 5 hold  - Supine Scapular Protraction in Flexion with Dumbbells  - 1 x daily - 3 x weekly - 2 sets - 10 reps - 5 hold  - Sidelying Shoulder ER with Towel and Dumbbell  - 1 x daily - 3 x weekly - 2 sets - 10 reps - 5 hold  - Sidelying Shoulder Internal Rotation with Dumbbell  - 1 x daily - 3 x weekly - 2 sets - 10 reps - 5 hold  - Standing Shoulder Flexion to 90 Degrees with Dumbbells  - 1 x daily - 3 x weekly - 3 sets - 10 reps - 5 hold  - Shoulder Abduction with Dumbbells - Thumbs Up  - 1 x daily - 3 x weekly - 2 sets - 10 reps - 5 hold  - Sidelying Shoulder Abduction Full Range of Motion with Dumbbell  - 1 x daily - 3 x weekly - 2 sets - 10 reps - 5 hold  - Sidelying Shoulder Horizontal Abduction  - 1 x daily - 3 x weekly -  2 sets - 10 reps - 5 hold  - Sidelying Shoulder Flexion  - 1 x daily - 3 x weekly - 2 sets - 10 reps - 5 hold  - Shoulder Flexion Serratus Activation with Resistance  - 1 x daily - 3 x weekly - 2 sets - 10 reps - 5 hold  - Standing Shoulder Diagonal Horizontal Abduction 60/120 Degrees with Resistance  - 1 x daily - 3 x weekly - 2 sets - 10 reps - 5 hold

## 2022-06-09 NOTE — Progress Notes (Signed)
Christopher Gonzalez  DOB: 1941-03-17  Primary: Medicare Part A And B (Medicare)  Secondary: [facility]  [address]  [address]  [place] [address]  Phone: [phone]  Fax: [phone] Plan Frequency: 2x/wk for 5 weeks  Plan of Care/Certification Expiration Date: 06/20/22      >PT Visit Info:  Plan Frequency: 2x/wk for 5 weeks  Plan of Care/Certification Expiration Date: 06/20/22  Total # of Visits to Date: 68  Canceled Appointment: 2      Visit Count:  5    OUTPATIENT PHYSICAL THERAPY:OP NOTE TYPE: OP Note Type: Treatment Note 06/09/2022       Episode  }Appt Desk             Treatment Diagnosis:  Pain in Left Shoulder (M25.512)  Stiffness of Left Shoulder, Not elsewhere classified (M25.612)  Cervicalgia (M54.2)  Medical/Referring Diagnosis:  Acute pain of left shoulder [M25.512]  Referring Physician:  Sharlyn Bologna, MD  MD Orders:  PT Eval and Treat   Date of Onset:  Onset Date:  (2 months ago)     Allergies:   Codeine  Restrictions/Precautions:  Restrictions/Precautions: None     Interventions Planned (Treatment may consist of any combination of the following):    Current Treatment Recommendations: Strengthening; ROM; Manual; Neuromuscular re-education; Endurance training; Home exercise program; Modalities; Dry needling     >Subjective Comments:  No pain. Feeling good.  >Initial:     0/10>Post Session:       0/10  Medications Last Reviewed:  06/09/2022  Updated Objective Findings:  None Today  Treatment   THERAPEUTIC EXERCISE: (30 minutes):    Exercises per grid below to improve mobility, strength, balance, and coordination.  Required minimal verbal and tactile cues to promote proper body alignment, promote proper body posture, and promote proper body mechanics.  Progressed resistance, range, repetitions, and complexity of movement as indicated.  MANUAL THERAPY: (10 minutes):   Joint mobilization and Soft tissue mobilization was utilized and necessary because of the  patient's restricted joint motion, painful spasm, loss of articular motion, and restricted motion of soft tissue.   Date:  05/09/22 Date:  05/30/22 Date:  06/01/22 Date:  06/09/22   Parameters Parameters Parameters Parameters   PROM to L shoulder in supine Mobilizations to L shoulder in supine Mobilization to L shoulder in supine Mobilizations to L shoulder in supine   Mobilizations to L shoulder in supine Cervical traction  PROM to L shoulder in supine PROM to L shoulder in supine    STM to L upper trap and pec in supine       MODALITIES: (0 mins)       *  Hot Pack Therapy in order to provide analgesia and relieve muscle spasm.    Date:  04/21/22 Date:  05/09/22 Date:  05/30/22 Date:  06/01/22 Date:  06/09/22   Activity/Exercise Parameters Parameters Parameters Parameters Parameters   UBE  4/4 2.0 4/4 2.0  4/4 2.0  4/4 2.5   Chin tuck 5x seated  10x supine       Upper trap stretch 3x L       Levator stretch 3x L       Pec stretch 3x in door       Thoracic extension 3x in chair       Scapular retraction 10x seated w/ cues       Shoulder flexion  2x10 B seated 3# 2x10 B standing 3# 2x10 L  sidelying 2# 2x10 B sidelying 2#   Shoulder abduction  2x10 B seated 3# 2x10 B standing 3# 2x10 L sidelying 2#   2x10 horizontal sidelying 2# 2x10 B sidelying 2#   2x10 B horizontal sidelying 2#   Rows  2x10 B prone 2x10 prone  2x10 prone 2x10 prone   Ts  2x10 B prone 2x10 prone 2x10 prone 2x10 prone   Ms  2x10 B prone 2x10 prone 2x10 prone 2x10 prone   Ys  10x B prone 10x B prone     Serratus press  2x10 B 3# supine 2x10 3# supine 2x10 lime band supine 2x10 lime band   ER  2x10 B 3# sidelying 2x10 B 3# sidelying 20x lime band supine B 20x lime band supine B   IR  2x10 B 3# sidelying 2x10 B 3# sidelying     Diagonal    20x lime band up and out  20x lime band down and across 20x lime band up and out   20x lime band down and across       Treatment/Session Summary:    >Treatment Assessment:  Pt had no pain post session. See progress report for  updated measures. He completed 5 of his 10 visits and is doing very well. Taking a break from PT to see if he can maintain low pain levels on his own with HEP.  Communication/Consultation:  None today  Equipment provided today:  HEP  Recommendations/Intent for next treatment session: Next visit will focus on progression of functional tasks as tolerated.    >Total Treatment Billable Duration: 30 minutes therex, 10 mins manual   Time In: 0730  Time Out: 0815    [name], PT       Charge Capture  }Post Session Pain  PT Visit Info  Med[facility] Portal  MD Guidelines  Scanned Media  Benefits  MyChart    Future Appointments   Date Time Provider [place]   06/15/2022  8:20 AM Sharlyn Bologna, MD [facility]   09/23/2022 10:00 AM PERIPHERAL [facility]   09/23/2022 10:45 AM Amado Coe, MD [facility]     Access Code: [email or web address]  URL: [email or web address]  Date: 06/09/2022  Prepared by: [name]    Exercises  - Seated Cervical Sidebending Stretch  - 1 x daily - 7 x weekly - 1 sets - 10 reps - 5 hold  - Seated Levator Scapulae Stretch  - 1 x daily - 7 x weekly - 1 sets - 10 reps - 5 hold  - Doorway Pec Stretch at 120 Degrees Abduction  - 1 x daily - 7 x weekly - 1 sets - 10 reps - 5 hold  - Seated Thoracic Lumbar Extension with Pectoralis Stretch  - 1 x daily - 7 x weekly - 1 sets - 10 reps - 5 hold  - Seated Scapular Retraction  - 1 x daily - 7 x weekly - 2 sets - 10 reps - 5 hold  - Supine Chin Tuck  - 1 x daily - 7 x weekly - 2 sets - 10 reps - 5 hold  - Prone Shoulder Row  - 1 x daily - 3 x weekly - 2 sets - 10 reps - 5 hold  - Prone Scapular Slide with Shoulder Extension  - 1 x daily - 3 x weekly - 2 sets - 10 reps - 5 hold  - Prone Scapular Retraction Arms at Side  - 1 x daily -  3 x weekly - 2 sets - 10 reps - 5 hold  - Prone Scapular Retraction Y  - 1 x daily - 3 x weekly - 1 sets - 10 reps - 5 hold  - Supine Scapular Protraction in Flexion with Dumbbells  - 1 x daily -  3 x weekly - 2 sets - 10 reps - 5 hold  - Sidelying Shoulder ER with Towel and Dumbbell  - 1 x daily - 3 x weekly - 2 sets - 10 reps - 5 hold  - Sidelying Shoulder Internal Rotation with Dumbbell  - 1 x daily - 3 x weekly - 2 sets - 10 reps - 5 hold  - Standing Shoulder Flexion to 90 Degrees with Dumbbells  - 1 x daily - 3 x weekly - 3 sets - 10 reps - 5 hold  - Shoulder Abduction with Dumbbells - Thumbs Up  - 1 x daily - 3 x weekly - 2 sets - 10 reps - 5 hold  - Sidelying Shoulder Abduction Full Range of Motion with Dumbbell  - 1 x daily - 3 x weekly - 2 sets - 10 reps - 5 hold  - Sidelying Shoulder Horizontal Abduction  - 1 x daily - 3 x weekly - 2 sets - 10 reps - 5 hold  - Sidelying Shoulder Flexion  - 1 x daily - 3 x weekly - 2 sets - 10 reps - 5 hold  - Shoulder Flexion Serratus Activation with Resistance  - 1 x daily - 3 x weekly - 2 sets - 10 reps - 5 hold  - Standing Shoulder Diagonal Horizontal Abduction 60/120 Degrees with Resistance  - 1 x daily - 3 x weekly - 2 sets - 10 reps - 5 hold

## 2022-06-15 ENCOUNTER — Encounter: Payer: MEDICARE | Attending: Geriatric Medicine | Primary: Geriatric Medicine

## 2022-06-24 ENCOUNTER — Encounter: Payer: MEDICARE | Attending: Geriatric Medicine | Primary: Geriatric Medicine

## 2022-07-28 ENCOUNTER — Ambulatory Visit
Admit: 2022-07-28 | Discharge: 2022-07-28 | Payer: MEDICARE | Attending: Geriatric Medicine | Primary: Geriatric Medicine

## 2022-07-28 DIAGNOSIS — N644 Mastodynia: Secondary | ICD-10-CM

## 2022-07-28 NOTE — Progress Notes (Signed)
SUBJECTIVE:   Christopher Gonzalez is a 81 y.o. 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       Current Outpatient Medications   Medication Sig Dispense Refill    dutasteride (AVODART) 0.5 MG capsule TAKE 1 CAPSULE EVERY DAY 90 capsule 3    tamsulosin (FLOMAX) 0.4 MG capsule TAKE 2 CAPSULES BY MOUTH DAILY. 180 capsule 1    atorvastatin (LIPITOR) 20 MG tablet Take 1 tablet by mouth daily 90 tablet 3    tadalafil (CIALIS) 20 MG tablet TAKE 1 TABLET BY MOUTH AS NEEDED FOR ERECTILE DYSFUNCTION. 18 tablet 11    BIOTIN PO Take 5,000 mcg by mouth daily      aspirin 81 MG EC tablet Take 1 tablet by mouth daily      cyanocobalamin 100 MCG tablet Take 1 tablet by mouth daily      meclizine (ANTIVERT) 12.5 MG tablet Take by mouth as needed PRN       No current facility-administered medications for this visit.     Allergies   Allergen Reactions    Codeine Nausea And Vomiting     Patient Active Problem List   Diagnosis    Agatston coronary artery calcium score greater than 400    Hyperlipidemia    Hypertension    Impaired fasting glucose     Past Medical History:   Diagnosis Date     Elevated PSA     Erectile dysfunction     Hypercholesterolemia     Personal history of prostate cancer     Prostate cancer [facility])      Past Surgical History:   Procedure Laterality Date    APPENDECTOMY      1950    CHOLECYSTECTOMY      HERNIA REPAIR      MALIGNANT SKIN LESION EXCISION      basal cell - right cheek     Family History   Problem Relation Age of Onset    No Known Problems Brother     No Known Problems Brother     Stroke Father     Dementia Mother      Social History     Tobacco Use    Smoking status: Former     Packs/day: 0.25     Years: 15.00     Additional pack years: 0.00     Total pack years: 3.75     Types: Cigarettes     Quit date: 1989     Years since quitting: 34.8    Smokeless tobacco: Never  Substance Use Topics    Alcohol use: Yes     Alcohol/week: 2.0 standard drinks of alcohol         Review of Systems   Constitutional:  Negative for fever.   Respiratory:  Negative for cough and shortness of breath.    Cardiovascular:  Negative for chest pain and leg swelling.   Gastrointestinal:  Negative for abdominal pain.   Psychiatric/Behavioral:  Negative for behavioral problems and confusion.          OBJECTIVE:  BP 138/72 (Site: Left Upper Arm, Position: Sitting, Cuff Size: Small Adult)   Pulse 67   Wt 81.1 kg (178 lb 12.8 oz)   SpO2 98%   BMI 29.75 kg/m      Physical Exam  Vitals and nursing note reviewed.   Constitutional:       General: He is not in acute distress.  Cardiovascular:      Rate and Rhythm: Normal rate and regular rhythm.   Pulmonary:      Effort: Pulmonary effort is normal.      Breath sounds: No wheezing.   Neurological:      General: No focal deficit present.      Mental Status: He is oriented to person, place, and time.   Psychiatric:         Mood and Affect: Mood normal.         Behavior: Behavior normal.         Medical problems and test results were reviewed with the patient today.     No results found for this or any previous visit (from the past 672  hour(s)).      ASSESSMENT and PLAN    Christopher Gonzalez was seen today for hypertension and hyperlipidemia.    Diagnoses and all orders for this visit:    Breast pain, left  -     US BREAST COMPLETE LEFT; Future    Other hyperlipidemia  -     Comprehensive Metabolic Panel; Future      No follow-ups on file.

## 2022-08-31 ENCOUNTER — Encounter

## 2022-09-05 ENCOUNTER — Inpatient Hospital Stay: Payer: MEDICARE | Attending: Geriatric Medicine | Primary: Geriatric Medicine

## 2022-09-19 ENCOUNTER — Ambulatory Visit: Payer: MEDICARE | Primary: Geriatric Medicine

## 2022-09-19 ENCOUNTER — Inpatient Hospital Stay: Admit: 2022-09-19 | Payer: MEDICARE | Attending: Geriatric Medicine | Primary: Geriatric Medicine

## 2022-09-19 DIAGNOSIS — N644 Mastodynia: Secondary | ICD-10-CM

## 2022-09-20 ENCOUNTER — Encounter

## 2022-09-23 ENCOUNTER — Encounter: Admit: 2022-09-23 | Discharge: 2022-09-23 | Payer: MEDICARE | Attending: Urology | Primary: Geriatric Medicine

## 2022-09-23 ENCOUNTER — Encounter

## 2022-09-23 ENCOUNTER — Inpatient Hospital Stay: Admit: 2022-09-23 | Payer: MEDICARE | Primary: Geriatric Medicine

## 2022-09-23 DIAGNOSIS — C61 Malignant neoplasm of prostate: Secondary | ICD-10-CM

## 2022-09-23 LAB — PSA, DIAGNOSTIC: PSA: 3.3 ng/mL (ref <4.0)

## 2022-09-23 MED ORDER — DUTASTERIDE 0.5 MG PO CAPS
0.5 MG | ORAL_CAPSULE | Freq: Every day | ORAL | 3 refills | Status: AC
Start: 2022-09-23 — End: 2023-11-06

## 2022-09-23 MED ORDER — TAMSULOSIN HCL 0.4 MG PO CAPS
0.4 MG | ORAL_CAPSULE | Freq: Every day | ORAL | 3 refills | Status: AC
Start: 2022-09-23 — End: 2023-11-06

## 2022-09-23 MED ORDER — TADALAFIL 20 MG PO TABS
20 MG | ORAL_TABLET | Freq: Every day | ORAL | 11 refills | Status: AC | PRN
Start: 2022-09-23 — End: ?

## 2022-09-23 NOTE — Progress Notes (Signed)
[facility]  [address] [place] [address]  [phone]        [name] is a 81 y.o. 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 On Dutasteride and active surveillance.    INTERVAL HISTORY:    Christopher Gonzalez returns today for follow-up evaluation of his prostate cancer and BPH.  He continues on dutasteride with control of his LUTS symptoms.  He states that he voids with appropriate stream and empties his bladder entirely.  He gets up once at night approximately 2-3 times per week.  He denies any dysuria or hematuria.  He request refills of his Flomax, dutasteride and Cialis at today's visit.    From previous note (03/21/22):  Patient is here today for second follow-up.  He has a history of prostate cancer BPH.  He was diagnosed with Gleason 3+3 equal 6 adenocarcinoma prostate by Dr. Dutch Gray and has been on active surveillance for quite some time.  He was also started on dutasteride.  His last PSA on 09/03/2021 was 2.8.     His previous prostate MRI on 06/15/2019 showed no discernible lesions.  He is also had negative repeat biopsies in the hospital.  His prostate previously measured 102 cc.     His only complaint today is that he has gained some weight since starting dutasteride.     He has no lower urinary tract symptom complaints.  He denies any hematuria or dysuria.     His PSA today is stable at 2.5.    Past medical, family and social histories, as well as medications and allergies, were reviewed and updated in the medical record as appropriate.    PMH:     Past Medical History:   Diagnosis Date    Elevated PSA     Erectile dysfunction     Hypercholesterolemia     Personal history of prostate cancer     Prostate cancer (HCC)        MEDs:     aspirin  atorvastatin  BIOTIN PO  cyanocobalamin  dutasteride  meclizine  tadalafil  tamsulosin     ALLERGIES:    Allergies   Allergen Reactions     Codeine Nausea And Vomiting       ROS:     Review of Systems   Genitourinary:  Negative for difficulty urinating, dysuria, frequency, hematuria and urgency.   All other systems reviewed and are negative.       PHYSICAL EXAMINATION    BP (!) 164/72   Pulse 64   Temp 98 F (36.7 C) (Oral)   Resp 16   Ht 1.651 m ('5\' 5"'$ )   Wt 78.8 kg (173 lb 12.8 oz)   SpO2 99%   BMI 28.92 kg/m   General: well dressed, well nourished, no acute distress  Skin: no rashes  HEENT: Sclera are clear, normocephalic, atraumatic. no external lesions   Cardiovascular: Reg. Normal perfusion  Respiratory: normal respiratory effort, no JVD, no audible wheezing.  Musculoskeletal: unremarkable with normal function. No embolic signs or cyanosis.   Neurologic exam: intact, no focal deficits, moves all 4 extremities  Psych: normal mood and affect, alert, oriented x 3  LE:  no edema  GI/GU: DEFERRED       LABORATORY RESULTS:    Lab Results   Component Value Date/Time    PSA 3.3 09/23/2022 10:09 AM  PSA 2.5 03/21/2022 09:52 AM    PSA 2.8 09/03/2021 09:24 AM          IMAGING:    ASSESSMENT:    [name] is a 81 y.o. 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  His PSA today has bumped slightly to 3.3 (approximately 6.6 accounting for dutasteride) from 2.5 (5) on 03/21/2022.  His most recent MRI was 06/15/2019 and negative for discernible lesions at that time.  His prostate measured 102 cc.  We discussed repeating his MRI following today's visit returning in a couple of weeks to review results versus having him return in 3 to 4 months with repeat PSA and MRI prior to that visit.  He would like to wait and have his MRI completed in 3 to 4 months.  He will return to clinic with repeat PSA following his imaging.    I have provided him refills for his Flomax, dutasteride and Cialis.  These were sent to his pharmacy.      PLAN:     - MRI pelvis with and without contrast in 4 months  - RTC with PSA following MRI  - Refills for Tamsulosin,  Dutasteride and Cialis sent to patient's pharmacy  ________________________________________            I have seen and examined this patient.  I have reviewed and edited the note started by the MA and agree with the outlined plan.  Part of this note was written by using a voice dictation software. The note has been proof read but may still contain some grammatical/other typographical errors.      Marlyne Beards, PA-C  Urologic Oncology  [facility]      I have seen and examined this patient.  I have reviewed the note and agree with the outlined plan.  Repeat PSA today is stable at 3.3.  He will see [place] back in 4 to 6 months with repeat PSA and an MRI.  His last MRI was 06/15/2019.  Pending those findings we will discuss whether or not he wants to move forward with a repeat biopsy.      Merwyn Katos, MD,MPH, [facility]

## 2022-09-23 NOTE — Patient Instructions (Signed)
Your PSA today is 3.3, up from 2.5 on 03/21/22. We discussed proceeding with MRI now with return to clinic following to review results vs MRI with repeat PSA in 3-4 months. We will plan to have you return in 3-4 months with repeat PSA and MRI prior.    A refill for Cialis and Flomax has been sent to your pharmacy.

## 2022-10-23 ENCOUNTER — Encounter

## 2022-10-24 MED ORDER — ATORVASTATIN CALCIUM 20 MG PO TABS
20 MG | ORAL_TABLET | Freq: Every day | ORAL | 3 refills | Status: DC
Start: 2022-10-24 — End: 2023-08-14

## 2022-10-24 NOTE — Telephone Encounter (Signed)
Requested Prescriptions     Pending Prescriptions Disp Refills    atorvastatin (LIPITOR) 20 MG tablet [Pharmacy Med Name: ATORVASTATIN CALCIUM 20 MG Tablet] 90 tablet 3     Sig: TAKE 1 TABLET EVERY DAY

## 2022-11-17 ENCOUNTER — Encounter
Admit: 2022-11-17 | Discharge: 2022-11-17 | Payer: MEDICARE | Attending: Cardiovascular Disease | Primary: Geriatric Medicine

## 2022-11-17 DIAGNOSIS — E785 Hyperlipidemia, unspecified: Secondary | ICD-10-CM

## 2022-11-17 NOTE — Progress Notes (Signed)
[facility]  [address], [id number]  [place] [address]  PHONE: [phone]        11/17/22        NAME:  Christopher Gonzalez  DOB: 1941-08-04  MRN: [id number]     CHIEF COMPLAINT:    Coronary Artery Disease      SUBJECTIVE:     No chest pain or palpitation or dizziness. Good year.     Medications were all reviewed with the patient today and updated as necessary.   Current Outpatient Medications   Medication Sig    atorvastatin (LIPITOR) 20 MG tablet TAKE 1 TABLET EVERY DAY    dutasteride (AVODART) 0.5 MG capsule Take 1 capsule by mouth daily    tadalafil (CIALIS) 20 MG tablet Take 1 tablet by mouth daily as needed for Erectile Dysfunction    dutasteride (AVODART) 0.5 MG capsule TAKE 1 CAPSULE EVERY DAY    tamsulosin (FLOMAX) 0.4 MG capsule TAKE 2 CAPSULES BY MOUTH DAILY.    tadalafil (CIALIS) 20 MG tablet TAKE 1 TABLET BY MOUTH AS NEEDED FOR ERECTILE DYSFUNCTION.    BIOTIN PO Take 5,000 mcg by mouth daily    cyanocobalamin 100 MCG tablet Take 1 tablet by mouth daily    meclizine (ANTIVERT) 12.5 MG tablet Take by mouth as needed PRN    tamsulosin (FLOMAX) 0.4 MG capsule Take 2 capsules by mouth daily    aspirin 81 MG EC tablet Take 1 tablet by mouth daily     No current facility-administered medications for this visit.        Allergies   Allergen Reactions    Codeine Nausea And Vomiting           PHYSICAL EXAM:     Wt Readings from Last 3 Encounters:   11/17/22 77.6 kg (171 lb)   09/23/22 78.8 kg (173 lb 12.8 oz)   07/28/22 81.1 kg (178 lb 12.8 oz)     BP Readings from Last 3 Encounters:   11/17/22 138/70   09/23/22 (!) 164/72   07/28/22 138/72       BP 138/70   Pulse 72   Ht 1.651 m (5' 5"$ )   Wt 77.6 kg (171 lb)   BMI 28.46 kg/m     Physical Exam  Vitals reviewed.   HENT:      Head: Normocephalic and atraumatic.   Eyes:      Extraocular Movements: Extraocular movements intact.      Pupils: Pupils are equal, round, and reactive to light.   Cardiovascular:      Rate and Rhythm: Normal  rate.      Heart sounds: Normal heart sounds.   Pulmonary:      Effort: Pulmonary effort is normal.      Breath sounds: Normal breath sounds.   Abdominal:      General: Abdomen is flat.      Palpations: Abdomen is soft. There is no mass.   Musculoskeletal:         General: Normal range of motion.      Cervical back: Normal range of motion.   Skin:     General: Skin is warm and dry.   Neurological:      General: No focal deficit present.      Mental Status: He is alert and oriented to person, place, and time.   Psychiatric:         Mood and Affect: Mood normal.  RECENT LABS AND RECORDS REVIEW    On 03/2022: t chol 114, hdl 60, ldl 40.6    No results found for any visits on 11/17/22.   ASSESSMENT and PLAN    [name] was seen today for coronary artery disease.    Diagnoses and all orders for this visit:    Hyperlipidemia, unspecified hyperlipidemia type  Doing well. Continue current rx.  Agatston coronary artery calcium score greater than 400  Doing well. Continue current rx.     Return in about 1 year (around 11/18/2023).       Marzetta Board, MD  11/17/2022  8:18 AM

## 2022-11-23 ENCOUNTER — Encounter

## 2022-11-23 ENCOUNTER — Ambulatory Visit
Admit: 2022-11-23 | Discharge: 2022-11-23 | Payer: MEDICARE | Attending: Geriatric Medicine | Primary: Geriatric Medicine

## 2022-11-23 ENCOUNTER — Inpatient Hospital Stay: Admit: 2022-11-23 | Payer: MEDICARE | Primary: Geriatric Medicine

## 2022-11-23 DIAGNOSIS — G8929 Other chronic pain: Secondary | ICD-10-CM

## 2022-11-23 DIAGNOSIS — M25512 Pain in left shoulder: Secondary | ICD-10-CM

## 2022-11-23 MED ORDER — AMLODIPINE BESYLATE 2.5 MG PO TABS
2.5 MG | ORAL_TABLET | Freq: Every day | ORAL | 1 refills | Status: DC
Start: 2022-11-23 — End: 2023-11-27

## 2022-11-23 NOTE — Progress Notes (Signed)
SUBJECTIVE:   Christopher Gonzalez is a 82 y.o. 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, Divorced, son lives here, daughter lives in [place], likes to spend time with grandchildren, loves to read, play golf     Left shoulder pain - better, after PT  Right nose bleeds - intermittent, since 1 month  HTN, Hyperlipidemia - Sees cardiology, no headache  Prostate cancer in 2017-2018 - watchful waiting with urology; seeing Urology; Left gynecomastia with Nipple area tenderness since past year - advised to discuss stopping dutasteride with his urologist  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       Current Outpatient Medications   Medication Sig Dispense Refill    amLODIPine (NORVASC) 2.5 MG tablet Take 1 tablet by mouth daily 90 tablet 1    atorvastatin (LIPITOR) 20 MG tablet TAKE 1 TABLET EVERY DAY 90 tablet 3    dutasteride (AVODART) 0.5 MG capsule TAKE 1 CAPSULE EVERY DAY 90 capsule 3    tamsulosin (FLOMAX) 0.4 MG capsule TAKE 2 CAPSULES BY MOUTH DAILY. 180 capsule 1    tadalafil (CIALIS) 20 MG tablet TAKE 1 TABLET BY MOUTH AS NEEDED FOR ERECTILE DYSFUNCTION. 18 tablet 11    BIOTIN PO Take 5,000 mcg by mouth daily      aspirin 81 MG EC tablet Take 1 tablet by mouth daily      cyanocobalamin 100 MCG tablet Take 1 tablet by mouth daily      meclizine (ANTIVERT) 12.5 MG tablet Take by mouth as needed PRN      dutasteride (AVODART) 0.5 MG capsule Take 1 capsule by mouth daily 90 capsule 3    tadalafil (CIALIS) 20 MG tablet Take 1 tablet by mouth daily as needed for Erectile  Dysfunction 30 tablet 11    tamsulosin (FLOMAX) 0.4 MG capsule Take 2 capsules by mouth daily 180 capsule 3     No current facility-administered medications for this visit.     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    BCC (basal cell carcinoma of skin)    Chest pain    DDD (degenerative disc disease), cervical    Erectile dysfunction    Presbycusis of both ears    PVC (premature ventricular contraction)    Vertigo     Past Medical History:   Diagnosis Date    Elevated PSA     Erectile dysfunction     Hypercholesterolemia     Personal history of prostate cancer     Prostate cancer [facility])      Past Surgical History:   Procedure Laterality Date    APPENDECTOMY  1950    CHOLECYSTECTOMY      HERNIA REPAIR      MALIGNANT SKIN LESION EXCISION      basal cell - right cheek     Family History   Problem Relation Age of Onset    Dementia Mother     Stroke Father     No Known Problems Brother     No Known Problems Brother     Breast Cancer Neg Hx      Social History     Tobacco Use    Smoking status: Former     Current packs/day: 0.00     Average packs/day: 0.3 packs/day for 15.0 years (3.8 ttl pk-yrs)     Types: Cigarettes     Start date: 54     Quit date: 1989     Years since quitting: 35.1    Smokeless tobacco: Never   Substance Use Topics    Alcohol use: Yes     Alcohol/week: 2.0 standard drinks of alcohol         Review of Systems   Constitutional:  Negative for fever.   Respiratory:  Negative for cough and shortness of breath.    Cardiovascular:  Negative for chest pain and leg swelling.   Gastrointestinal:  Negative for abdominal pain.   Psychiatric/Behavioral:  Negative for behavioral problems and confusion.          OBJECTIVE:  BP 138/74   Pulse  68   Wt 79 kg (174 lb 3.2 oz)   BMI 28.99 kg/m      Physical Exam  Vitals and nursing note reviewed.   Constitutional:       General: He is not in acute distress.  Cardiovascular:      Rate and Rhythm: Normal rate and regular rhythm.   Pulmonary:      Effort: Pulmonary effort is normal.      Breath sounds: No wheezing.   Neurological:      General: No focal deficit present.      Mental Status: He is oriented to person, place, and time.   Psychiatric:         Mood and Affect: Mood normal.         Behavior: Behavior normal.         Medical problems and test results were reviewed with the patient today.     No results found for this or any previous visit (from the past 672 hour(s)).      ASSESSMENT and PLAN    Larin was seen today for hypertension.    Diagnoses and all orders for this visit:    Chronic left shoulder pain  -     [place]  -     XR SHOULDER LEFT (MIN 2 VIEWS); Future    Epistaxis  -     CBC with Auto Differential; Future  -     [facility] [place]    Essential hypertension  -     Comprehensive Metabolic Panel; Future    Other orders  -     amLODIPine (NORVASC) 2.5 MG tablet; Take 1 tablet by mouth daily      No follow-ups on file.     It took more than 40 min to care for this pt today  Over 50% of today's office visit was spent in face to face time in counseling   (may include anyor all of the following: reviewing test results, prognosis, importance  of compliance, education about disease process, benefits of medications, instructions for management of acute flare-ups, and follow up plans).

## 2022-11-24 ENCOUNTER — Encounter

## 2022-11-24 LAB — CBC WITH AUTO DIFFERENTIAL
Absolute Immature Granulocyte: 0 10*3/uL (ref 0.0–0.5)
Basophils %: 1 % (ref 0.0–2.0)
Basophils Absolute: 0 10*3/uL (ref 0.0–0.2)
Eosinophils %: 3 % (ref 0.5–7.8)
Eosinophils Absolute: 0.2 10*3/uL (ref 0.0–0.8)
Hematocrit: 43.2 % (ref 41.1–50.3)
Hemoglobin: 14 g/dL (ref 13.6–17.2)
Immature Granulocytes: 0 % (ref 0.0–5.0)
Lymphocytes %: 20 % (ref 13–44)
Lymphocytes Absolute: 1.4 10*3/uL (ref 0.5–4.6)
MCH: 28.6 PG (ref 26.1–32.9)
MCHC: 32.4 g/dL (ref 31.4–35.0)
MCV: 88.2 FL (ref 82–102)
MPV: 10.2 FL (ref 9.4–12.3)
Monocytes %: 8 % (ref 4.0–12.0)
Monocytes Absolute: 0.6 10*3/uL (ref 0.1–1.3)
Neutrophils %: 68 % (ref 43–78)
Neutrophils Absolute: 4.8 10*3/uL (ref 1.7–8.2)
Platelets: 145 10*3/uL — ABNORMAL LOW (ref 150–450)
RBC: 4.9 M/uL (ref 4.23–5.6)
RDW: 13.7 % (ref 11.9–14.6)
WBC: 7.1 10*3/uL (ref 4.3–11.1)
nRBC: 0 10*3/uL (ref 0.0–0.2)

## 2022-11-24 LAB — COMPREHENSIVE METABOLIC PANEL
ALT: 20 U/L (ref 12–65)
AST: 17 U/L (ref 15–37)
Albumin/Globulin Ratio: 1.2 (ref 0.4–1.6)
Albumin: 3.8 g/dL (ref 3.2–4.6)
Alk Phosphatase: 124 U/L (ref 50–136)
Anion Gap: 3 mmol/L (ref 2–11)
BUN: 20 MG/DL (ref 8–23)
CO2: 29 mmol/L (ref 21–32)
Calcium: 9.4 MG/DL (ref 8.3–10.4)
Chloride: 110 mmol/L (ref 103–113)
Creatinine: 0.9 MG/DL (ref 0.8–1.5)
Est, Glom Filt Rate: >60 mL/min/{1.73_m2} (ref >60)
Globulin: 3.3 g/dL (ref 2.8–4.5)
Glucose: 108 mg/dL — ABNORMAL HIGH (ref 65–100)
Potassium: 3.9 mmol/L (ref 3.5–5.1)
Sodium: 142 mmol/L (ref 136–146)
Total Bilirubin: 0.7 MG/DL (ref 0.2–1.1)
Total Protein: 7.1 g/dL (ref 6.3–8.2)

## 2022-11-24 NOTE — Other (Signed)
Blood tests show mild low platelets - advise to repeat CBC in 1 month - ordered.

## 2022-11-24 NOTE — Telephone Encounter (Signed)
Patient notified per Dr. Doreen Beam note "Blood tests show mild low platelets - advise to repeat CBC in 1 month "  - ordered.     Patient verbalized understanding

## 2022-11-29 ENCOUNTER — Ambulatory Visit
Admit: 2022-11-29 | Discharge: 2022-11-29 | Payer: MEDICARE | Attending: Physician Assistant | Primary: Geriatric Medicine

## 2022-11-29 ENCOUNTER — Encounter: Payer: MEDICARE | Attending: Geriatric Medicine | Primary: Geriatric Medicine

## 2022-11-29 DIAGNOSIS — R040 Epistaxis: Secondary | ICD-10-CM

## 2022-11-29 NOTE — Patient Instructions (Addendum)
Epistaxis plan;     TO PREVENT NOSE BLEEDS:   Saline spray:  Brands like Aquaphor or AYR may be used as needed throughout the day to keep moisture in the nose. I recommend morning and evening.  This will prevent rebleeding and keep moisture in.   Aquaphor ointment:  This is an option to keep the inside of the nose hydrated.  It will prevent rebleeding and may be used before bed and/or in the mornings.  Humidification : vaporizer or humidifier in the bedroom, especially in dry weather.   Saline Rinse: twice daily NeilMed sinus rinse. Helpful to remember luke warm water, keep your mouth open, and lean forward when rinsing. Do not force the rinse up but apply steady pressure to the bottle. Repeat as often as necessary.     TO STOP A BLEED: Afrin nasal decongestant:  Use this nasal spray only in the event of an active bleed.  Two sprays each nostril during an active bleed.  Then Spray a cotton ball with Afrin (until it's wet) and place it up the nose on the bleeding side. Leave it in there for 15 min before taking it out. May also use ice water gargles or cold packs to the face to assist in stopping a bleed.  Do not use this on a regular basis, only when actively bleeding. If this does not help, call our office.     Our goal is zero nosebleeds. If bleeding episodes continue, would recommend returning to clinic.

## 2022-11-29 NOTE — Progress Notes (Signed)
Christopher Gonzalez is a 82 y.o. male presents today with c/o epistaxis. Right side only, lasts only a few minutes and stops with pressure only. He takes aspirin but no other blood thinners. Bleeds happen 1-2 times per week, never more than 4 minutes. He gets 2-3 sinus infections annually. Congestion often and difficulty breathing through his nose. 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 As a kid he had some kind of nasal procedure that required packing. But that was nearly 70 years ago.    Chief Complaint   Patient presents with    New Patient    Epistaxis     Patient presents for epistaxis.  Patient states he had a nosebleed this morning anf it lasted about 1 minute.  Patient states that it also starts first thing in the morning and lasts between 4-5 minutes.  Patient is on 81 mg of aspirin.         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    HENT:  Positive for nosebleeds.    Eyes: Negative.    Respiratory: Negative.     Cardiovascular: Negative.    Gastrointestinal: Negative.    Endocrine: Negative.    Genitourinary: Negative.    Musculoskeletal: Negative.    Skin: Negative.    Allergic/Immunologic: Negative.    Neurological: Negative.    Hematological: Negative.    Psychiatric/Behavioral: Negative.          BP 138/72 (Site: Right Upper Arm, Position: Sitting)   Ht 1.651 m (5' 5"$ )   Wt 78.7 kg (173 lb 8 oz)   BMI 28.87 kg/m      Physical Exam:    General: Well developed, well nourished, in no acute distress  Communication: The patient communicates appropriately for their age.  Voice: Normal.  Head, Face, and Salivary Glands: No head or facial abnormalities present, No masses or lesions present, Overall appearance is normal, No abnormality of parotid or submandibular glands present.    External Ears: appearance is normal with no scars, lesions or masses.   Right Ear:  Canals is normal, Tympanic membrane with normal landmarks and normal mobility, no retraction, inflammation, effusion.  Left  Ear: Canal is normal, Tympanic membrane with normal landmarks and normal mobility, no retraction, inflammation, effusion.    Nose/Nasal Cavity: Nasal mucosa is pink/ moist.  Nasal septum is deviated left. right septal spur with dry irritated patch with recent bleeding in little's area.  Inferior turbinates are Hypertrophic.  Both nasal passages are congested and obstructed with septal deviation.    Lips/Gums/Teeth: Inspection of lips, gums and teeth are normal  Oral Cavity: normal oral mucosa, no visualized ulcers, masses or other lesions,  Palate normal, Tongue  normal, Floor of mouth normal. Mallampati Class 1 .  Oropharynx: Oropharynx normal and unobstructed, tonsils are  + 1  , no lesion or inflammation.     Neck/Thyroid: Normal appearance without mass, Trachea midline, No lymphadenopathy, No enlargement, tenderness or mass of thyroid noted.      Procedure: Procedure nasal endoscopy :  ENT Nasal/Sinus Endoscopy/Nasopharyngoscopy:    Informed Consent: Consent was obtained    Anesthesia/Decongestant: Phenylephrine, Lidocaine as indicated    Procedure: Due to the limitations of anterior rhinoscopy for adequate visualization of the posterior nasal cavity/cavities, sinus area(s), and/or nasopharynx, nasal and sinus endoscopy was performed.  Unless otherwise specified, interior of the nasal cavity and the inferior, middle and superior meatus/meati, the  turbinates, the spheno-ethmoid recess, and the nasopharynx were all inspected.  Pertinent positives are noted in exam findings.    Findings:  Nasal/sinus endoscopy: Endoscopic examination of the nose was done under local anesthesia with topical phenylephrine and lidocaine solution as needed, septum-- severe bilateral deviation, inferior turbinate hypertrophy before topical decongestant present,  middle/superior turbinate-- hypertrophic. Unable to visualize more posteriorly due to deviation limitations.     Complications: The patient had no complications during or immediately following the procedure, The patient tolerated the procedure well.      Assessment/Plan:  1. Epistaxis  -     NASAL ENDOSCOPY,DX  2. Nasal septal deviation  3. Hypertrophy of inferior nasal turbinate    Will treat epistaxis with nasal hydration, if persistent recommend cautery. Pt may also benefit from septoplasty and consideration of sinus CT, will call if he would like to proceed.     Return in about 4 weeks (around 12/27/2022) for Recheck Epistaxis.    Patient agrees with this plan.        Heath Lark, PA    This note was generated using voice recognition software, please excuse any typos.

## 2022-12-26 ENCOUNTER — Ambulatory Visit: Payer: MEDICARE | Primary: Geriatric Medicine

## 2022-12-26 DIAGNOSIS — C61 Malignant neoplasm of prostate: Secondary | ICD-10-CM

## 2022-12-26 MED ORDER — GADOTERIDOL 279.3 MG/ML IV SOLN
279.3 | Freq: Once | INTRAVENOUS | Status: AC | PRN
Start: 2022-12-26 — End: 2022-12-26
  Administered 2022-12-26: 21:00:00 15 mL via INTRAVENOUS

## 2022-12-26 MED FILL — PROHANCE 279.3 MG/ML IV SOLN: 279.3 MG/ML | INTRAVENOUS | Qty: 15

## 2022-12-27 ENCOUNTER — Encounter

## 2022-12-30 ENCOUNTER — Encounter: Payer: MEDICARE | Primary: Geriatric Medicine

## 2022-12-30 ENCOUNTER — Ambulatory Visit: Admit: 2022-12-30 | Discharge: 2022-12-30 | Payer: MEDICARE | Attending: Urology | Primary: Geriatric Medicine

## 2022-12-30 DIAGNOSIS — C61 Malignant neoplasm of prostate: Secondary | ICD-10-CM

## 2022-12-30 LAB — PSA, DIAGNOSTIC: PSA: 3.9 ng/mL (ref <4.0)

## 2022-12-30 NOTE — Progress Notes (Signed)
[facility]  [address] [place] [address]  [phone]        Mr. Demosthenes Hulme is a 82 y.o. 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 On Dutasteride and active surveillance.     INTERVAL HISTORY:    Mr. 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  He continues on Flomax and dutasteride with control of his urinary symptoms.    Patient returns today to review his MRI pelvis with and without contrast which was completed 12/26/2022.  His prostate measures approximately 6.5 x 6.0 x 5.0 cm for a volume of approximately 102 cc and a density of approx 0.06 accounting for a PSA of 3.3 on 09/23/22 (~6.6 on Dutasteride). He had a PI-RADS lesion in the right TZ and a PI-RADS 4 lesion in the right mid-gland TZ.    His PSA is up further to 3.9 today.    From previous note (09/23/22):  Mr. Hoak returns today for follow-up evaluation of his prostate cancer and BPH. He continues on dutasteride with control of his LUTS symptoms. He states that he voids with appropriate stream and empties his bladder entirely. He gets up once at night approximately 2-3 times per week. He denies any dysuria or hematuria. He request refills of his Flomax, dutasteride and Cialis at today's visit.     Past medical, family and social histories, as well as medications and allergies, were reviewed and updated in the medical record as appropriate.    PMH:     Past Medical History:   Diagnosis Date    Elevated PSA     Erectile dysfunction     Hypercholesterolemia     Personal history of prostate cancer     Prostate cancer (HCC)        MEDs:     amLODIPine  aspirin  atorvastatin  BIOTIN PO  cyanocobalamin  dutasteride  meclizine  tadalafil  tamsulosin     ALLERGIES:    Allergies   Allergen Reactions    Codeine Nausea And Vomiting       ROS:     Review of Systems    Constitutional:  Negative for unexpected weight change.   Genitourinary:  Negative for difficulty urinating, dysuria, frequency, hematuria and urgency.   All other systems reviewed and are negative.       PHYSICAL EXAMINATION    BP (!) 171/70   Pulse 59   Temp 97.9 F (36.6 C) (Oral)   Resp 20   Ht 1.651 m (5\' 5" )   Wt 80.7 kg (178 lb)   SpO2 98%   BMI 29.62 kg/m   General: well dressed, well nourished, no acute distress  Skin: no rashes  HEENT: Sclera are clear, normocephalic, atraumatic. no external lesions   Cardiovascular: Reg. Normal perfusion  Respiratory: normal respiratory effort, no JVD, no audible wheezing.  Musculoskeletal: unremarkable with normal function. No embolic signs or cyanosis.   Neurologic exam: intact, no focal deficits, moves all 4 extremities  Psych: normal mood and affect, alert, oriented x 3  LE:  no edema  GI/GU: DEFERRED      LABORATORY RESULTS:    Lab Results   Component Value Date/Time    PSA 3.9 12/30/2022 07:30 AM    PSA 3.3 09/23/2022 10:09 AM    PSA 2.5 03/21/2022 09:52 AM  IMAGING:      MRI Results:    === 12/26/22 ===    MRI PROSTATE W WO CONTRAST    - Narrative -  EXAMINATION: MRI PROSTATE W WO CONTRAST 12/26/2022 5:21 PM    ACCESSION NUMBER: [id number]    COMPARISON: MRI of June 15, 2019    INDICATION: Malignant neoplasm of prostate    TECHNIQUE:  Multiplanar, multisequence MR imaging was performed of the prostate  prior to and following gadolinium contrast. 15 mL Prohance contrast material was  administrated intravenously for the examination. This study was acquired  following the IV administration of contrast material, given the patient's  indications for the examination. If IV contrast material had not been  administered, the likely of detecting abnormalities relevant to the patient's  condition would have been substantially decreased.  All lesions assessed are  based on PI-RAD compliance.    FINDINGS:  The prostate measures 6.5 x 6.0 x 5.0 cm  giving a volume of 102 cc.    BPH: No significant BPH.    Hemorrhage: None.    Peripheral Zone: No suspicious peripheral zone lesion. No evidence of  prostatitis.    Transition/Central Zone: 2 suspicious transition zone lesions are demonstrated  as below.    Lesion # 1  -Size and location: 4.35 x 2.0 cm: Transitional zone lesion which has  significantly enlarged and extended into the bladder. This lesion has several  hypointense foci within it which show diffusion restriction and abnormal  enhancement concerning for malignancy. This is seen in the TZ anterior at the  base  -DWI/ADC: Positive  -DCE: Positive  -Prostate Margin: Intact  -PI-RADS Category: PI-RADS 5    Lesion # 2  -Size and location: Less than 1 cm transitional zone anterior lesion in the mid  prostate on the left which shows diffusion restriction and enhancement.  -DWI/ADC: Positive  -DCE: Positive  -Prostate Margin: Intact  -PI-RADS Category: PI-RADS 4    Neurovascular Bundles: Intact.    Seminal Vesicles: The seminal vesicles are unremarkable.    Lymph Nodes: Note is made of a round 9 mm lymph node on the right which shows  diffusion restriction and enhancement. This is enlarged significantly when  compared with 2020..    - Impression -  1.  PI-RADS 5 lesion in the transitional zone anteriorly at the base on the  right.  2.  PI-RADS 4 lesion on the left in the anterior transitional zone at the mid  prostate.  3.  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      ASSESSMENT:    Mr. Coltyn Beierle is a 82 y.o. 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  His PSA today is up further to 3.9 (~7.8 accounting for Dutasteride).  His MRI was reviewed and discussed with him at today's visit.  Rba of MRI fusion guided transperineal prostate biopsy were reviewed. We discussed possibility of moving forward with repeat prostate biopsy.  Patient would like to defer this for now.  Will plan for repeat PSA in 4 months and he  states that should it be elevated again he would like to consider biopsy at that time.    We reviewed with the patient the significance of an elevated PSA.  We discussed benign etiologies such as; benign enlargement of the prostate, inflammation of the prostate, infections of the prostate, and prostatic manipulation as a possible cause. We also discussed the possibility of prostate cancer. The  only way to really differentiate this would be a biopsy of the prostate. The most common way to diagnose prostate cancer is an ultrasound-guided biopsy of the prostate with our without MRI fusion.  Alternatively, we could repeat the PSA, do a free/total PSA, or just manage the patient with observation. We discussed the risks inherent in a biopsy including but not limited to; bleeding, infection, urinary retention, and pain associated with the procedure.  We recommend the patient stop any aspirin or nonsteroidal anti-inflammatories or other blood thinners approximately 5 days prior to the procedure. If the patient is taking any of these medications for heart issues or other specific reasons, he needs to consult with the prescribing doctor before stopping treatment.  We explained that we give him prophylactic antibiotic(s) around the time of the procedure to help prevent serious infectious complications (which have been on the rise).  We also discussed concerns with over-treatment of prostate cancer and that the main purpose of the biopsy is to hopefully rule out high-grade prostate cancer. After a full discussion regarding the potential risks, benefits, and treatment alternatives, the patient has decided to continue with active surveillance for now and will consider biopsy after his labs in 4 months.      PLAN:     - PSA 3.9 today (~7.8 accounting for Dutasteride)  - MRI pelvis with and without contrast reviewed  - Rba MRI fusion guided transperineal prostate biopsy discussed, deferred for now  - RTC in 4 months with PSA  prior  - DRE at next visit if biopsy is deferred  ________________________________________            I have seen and examined this patient.  I have reviewed and edited the note started by the MA and agree with the outlined plan.  Part of this note was written by using a voice dictation software. The note has been proof read but may still contain some grammatical/other typographical errors.      Marlyne Beards, PA-C  Urologic Oncology  [facility]      I have seen and examined this patient.  I have reviewed the note and agree with the outlined plan.  Patient has a history of 3+3 adenocarcinoma the prostate.  Of note he had 3 prior TRUS biopsies.  He is on dutasteride and his PSA today is up to 3.9.  We discussed his MRI findings.  On my review I do not completely agree with the read.  The transition zone lesions to me appear more like a BPH nodule and I would not score them a PI-RADS 5 and 4 lesion.  His gland measures 102 cc.  I have recommended repeat PSA in 4 to 6 months.  He understands repeat biopsy is certainly an option.      Merwyn Katos, [place]

## 2023-01-24 ENCOUNTER — Ambulatory Visit: Payer: MEDICARE | Primary: Geriatric Medicine

## 2023-01-27 ENCOUNTER — Encounter: Payer: MEDICARE | Attending: Urology | Primary: Geriatric Medicine

## 2023-01-27 ENCOUNTER — Other Ambulatory Visit: Payer: MEDICARE | Primary: Geriatric Medicine

## 2023-02-07 ENCOUNTER — Encounter: Payer: MEDICARE | Attending: Orthopaedic Surgery | Primary: Geriatric Medicine

## 2023-02-07 DIAGNOSIS — M25511 Pain in right shoulder: Secondary | ICD-10-CM

## 2023-02-08 ENCOUNTER — Encounter: Payer: MEDICARE | Attending: Geriatric Medicine | Primary: Geriatric Medicine

## 2023-03-28 ENCOUNTER — Ambulatory Visit: Admit: 2023-03-28 | Payer: MEDICARE | Primary: Diagnostic Radiology

## 2023-03-28 ENCOUNTER — Ambulatory Visit
Admit: 2023-03-28 | Discharge: 2023-03-28 | Payer: MEDICARE | Attending: Orthopaedic Surgery | Primary: Diagnostic Radiology

## 2023-03-28 DIAGNOSIS — M25512 Pain in left shoulder: Secondary | ICD-10-CM

## 2023-03-28 DIAGNOSIS — M25511 Pain in right shoulder: Secondary | ICD-10-CM

## 2023-03-28 MED ORDER — METHYLPREDNISOLONE ACETATE 40 MG/ML IJ SUSP
40 | Freq: Once | INTRAMUSCULAR | Status: AC
Start: 2023-03-28 — End: 2023-03-28

## 2023-03-28 NOTE — Progress Notes (Signed)
Name: Christopher Gonzalez  Date of Birth: May 28, 1941  Gender: male  MRN: [id number]    CC:   Chief Complaint   Patient presents with    Shoulder Pain     Cha [place]/L Shoulder   , bilateral shoulder pain    HPI:  This patient presents with a history of Right and left shoulder pain. Left greater than right.  The left shoulder bothers him more than the right.  The left has been intermittent for years.  He has previously done some physical therapy last year.  He states that in the past it has been some stinging down the arm with an electrical shock but that improved with therapy.  The right shoulder has been bothering him for several months.  Notes symptoms are similar.  Notes lateral shoulder pain worse with trying to reach overhead.  Does note some interference with sleep if he turns onto that side.  Denies popping, clicking, numbness, tingling.  Denies neck pain.    Allergies   Allergen Reactions    Codeine Nausea And Vomiting     Past Medical History:   Diagnosis Date    Elevated PSA     Erectile dysfunction     Hypercholesterolemia     Personal history of prostate cancer     Prostate cancer [facility])      Past Surgical History:   Procedure Laterality Date    APPENDECTOMY      1950    CHOLECYSTECTOMY      HERNIA REPAIR      MALIGNANT SKIN LESION EXCISION      basal cell - right cheek     Family History   Problem Relation Age of Onset    Dementia Mother     Stroke Father     No Known Problems Brother     No Known Problems Brother     Breast Cancer Neg Hx      Social History     Socioeconomic History    Marital status: Divorced     Spouse name: Not on file    Number of children: Not on file    Years of education: Not on file    Highest education level: Not on file   Occupational History    Not on file   Tobacco Use    Smoking status: Former     Current packs/day: 0.00     Average packs/day: 0.3 packs/day for 15.0 years (3.8 ttl pk-yrs)     Types: Cigarettes     Start date: 44     Quit date: 1989     Years since quitting: 35.5     Smokeless tobacco: Never   Substance and Sexual Activity    Alcohol use: Yes     Alcohol/week: 2.0 standard drinks of alcohol    Drug use: Never    Sexual activity: Not on file   Other Topics Concern    Not on file   Social History Narrative    Retired Equities trader - Garment/textile technologist.  From [place], [place], moved to area from [place]     Social Determinants of Health     Financial Resource Strain: Not on [employer] Insecurity: Not on file (03/29/2022)   Transportation Needs: Not on file   Physical Activity: Insufficiently Active (03/29/2022)    Exercise Vital Sign     Days of Exercise per Week: 2 days     Minutes of Exercise per Session: 60 min   Stress:  Not on file   Social Connections: Not on file   Intimate Partner Violence: Not At Risk (01/25/2021)    Humiliation, Afraid, Rape, and Kick questionnaire     Fear of Current or Ex-Partner: No     Emotionally Abused: No     Physically Abused: No     Sexually Abused: No   Housing Stability: Not on file               No data to display                Review of Systems  HTN, hx of basal cell carcinoma   Pertinent positives and negatives are addressed with the patient, particularly those related to musculoskeletal concerns.  Non-orthopaedic concerns were referred back to the primary care physician.    PE:    GEN: General appearance is that of a healthy patient, alert and oriented, in no distress. WDWN.  HEENT:  Normocephalic, atraumatic.  Extraocular muscles are intact.  Neck:  Supple.   Heart:  Regular pulse exam on all extremities.  Good color and warmth noted.  Lungs:  Normal non-labored breathing with no obvious shortness of breath.  Abdomen:  WNL's.  Skin:  No signs of rash or bruising.    Pysch: Answers questions appropriately, AO X 3.  MSK:    Cervical spine: No tenderness. Normal active motion. Negative Spurling's maneuver.     SHOULDER   Right  left   Skin Intact Intact   Radial Pulses 2+ Symmetrical 2+ Symmetrical   Deformity None Normal   Myotomes Normal  Normal   Dermatomes  Normal Normal    ROM Appropriate Appropriate   Strength No weakness No weakness   Atrophy None noted None noted   Effusion/Swelling  None noted None noted   Palpation TTP at the shoulder mild anteriorly Mild diffuse tenderness more so at the [facility] joint   Bicep Tendon Rupture  Negative Negative signs   Bear Hug, Belly Press Negative, negative Negative, negative   Crossed Arm Adduction Test normal Normal   Instability/Ant. Apprehension Test None noted None noted   Impingement Positive  Neer, hawkins, AOM Positive N,H and AOM     X-rays:   Grashey, axillary and outlet views of the Right and left shoulder that were obtained and reviewed today in the office, show a type 2 acromion on the right and 1 on the left.  The humeral head is not high riding on the left and  on the right.  Evidence of calcific tendinitis on the left.  No evidence of fracture, arthritis, dislocation or loose body.  Overall the glenohumeral joint spaces are well-maintained  Moderate degenerative changes of the A-C joint on the left and moderate on the right.     Impression: Right shoulder appears normal  and the left calcific tendinitis    Assessment:     ICD-10-CM    1. Left shoulder pain, unspecified chronicity  M25.512 methylPREDNISolone acetate (DEPO-MEDROL) injection 80 mg     DRAIN/INJECT LARGE JOINT/BURSA     Ambulatory referral to Physical Therapy      2. Right shoulder pain, unspecified chronicity  M25.511 XR SHOULDER RIGHT (MIN 2 VIEWS)     methylPREDNISolone acetate (DEPO-MEDROL) injection 80 mg     DRAIN/INJECT LARGE JOINT/BURSA     Ambulatory referral to Physical Therapy          Plan: I had a long discussion with the patient regarding the natural history of the problem, as  well as treatment options.  I am very pleased with patient's strength and range of motion.  He is exhibiting signs and symptoms of bursitis/calcific tendinitis.  I discussed with the patient treatment options including oral medication, injections,  advanced imaging, physical therapy and ultimately surgical intervention.  At this time, the patient has decided with me to proceed with bilateral corticosteroid injection and PT/HEP.      Treatment:   Recommend therapy to evaluate and treat current complaints and pathology. A home exercise program was also discussed with the patient.    Procedure note: After discussion of risks and benefits including, but not limited to pain, infection, steroid flare, increased blood sugar, fat necrosis, skin discoloration, and injury to blood vessels or nerves, the patient verbally consented to proceed with a subacromial injection.  They understand that we are proceeding with this in a way to avoid proceeding with more definitive major surgical options.  The affected bilateral shoulder was sterilely prepped in standard fashion and injected with 2 cc of depo medrol (40mg /ml), 2 cc of 1% Lidocaine, and 2 cc of 0.5% Marcaine into the subacromial space.  The patient tolerated the injection well.      Patient is of increased medical complexity and increased risk for surgical intervention secondary to PVC, HTN, history of basal cell carcinoma.    Return in about 6 weeks (around 05/09/2023).    Thank you for the opportunity to see your patient.  If you have any questions please give me a call.  Sincerely,    Herschel Senegal, MD  03/28/23

## 2023-03-28 NOTE — Patient Instructions (Signed)
Patient Education        Rotator Cuff: Exercises  Introduction  Here are some examples of exercises for you to try. The exercises may be suggested for a condition or for rehabilitation. Start each exercise slowly. Ease off the exercises if you start to have pain.  You will be told when to start these exercises and which ones will work best for you.  How to do the exercises  Pendulum swing    Hold on to a table or the back of a chair with your unaffected arm. Then bend forward a little and let your affected arm hang straight down. This exercise does not use the arm muscles. Rather, use your legs and your hips to create movement that makes your arm swing freely.  Use the movement from your hips and legs to guide the slightly swinging arm forward and backward like a pendulum (or elephant trunk). Then guide it in circles that start small (about the size of a dinner plate). Make the circles a bit larger each day, as your pain allows.  Do this exercise for at least 1 minute. Do it at least 3 times a day.  As you have less pain, try bending over a little farther to do this exercise. This will increase the amount of movement at your shoulder.  Shoulder stretch (posterior)    Relax your shoulders. Hold the elbow of your affected arm with your other hand.  Use your hand to pull your affected arm gently up and across your body. You will feel a gentle stretch across the back of your affected shoulder.  Hold for at least 15 to 30 seconds, then slowly lower your arm.  Repeat 2 to 4 times.  If you can, repeat these steps for your other shoulder.  Up-the-back shoulder stretch    Your doctor or physical therapist may advise you to wait to do this stretch until you have regained most of your range of motion and strength.  Stand or sit up straight. If you're standing, keep your feet about hip-width apart.  Hold any of these stretches for at least 15 to 30 seconds.  Repeat 2 to 4 times.  If you can, repeat these steps for your other  shoulder.  Light stretch: Put the hand of your affected arm in your back pocket (palm outward). Let it rest there to stretch your shoulder.  Moderate stretch: With your other hand, hold your affected arm (palm outward) behind your back by the wrist. Pull your arm up gently to stretch your shoulder.  Advanced stretch: Put a towel over your other shoulder. Put the hand of your affected arm behind your back. Now hold the back end of the towel. With the other hand, hold the front end of the towel in front of your body. Pull gently on the front end of the towel. This will bring your hand farther up your back to stretch your shoulder.  Shoulder extensor stretch (supported)    Standing about an arm's length away, grasp onto a solid surface. You could use a countertop, a doorknob, or the back of a sturdy chair.  With your knees slightly bent, bend forward with your arms straight. Lower your upper body, and let your shoulders stretch. As your shoulders are able to stretch farther, you may need to take a step or two backward.  Hold for at least 15 to 30 seconds. Then stand up and relax. If you stepped back during your stretch, step forward as you   stand up so you can keep your hands on the solid surface.  Repeat 2 to 4 times.  Shoulder extensor stretch (lying down, with wand)    Lie on your back with your knees bent. Hold a wand with both hands, placing one hand near each end of the wand. (You can also use a broom handle or anything stiff and about 3 feet long.) Your palms should face down as you hold the wand. Straighten your elbows and rest the wand on your legs, just below your hips. This is your starting position.  Keeping your elbows straight, slowly raise your arms over your head. Raise them until you feel a stretch in your shoulders, upper back, and chest. Try not to shrug your shoulders.  Hold for 15 to 30 seconds, and then return to the starting position.  Repeat 2 to 4 times.  Shoulder rotation (lying down, with  wand)    Lie on your back. Hold a wand with both hands with your elbows bent and palms up. You can also use a broom handle or anything stiff and about 3 feet long.  Hold your elbows close to your body, and move the wand across your body toward the affected arm.  Hold for 15 to 30 seconds, and then return to the starting position.  Repeat 2 to 4 times.  It's a good idea to repeat these steps toward your other arm.  Wall climb (to the side)    Stand with your affected shoulder toward the wall, about an arm's length away.  With your affected arm slightly in front of you (about 30 degrees), walk your fingers up the wall as high as pain permits. Keep your shoulder relaxed and down, not shrugged. Keep your body straight, and don't lean to the side.  Hold that position for at least a few seconds.  Slowly walk your fingers back down to the starting position.  Repeat at least 2 to 4 times. Try to reach higher each time.  It's a good idea to repeat these steps with your other arm.  Wall climb (to the front)    Face a wall, and stand so your fingers can just touch it.  Keeping your shoulder down (don't shrug), walk the fingers of your affected arm up the wall as high as pain permits. Keep your back straight. Don't arch your back.  Hold that position for at least a few seconds.  Slowly walk your fingers back down.  Repeat at least 2 to 4 times. Try to reach higher each time. When you are able to reach higher, you can take a step toward the wall as you walk your fingers up, then step back as you walk your fingers back down.  It's a good idea to repeat these steps with your other arm.  Shoulder-blade squeeze    Sit or stand up straight with your arms at your sides.  Keep your shoulders relaxed and down, not shrugged.  Squeeze your shoulder blades down and together.  Hold for about 6 seconds, then relax.  Repeat 8 to 12 times.  Scapular arm reach (lying down)    Lie on your back on a firm surface, like the floor or a firm bed. You  can bend your knees or put a pillow under your knees.  Point your arms toward the ceiling. Keep your elbows straight.  Reach higher toward the ceiling. Keep your elbows straight. All motion should be from your shoulder blades. Your shoulders will come off   the floor or bed a little bit.  Slowly relax and return your arms to the starting position.  Repeat 8 to 12 times.  Arm raise to the side    Start with your affected arm at your side, with your palm facing forward.  Slowly raise your affected arm to the side.  Hold the position for 1 to 2 seconds. Then slowly lower your arm back to your side. If you need to, bring your other arm across your body and place it under the elbow as you lower your affected arm. Use that other arm to keep your affected arm from dropping down too fast.  Repeat 8 to 12 times.  It's a good idea to repeat these steps with your other arm.  Keep your arm a little in front of your body (about 30 degrees), not straight out to the side.  As you move, keep your shoulder relaxed (don't shrug) and your thumb up.  Stop before your arm is at shoulder height.  When you first start out, don't hold any extra weight in your hand. As you get stronger, you may use a 1- to 2-pound weight, a small can of food, or a filled water bottle.  Shoulder flexion (isometric)    Hold your affected arm against your body, and bend your elbow about 90 degrees (like the letter "L"), with your hand straight ahead. Make a closed fist, with your thumb on top.  Face a wall, and step forward until your fist is against the wall. Your elbow should still be against your body.  Press your fist against the wall with about half of your strength or less. Don't let your body move backward as you press.  Hold for about 6 seconds, and then relax.  Repeat 8 to 12 times.  It's a good idea to repeat these steps with your other arm.  Shoulder extension (isometric)    Stand with your back against a wall and the upper part of your affected arm  against the wall. Bend your elbow about 90 degrees (like the letter "L"), with your hand straight ahead.  Press your elbow back against the wall with about half of your strength or less. Don't let your body move forward as you press.  Hold for about 6 seconds, and then relax.  Repeat 8 to 12 times.  It's a good idea to repeat these steps with your other arm.  Wall push-up    Stand facing a wall with your feet about 12 to 24 inches from the wall. If you feel any pain when you do this exercise, stand closer to the wall.  Place your hands on the wall at shoulder height, slightly wider apart than your shoulders. Turn your fingers out a little, rather than straight up and down.  Slowly bend your elbows and bring your face toward the wall, keeping your shoulders and hips lined up. Then slowly push back to the starting position. Keep the motion smooth and controlled.  Repeat 8 to 12 times.  When you can do this exercise against a wall with ease and no pain, you can try it against a counter. You can then slowly progress to the end of a couch, then to a sturdy chair, and finally to the floor.  Resisted row    Anchor an exercise band at about waist level. You can loop the band around a solid object, like a bedpost or handrail. Or you can tie a knot in the middle of the   band and shut a door on the band so the knot is on the other side of the door. (Or you can have someone hold one end of the loop to provide resistance.)  Stand or sit facing where you have placed the band. Hold one end of the band in each hand.  Hold your arms out in front of you. Adjust your hold on the band so you have some tension on it.  With your shoulders relaxed, pull the bands back, and move your shoulder blades toward each other. Your elbows will pass along your waist.  Slowly return to the starting position.  Repeat 8 to 12 times.  Shoulder internal rotation (resisted)    Tie the ends of an exercise band together to form a loop. Attach one end of the  loop to a secure object, or shut a door on it to hold it in place. Or you can tie a knot in one end of the band and shut the door with the knot on the other side. The band should be at about waist height.  Stand or sit with your affected side toward the door.  Hold the free end of the exercise band with the hand of your affected arm, and bend your elbow to 90 degrees. Keep your upper arm against your body. You can squeeze a rolled towel between your elbow and your body for comfort. This will help keep your arm at your side.  Start with your arm pointing straight ahead and your shoulder relaxed. Slowly rotate your forearm toward your body until it touches your belly. As you do this, keep your elbow and upper arm firmly tucked against the towel or at your side. Slowly move it back to where you started. Your shoulder should stay relaxed throughout the exercise.  Repeat 8 to 12 times.  It's a good idea to repeat these steps with your other arm.  Shoulder external rotation (resisted)    Tie the ends of an exercise band together to form a loop. Attach one end of the loop to a secure object, or shut a door on it to hold it in place. Or you can tie a knot in one end of the band and shut the door with the knot on the other side. The band should be at about waist height.  Stand or sit with your unaffected side toward the door.  Hold one end of the band with the hand of your affected arm, and bend your elbow to 90 degrees. Keep your upper arm against your body. You can squeeze a rolled towel between your elbow and your body for comfort. This will help keep your arm at your side.  Start with your forearm across your belly and your shoulder relaxed. Slowly rotate your forearm out away from your body. Keep your elbow and upper arm tucked against the towel roll or the side of your body until you begin to feel tightness in your shoulder. Slowly move your arm back to where you started. Your shoulder should stay relaxed throughout  the exercise.  Repeat 8 to 12 times.  It's a good idea to repeat these steps with your other arm.  Follow-up care is a key part of your treatment and safety. Be sure to make and go to all appointments, and call your doctor if you are having problems. It's also a good idea to know your test results and keep a list of the medicines you take.  Current as of: April 18, 2022  Content Version: 14.1   2006-2024 [employer].   Care instructions adapted under license by [facility]. If you have questions about a medical condition or this instruction, always ask your healthcare professional. [employer] disclaims any warranty or liability for your use of this information.

## 2023-04-13 ENCOUNTER — Inpatient Hospital Stay: Admit: 2023-04-13 | Payer: Medicare Other | Primary: Internal Medicine

## 2023-04-13 DIAGNOSIS — G8929 Other chronic pain: Secondary | ICD-10-CM

## 2023-04-13 DIAGNOSIS — M25511 Pain in right shoulder: Secondary | ICD-10-CM

## 2023-04-13 NOTE — Progress Notes (Signed)
Christopher Gonzalez  DOB: 1940/10/16  Primary: Medicare Part A And B (Medicare)  Secondary: [facility] CARE MEDICARE SUPP [facility]  [address] [place]  [place] [address]  Phone: [phone]  Fax: [phone] Plan Frequency: 2/week x 8 weeks    Plan of Care/Certification Expiration Date: 06/08/23        Plan of Care/Certification Expiration Date:  Plan of Care/Certification Expiration Date: 06/08/23    Frequency/Duration:   Plan Frequency: 2/week x 8 weeks      Time In/Out:   Time In: 1015  Time Out: 1100      PT Visit Info:    Total # of Visits to Date: 1      Visit Count:  1    OUTPATIENT PHYSICAL THERAPY:   Treatment Note 04/13/2023       Episode  (B shoulder pain)               Treatment Diagnosis:    Chronic right shoulder pain  Chronic left shoulder pain  Stiffness of left shoulder, not elsewhere classified  Stiffness of right shoulder, not elsewhere classified  Medical/Referring Diagnosis:    Left shoulder pain, unspecified chronicity  Right shoulder pain, unspecified chronicity      Referring Physician:  Allyne Gee, MD  MD Orders:  PT Eval and Treat   Return MD Appt:  prn   Date of Onset:  Onset Date: 12/02/22     Allergies:   Codeine  Restrictions/Precautions:   None      Interventions Planned (Treatment may consist of any combination of the following):  Current Treatment Recommendations: Strengthening; ROM; Home exercise program; Manual    See Assessment Note    Subjective Comments:   See eval  Initial Pain Level::     0 /10  Post Session Pain Level:      0  /10  Medications Last Reviewed:  04/13/2023  Updated Objective Findings:  See Evaluation Note from today  Treatment   THERAPEUTIC EXERCISE: (20 minutes):    Exercises per grid below to improve mobility and strength.  Required moderate visual, verbal, and manual cues to promote proper body alignment and promote proper body posture.  Progressed resistance and repetitions as indicated.   Date:  04/13/23 Date:    Date:     Activity/Exercise Parameters Parameters Parameters   Pulleys flex  3 min     Cervical retraction 2 x 10     ER stretch Wand x 5     Y,T,I X 10 each Bilat                       HEP as above    Treatment/Session Summary:    Treatment Assessment:   Did well with ex.  Currently not painful following injections.  Weakness in scapular and humeral stabilizers  Communication/Consultation:  Therapy Evaluation sent to referring provider  Equipment provided today:  HEP pulleys  Recommendations/Intent for next treatment session: Next visit will focus on B shoulder motions and strength.    >Total Treatment Billable Duration:  20 minutes   Time In: 1015  Time Out: 1100    Franchot Gallo, PT         Charge Capture  Events  [employer] Portal  Appt Desk  Attendance Report     Future Appointments   Date Time Provider Department Center   05/01/2023  9:00 AM Franchot Gallo, PT [facility]   05/05/2023  9:00 AM Franchot Gallo, PT [facility]   05/08/2023  9:00 AM Franchot Gallo, PT [facility]   05/09/2023 10:45 AM Allyne Gee, MD [facility]   05/12/2023  9:00 AM Franchot Gallo, PT [facility]   05/19/2023  9:00 AM Jennette Kettle, Holston Oyama, PT [facility]   05/22/2023  9:00 AM Jennette Kettle, Rihaan Barrack, PT [facility]   05/26/2023  9:00 AM Franchot Gallo, PT [facility]   05/29/2023  9:00 AM Franchot Gallo, PT [facility]   06/02/2023  9:00 AM Jennette Kettle, Carmyn Hamm, PT [facility]   06/09/2023  9:00 AM Jennette Kettle, Cailyn Houdek, PT [facility]

## 2023-04-13 NOTE — Other (Signed)
Christopher Gonzalez  DOB: 11-17-1940  Primary: Medicare Part A And B (Medicare)  Secondary: [facility] CARE MEDICARE SUPP [facility]  [address] [place]  [place] [address]  Phone: [phone]  Fax: [phone] Plan Frequency: 2/week x 8 weeks    Plan of Care/Certification Expiration Date: 06/08/23        Plan of Care/Certification Expiration Date:  Plan of Care/Certification Expiration Date: 06/08/23    Frequency/Duration: Plan Frequency: 2/week x 8 weeks      Time In/Out:   Time In: 1015  Time Out: 1100      PT Visit Info:    Total # of Visits to Date: 1      Visit Count:  1                OUTPATIENT PHYSICAL THERAPY:             Initial Assessment 04/13/2023               Episode (B shoulder pain)         Treatment Diagnosis:     Chronic right shoulder pain  Chronic left shoulder pain  Stiffness of left shoulder, not elsewhere classified  Stiffness of right shoulder, not elsewhere classified  Medical/Referring Diagnosis:    Left shoulder pain, unspecified chronicity  Right shoulder pain, unspecified chronicity      Referring Physician:  Allyne Gee, MD  MD Orders:  PT Eval and Treat   Return MD Appt:  prn  Date of Onset:  Onset Date: 12/02/22     Allergies:  Codeine  Restrictions/Precautions:    None      Medications Last Reviewed:  04/13/2023     SUBJECTIVE   History of Injury/Illness (Reason for Referral):  Pt presents with B shoulder pain but it is much better after injections about 3 weeks ago.  Pt states he is pain free after injections. He has a history of radiating pain from shoulder to 3rd finger on L. About a year ago he had PT for that pain and it is gone.  Retired but [employer]'s [employer] and plays golf monthly but also goes to driving range 2-3 weekly and spends time with grandkids.  He does workout some at home.  Patient Stated Goal(s):  "shoulder strength"  Initial Pain Level:    0   /10   Post Session Pain Level:   0   /10  Past Medical History/Comorbidities:    Christopher Gonzalez  has a past medical history of Elevated PSA, Erectile dysfunction, Hypercholesterolemia, Personal history of prostate cancer, and Prostate cancer (HCC).  Christopher Gonzalez  has a past surgical history that includes Cholecystectomy; malignant skin lesion excision; Appendectomy; and hernia repair.  Social History/Living Environment:   Patient lives alone    Prior Level of Function/Work/Activity:   Prior Level of Function: Independent      Learning:   Does the patient/guardian have any barriers to learning?: No barriers  Will there be a co-learner?: No  What is the preferred language of the patient/guardian?: English  Is an interpreter required?: No  How does the patient/guardian prefer to learn new concepts?: Listening; Reading; Demonstration    Fall Risk Scale:   Morse Total Score: 0         OBJECTIVE   Pain- no pain currently  ROM-cervical flex- 90%  Ext- 50%                         Rot- L 50%  R 75%  L shoulder - PROM- flex- 160                                   ER at 90 abd- 75                                   IR at 90 55  R shoulder PROM flex- 150                                 ER at 90 - 65                                 IR at 90 60  Strength- ER- L4/5  r 4+/5                  IR  L 4+/5  Non painful with resistance    ASSESSMENT   Initial Assessment:  Pt with recent onset of L shoulder pain and some pain in R shoulder.  He had injections and pain is improved. He is a good candidate for PT for goals listed below    Therapy Problem List: (Impacting functional limitations):    Body Structures, Functions, Activity Limitations Requiring Skilled Therapeutic Intervention: Decreased strength; Decreased functional mobility ; Increased pain; Decreased ROM    Therapy Prognosis:     Therapy Prognosis: Good    Initial Assessment Complexity:     Decision Making: Medium Complexity      PLAN   Effective Dates: 04/13/2023 TO Plan of Care/Certification Expiration Date: 06/08/23      Frequency/Duration: Plan Frequency: 2/week x 8 weeks      Interventions Planned (Treatment may consist of any combination of the following):    Current Treatment Recommendations: Strengthening; ROM; Home exercise program; Manual      GOALS: (Goals have been discussed and agreed upon with patient.)  Short-Term Functional Goals: Time Frame: 3 weeks  Report no more than 3/10 intermittent pain to B shoulders with compensatory use during basic functional activities.  B shoulders PROM forward elevation greater than 160 degrees and external rotation greater than 80 degrees to progress into functional ranges.  Demonstrate good B shoulders isometric strength with manual testing to progress into strength phase.  Independent with initial HEP.  Discharge Goals: Time Frame: 8 weeks  Demonstrate good functional shoulder strength and endurance for return to normalized household and work activities.    Independent with advanced shoulder HEP for continued self-management.            Outcome Measure:   Tool Used: Disabilities of the Arm, Shoulder and Hand (DASH) Questionnaire - Quick Version  Score:  Initial: 8/55  Most Recent: X/55 (Date: -- )   Interpretation of Score: The DASH is designed to measure the activities of daily living in person's with upper extremity dysfunction or pain.  Each section is scored on a 1-5 scale, 5 representing the greatest disability.  The scores of each section are added together for a total score of 55.  Medical Necessity:   > Skilled intervention continues to be required due to B shoulder pain and weakness.  Reason For Services/Other Comments:  > Patient continues to require skilled intervention due to goals above not met.      Regarding Christopher Gonzalez's therapy, I certify that the treatment plan above will be carried out by a therapist or under their direction.  Thank you for this referral,  Franchot Gallo, PT     Referring Physician Signature: Allyne Gee, MD  _______________________________ Date _____________        Charge Capture  Events  Appt Desk  Attendance Report

## 2023-05-01 ENCOUNTER — Inpatient Hospital Stay: Admit: 2023-05-01 | Payer: MEDICARE | Primary: Diagnostic Radiology

## 2023-05-01 DIAGNOSIS — C61 Malignant neoplasm of prostate: Secondary | ICD-10-CM

## 2023-05-01 LAB — PSA, DIAGNOSTIC: PSA: 3.4 ng/mL (ref 0.0–4.0)

## 2023-05-01 NOTE — Progress Notes (Signed)
Christopher Gonzalez  DOB: 1940-10-21  Primary: Medicare Part A And B (Medicare)  Secondary: [facility] CARE MEDICARE SUPP [facility] [employer]  [address]  [place] [address]  Phone: [phone]  Fax: [phone] Plan Frequency: 2/week x 8 weeks    Plan of Care/Certification Expiration Date: 06/08/23        Plan of Care/Certification Expiration Date:  Plan of Care/Certification Expiration Date: 06/08/23    Frequency/Duration:   Plan Frequency: 2/week x 8 weeks      Time In/Out:   Time In: 0900  Time Out: 0945      PT Visit Info:    Total # of Visits to Date: 2      Visit Count:  2    OUTPATIENT PHYSICAL THERAPY:   Treatment Note 05/01/2023       Episode  (B shoulder pain)               Treatment Diagnosis:    No data found  Medical/Referring Diagnosis:          Referring Physician:  Allyne Gee, MD  MD Orders:  PT Eval and Treat   Return MD Appt:  prn   Date of Onset:  Onset Date: 12/02/22     Allergies:   Codeine  Restrictions/Precautions:   None      Interventions Planned (Treatment may consist of any combination of the following):  Current Treatment Recommendations: Strengthening; ROM; Home exercise program; Manual    See Assessment Note    Subjective Comments:   I have been doing well. No pain  Initial Pain Level::     0 /10  Post Session Pain Level:      0  /10  Medications Last Reviewed:  05/01/2023  Updated Objective Findings:  None Today  Treatment   THERAPEUTIC EXERCISE: (20 minutes):    Exercises per grid below to improve mobility and strength.  Required moderate visual, verbal, and manual cues to promote proper body alignment and promote proper body posture.  Progressed resistance and repetitions as indicated. PROM in all directions in supine for stiffness   Date:  04/13/23 Date:  7/29 Date:     Activity/Exercise Parameters Parameters Parameters   Pulleys flex  3 min 1 min    Cervical retraction 2 x 10 1 x 10    ER stretch Wand x 5     Y,T,I X 10 each Bilat      Cervical retr with ext  X 5    rows  Blue band 3 x 10    Shoulder ext  Blue 3 x 10    ER/IR  Blue 2 x 10 each arm    HEP as above    Treatment/Session Summary:    Treatment Assessment:   Continues to do well with ex  Communication/Consultation:  None today  Equipment provided today:  HEP   Recommendations/Intent for next treatment session: Next visit will focus on B shoulder motions and strength.    >Total Treatment Billable Duration:  45 minutes   Time In: 0900  Time Out: 0945    Franchot Gallo, PT         Charge Capture  Events  MedBridge Portal  Appt Desk  Attendance Report     Future Appointments   Date Time Provider Department Center   05/05/2023  9:00 AM Franchot Gallo, PT [facility]   05/08/2023  9:00 AM Franchot Gallo, PT [facility]   05/09/2023 10:45  AM Allyne Gee, MD [facility]   05/12/2023  9:00 AM Franchot Gallo, PT [facility]   05/19/2023  9:00 AM Jennette Kettle, Skylarr Liz, PT [facility]   05/22/2023  9:00 AM Franchot Gallo, PT [facility]   05/26/2023  9:00 AM Franchot Gallo, PT [facility]   05/29/2023  9:00 AM Franchot Gallo, PT [facility]   06/02/2023  9:00 AM Franchot Gallo, PT [facility]   06/09/2023  9:00 AM Jennette Kettle, Shanedra Lave, PT [facility]

## 2023-05-05 ENCOUNTER — Encounter: Payer: MEDICARE | Primary: Diagnostic Radiology

## 2023-05-08 ENCOUNTER — Encounter: Payer: MEDICARE | Primary: Diagnostic Radiology

## 2023-05-09 ENCOUNTER — Encounter: Payer: MEDICARE | Attending: Orthopaedic Surgery | Primary: Diagnostic Radiology

## 2023-05-09 DIAGNOSIS — M25512 Pain in left shoulder: Secondary | ICD-10-CM

## 2023-05-09 NOTE — Progress Notes (Unsigned)
Name: Christopher Gonzalez  Date of Birth: 1940-12-24  Gender: male  MRN: [id number]    CC: No chief complaint on file.       HPI:   Patient presents for follow-up evaluation of bilateral shoulders.  Patient had bilateral subacromial injection on 03-28-23.  We also sent the patient for physical therapy.  Today he states***  At the patient's last visit, his left shoulder was more painful than the right.  We discussed bursitis/calcific tendinitis at the patient's last visit.     Allergies   Allergen Reactions    Codeine Nausea And Vomiting     Past Medical History:   Diagnosis Date    Elevated PSA     Erectile dysfunction     Hypercholesterolemia     Personal history of prostate cancer     Prostate cancer [facility])      Past Surgical History:   Procedure Laterality Date    APPENDECTOMY      1950    CHOLECYSTECTOMY      HERNIA REPAIR      MALIGNANT SKIN LESION EXCISION      basal cell - right cheek     Family History   Problem Relation Age of Onset    Dementia Mother     Stroke Father     No Known Problems Brother     No Known Problems Brother     Breast Cancer Neg Hx      Social History     Socioeconomic History    Marital status: Divorced     Spouse name: Not on file    Number of children: Not on file    Years of education: Not on file    Highest education level: Not on file   Occupational History    Not on file   Tobacco Use    Smoking status: Former     Current packs/day: 0.00     Average packs/day: 0.3 packs/day for 15.0 years (3.8 ttl pk-yrs)     Types: Cigarettes     Start date: 56     Quit date: 1989     Years since quitting: 35.6    Smokeless tobacco: Never   Substance and Sexual Activity    Alcohol use: Yes     Alcohol/week: 2.0 standard drinks of alcohol    Drug use: Never    Sexual activity: Not on file   Other Topics Concern    Not on file   Social History Narrative    Retired Equities trader - Garment/textile technologist.  From [place], [place], moved to area from [place]     Social Determinants of Health     Financial  Resource Strain: Not on file   Food Insecurity: Not on file (03/29/2022)   Transportation Needs: Not on file   Physical Activity: Insufficiently Active (03/29/2022)    Exercise Vital Sign     Days of Exercise per Week: 2 days     Minutes of Exercise per Session: 60 min   Stress: Not on file   Social Connections: Not on file   Intimate Partner Violence: Not At Risk (01/25/2021)    Humiliation, Afraid, Rape, and Kick questionnaire     Fear of Current or Ex-Partner: No     Emotionally Abused: No     Physically Abused: No     Sexually Abused: No   Housing Stability: Not on file  No data to display                Review of Systems  Non-contributory    PE:    ***     SHOULDER   Right  left   Skin Intact Intact   Radial Pulses 2+ Symmetrical 2+ Symmetrical   Deformity None Normal   Myotomes Normal Normal   Dermatomes  Normal Normal    ROM Appropriate Appropriate   Strength No weakness No weakness   Atrophy None noted None noted   Effusion/Swelling  None noted None noted   Palpation TTP at the shoulder mild anteriorly Mild diffuse tenderness more so at the [facility] joint   Bicep Tendon Rupture  Negative Negative signs   Bear Hug, Belly Press Negative, negative Negative, negative   Crossed Arm Adduction Test normal Normal   Instability/Ant. Apprehension Test None noted None noted   Impingement Positive  Neer, hawkins, AOM Positive N,H and AOM      Recall radiographs from 03-28-23     Impression: Right shoulder appears normal  and the left calcific tendinitis     A/Plan:     ICD-10-CM    1. Left shoulder pain, unspecified chronicity  M25.512       2. Right shoulder pain, unspecified chronicity  M25.511            ***      Patient is of increased medical complexity and increased risk for surgical intervention secondary to PVC, HTN, history of basal cell carcinoma.   No follow-ups on file.        [place], [place]  05/05/23

## 2023-05-12 ENCOUNTER — Inpatient Hospital Stay: Admit: 2023-05-12 | Payer: MEDICARE | Primary: Diagnostic Radiology

## 2023-05-12 NOTE — Progress Notes (Signed)
Christopher Gonzalez  DOB: August 29, 1941  Primary: Medicare Part A And B (Medicare)  Secondary: [facility] CARE MEDICARE SUPP [facility] [employer]  [address]  [place] [address]  Phone: [phone]  Fax: [phone] Plan Frequency: 2/week x 8 weeks    Plan of Care/Certification Expiration Date: 06/08/23        Plan of Care/Certification Expiration Date:  Plan of Care/Certification Expiration Date: 06/08/23    Frequency/Duration:   Plan Frequency: 2/week x 8 weeks      Time In/Out:   Time In: 0900  Time Out: 0940      PT Visit Info:    Total # of Visits to Date: 3      Visit Count:  3    OUTPATIENT PHYSICAL THERAPY:   Treatment Note 05/12/2023       Episode  (B shoulder pain)               Treatment Diagnosis:    No data found  Medical/Referring Diagnosis:          Referring Physician:  Allyne Gee, MD  MD Orders:  PT Eval and Treat   Return MD Appt:  prn   Date of Onset:  Onset Date: 12/02/22     Allergies:   Codeine  Restrictions/Precautions:   None      Interventions Planned (Treatment may consist of any combination of the following):  Current Treatment Recommendations: Strengthening; ROM; Home exercise program; Manual    See Assessment Note    Subjective Comments:   I have been sick for several weeks but the shoulders are doing ok  Initial Pain Level::     0 /10  Post Session Pain Level:      0  /10  Medications Last Reviewed:  05/12/2023  Updated Objective Findings:  None Today  Treatment   THERAPEUTIC EXERCISE: (40 minutes):    Exercises per grid below to improve mobility and strength.  Required moderate visual, verbal, and manual cues to promote proper body alignment and promote proper body posture.  Progressed resistance and repetitions as indicated. PROM in all directions in supine for stiffness   Date:  04/13/23 Date:  7/29 Date:  05/12/23   Activity/Exercise Parameters Parameters Parameters   Pulleys flex  3 min 1 min 1 min   1 min scaption   Cervical retraction 2 x 10 1 x 10  1 x 10   ER stretch Wand x 5     Y,T,I X 10 each Bilat  Y T prone B 3 x 10   Cervical retr with ext  X 5 10x   rows  Blue band 3 x 10 Black 3 x 10   Shoulder ext  Blue 3 x 10 Black 3 x 10   ER/IR  Blue 2 x 10 each arm    HEP as above    Treatment/Session Summary:    Treatment Assessment:   Continues to do well with ex  Communication/Consultation:  None today  Equipment provided today:  HEP   Recommendations/Intent for next treatment session: Next visit will focus on B shoulder motions and strength.    >Total Treatment Billable Duration:  40 minutes   Time In: 0900  Time Out: 0940    Franchot Gallo, PT         Charge Capture  Events  MedBridge Portal  Appt Desk  Attendance Report     Future Appointments   Date Time Provider Department Center  05/19/2023  9:00 AM Franchot Gallo, PT [facility]   05/22/2023  9:00 AM Franchot Gallo, PT [facility]   05/26/2023  9:00 AM Franchot Gallo, PT [facility]   05/29/2023  9:00 AM Franchot Gallo, PT [facility]   06/06/2023 10:45 AM Allyne Gee, MD [facility] AM[facility]   06/09/2023  9:00 AM Franchot Gallo, PT [facility]

## 2023-05-19 ENCOUNTER — Inpatient Hospital Stay: Admit: 2023-05-19 | Payer: MEDICARE | Primary: Diagnostic Radiology

## 2023-05-19 NOTE — Progress Notes (Signed)
Christopher Gonzalez  DOB: 11/18/1940  Primary: Medicare Part A And B (Medicare)  Secondary: [facility] CARE MEDICARE SUPP [facility] [employer]  [address]  [place] [address]  Phone: [phone]  Fax: [phone] Plan Frequency: 2/week x 8 weeks    Plan of Care/Certification Expiration Date: 06/08/23        Plan of Care/Certification Expiration Date:  Plan of Care/Certification Expiration Date: 06/08/23    Frequency/Duration:   Plan Frequency: 2/week x 8 weeks      Time In/Out:   Time In: 0900  Time Out: 0945      PT Visit Info:    Total # of Visits to Date: 4      Visit Count:  4    OUTPATIENT PHYSICAL THERAPY:   Treatment Note 05/19/2023       Episode  (B shoulder pain)               Treatment Diagnosis:    Chronic right shoulder pain  Chronic left shoulder pain  Stiffness of left shoulder, not elsewhere classified  Stiffness of right shoulder, not elsewhere classified  Medical/Referring Diagnosis:    Left shoulder pain, unspecified chronicity  Right shoulder pain, unspecified chronicity      Referring Physician:  Allyne Gee, MD  MD Orders:  PT Eval and Treat   Return MD Appt:  prn   Date of Onset:  Onset Date: 12/02/22     Allergies:   Codeine  Restrictions/Precautions:   None      Interventions Planned (Treatment may consist of any combination of the following):  Current Treatment Recommendations: Strengthening; ROM; Home exercise program; Manual    See Assessment Note    Subjective Comments:   I have been sick for several weeks but the shoulders are doing ok  Initial Pain Level::     0 /10  Post Session Pain Level:      0  /10  Medications Last Reviewed:  05/19/2023  Updated Objective Findings:  None Today  Treatment   THERAPEUTIC EXERCISE: (45 minutes):    Exercises per grid below to improve mobility and strength.  Required moderate visual, verbal, and manual cues to promote proper body alignment and promote proper body posture.  Progressed resistance and repetitions as  indicated. PROM in all directions in supine for stiffness   Date:  04/13/23 Date:  7/29 Date:  05/12/23 Date  05/19/23   Activity/Exercise Parameters Parameters Parameters    Pulleys flex  3 min 1 min 1 min   1 min scaption 2 min flex  2 min scaption   Cervical retraction 2 x 10 1 x 10 1 x 10 1 x 10   ER stretch Wand x 5      Y,T,I X 10 each Bilat  Y T prone B 3 x 10 Prone 2 x 10   Cervical retr with ext  X 5 10x 10x   rows  Blue band 3 x 10 Black 3 x 10 Black 3 x 10   Shoulder ext  Blue 3 x 10 Black 3 x 10 Black 3 x 10   ER/IR  Blue 2 x 10 each arm  Blue 3 x 10 each side   Dribble small ball against wall    3 positions 10x x 3   Ball drop    Flex 10x x 3  Abd 10x x 3   Thoracic ext    Over chair x 10  HEP as above    Treatment/Session Summary:    Treatment Assessment:   Continues to do well.  Felt sore in scapular area after last visit but dissipated.  Communication/Consultation:  None today  Equipment provided today:  HEP   Recommendations/Intent for next treatment session: Next visit will focus on B shoulder motions and strength.    >Total Treatment Billable Duration:  45 minutes   Time In: 0900  Time Out: 0945    Franchot Gallo, PT         Charge Capture  Events  MedBridge Portal  Appt Desk  Attendance Report     Future Appointments   Date Time Provider Department Center   05/22/2023  9:00 AM Franchot Gallo, PT [facility]   05/26/2023  9:00 AM Franchot Gallo, PT [facility] S[facility]   05/29/2023  9:00 AM Franchot Gallo, PT [facility]   06/06/2023 10:45 AM Allyne Gee, MD [facility] AM[facility]

## 2023-05-22 ENCOUNTER — Inpatient Hospital Stay: Admit: 2023-05-22 | Payer: MEDICARE | Primary: Diagnostic Radiology

## 2023-05-22 NOTE — Progress Notes (Signed)
Christopher Gonzalez  DOB: [birth year]  Primary: Medicare Part A And B (Medicare)  Secondary: [facility] CARE MEDICARE SUPP [facility] [employer]  [address]  [place] [address]  Phone: [phone]  Fax: [phone] Plan Frequency: 2/week x 8 weeks    Plan of Care/Certification Expiration Date: 06/08/23        Plan of Care/Certification Expiration Date:  Plan of Care/Certification Expiration Date: 06/08/23    Frequency/Duration:   Plan Frequency: 2/week x 8 weeks      Time In/Out:   Time In: 0900  Time Out: 0940      PT Visit Info:    Total # of Visits to Date: 5      Visit Count:  5    OUTPATIENT PHYSICAL THERAPY:   Treatment Note 05/22/2023       Episode  (B shoulder pain)               Treatment Diagnosis:    Chronic right shoulder pain  Chronic left shoulder pain  Stiffness of left shoulder, not elsewhere classified  Stiffness of right shoulder, not elsewhere classified  Medical/Referring Diagnosis:    Left shoulder pain, unspecified chronicity  Right shoulder pain, unspecified chronicity      Referring Physician:  Allyne Gee, MD  MD Orders:  PT Eval and Treat   Return MD Appt:  prn   Date of Onset:  Onset Date: 12/02/22     Allergies:   Codeine  Restrictions/Precautions:   None      Interventions Planned (Treatment may consist of any combination of the following):  Current Treatment Recommendations: Strengthening; ROM; Home exercise program; Manual    See Assessment Note    Subjective Comments:   I am having a little stiffness in my low back but shoulders are good.  No pain  Initial Pain Level::     0 /10  Post Session Pain Level:      0  /10  Medications Last Reviewed:  05/22/2023  Updated Objective Findings:  None Today  Treatment   THERAPEUTIC EXERCISE: (40 minutes):    Exercises per grid below to improve mobility and strength.  Required moderate visual, verbal, and manual cues to promote proper body alignment and promote proper body posture.  Progressed resistance and  repetitions as indicated. PROM in all directions in supine for stiffness   Date:  04/13/23 Date:  7/29 Date:  05/12/23 Date  05/19/23 Date  05/22/23   Activity/Exercise Parameters Parameters Parameters     Pulleys flex  3 min 1 min 1 min   1 min scaption 2 min flex  2 min scaption 2 min flex  2 min scaption   Cervical retraction 2 x 10 1 x 10 1 x 10 1 x 10 1 x 10 rec band   ER stretch Wand x 5       Y,T,I X 10 each Bilat  Y T prone B 3 x 10 Prone 2 x 10 Prone 2 x 10   Cervical retr with ext  X 5 10x 10x 10x   rows  Blue band 3 x 10 Black 3 x 10 Black 3 x 10 Black 3 x 10   Shoulder ext  Blue 3 x 10 Black 3 x 10 Black 3 x 10 Black 3 x 10   ER/IR  Blue 2 x 10 each arm  Blue 3 x 10 each side Blue 3 x 10 each side   Dribble small  ball against wall    3 positions 10x x 3    Ball drop    Flex 10x x 3  Abd 10x x 3    Thoracic ext    Over chair x 10 Over chair x 10   HEP as above    Treatment/Session Summary:    Treatment Assessment:   Doing well.  Better quality of motion with prone exercises  Communication/Consultation:  None today  Equipment provided today:  HEP   Recommendations/Intent for next treatment session: Next visit will focus on B shoulder motions and strength.    >Total Treatment Billable Duration:  40 minutes   Time In: 0900  Time Out: 0940    Franchot Gallo, PT         Charge Capture  Events  MedBridge Portal  Appt Desk  Attendance Report     Future Appointments   Date Time Provider Department Center   05/26/2023  9:00 AM Franchot Gallo, PT [facility]   05/29/2023  9:00 AM Franchot Gallo, PT [facility]   06/06/2023 10:45 AM Allyne Gee, MD [facility] AM[facility]

## 2023-05-26 ENCOUNTER — Inpatient Hospital Stay: Admit: 2023-05-26 | Payer: MEDICARE | Primary: Diagnostic Radiology

## 2023-05-26 NOTE — Progress Notes (Signed)
Christopher Gonzalez  DOB: [birth year]  Primary: Medicare Part A And B (Medicare)  Secondary: [facility] CARE MEDICARE SUPP [facility] [employer]  [address] [place]  [place] [address]  Phone: [phone]  Fax: [phone] Plan Frequency: 2/week x 8 weeks    Plan of Care/Certification Expiration Date: 06/08/23        Plan of Care/Certification Expiration Date:  Plan of Care/Certification Expiration Date: 06/08/23    Frequency/Duration:   Plan Frequency: 2/week x 8 weeks      Time In/Out:   Time In: 0900  Time Out: 0945      PT Visit Info:    Total # of Visits to Date: 6      Visit Count:  6    OUTPATIENT PHYSICAL THERAPY:   Treatment Note 05/26/2023       Episode  (B shoulder pain)               Treatment Diagnosis:    Chronic right shoulder pain  Chronic left shoulder pain  Stiffness of left shoulder, not elsewhere classified  Stiffness of right shoulder, not elsewhere classified  Medical/Referring Diagnosis:    Left shoulder pain, unspecified chronicity  Right shoulder pain, unspecified chronicity      Referring Physician:  Allyne Gee, MD  MD Orders:  PT Eval and Treat   Return MD Appt:  prn   Date of Onset:  Onset Date: 12/02/22     Allergies:   Codeine  Restrictions/Precautions:   None      Interventions Planned (Treatment may consist of any combination of the following):  Current Treatment Recommendations: Strengthening; ROM; Home exercise program; Manual    See Assessment Note    Subjective Comments:   I am doing well.  I don't feel as stiff with all of my driving either.  I have had no pain.  Initial Pain Level::     0 /10  Post Session Pain Level:      0  /10  Medications Last Reviewed:  05/26/2023  Updated Objective Findings:  None Today  Treatment   THERAPEUTIC EXERCISE: (45 minutes):    Exercises per grid below to improve mobility and strength.  Required moderate visual, verbal, and manual cues to promote proper body alignment and promote proper body posture.  Progressed  resistance and repetitions as indicated. PROM in all directions in supine for stiffness   Date:  04/13/23 Date:  7/29 Date:  05/12/23 Date  05/19/23 Date  05/22/23 Date  05[birth year]/21   Activity/Exercise Parameters Parameters Parameters      Pulleys flex  3 min 1 min 1 min   1 min scaption 2 min flex  2 min scaption 2 min flex  2 min scaption  2 min flex  2 min scaption   Cervical retraction 2 x 10 1 x 10 1 x 10 1 x 10 1 x 10 rec band 1 x 10   ER stretch Wand x 5        Y,T,I X 10 each Bilat  Y T prone B 3 x 10 Prone 2 x 10 Prone 2 x 10    Cervical retr with ext  X 5 10x 10x 10x    rows  Blue band 3 x 10 Black 3 x 10 Black 3 x 10 Black 3 x 10 Black 3 x 10   Shoulder ext  Blue 3 x 10 Black 3 x 10 Black 3 x 10 Black 3 x 10  Black 3 x 10   ER/IR  Blue 2 x 10 each arm  Blue 3 x 10 each side Blue 3 x 10 each side Blue 3 x 10    Dribble small ball against wall    3 positions 10x x 3  Weighted ball 3 positions 3 x 10   Ball drop    Flex 10x x 3  Abd 10x x 3     Thoracic ext    Over chair x 10 Over chair x 10 Over chair x 10   Shouilder press      3# 2 x 10   Flexion to 90      3# 2 x 10   HEP as above    Treatment/Session Summary:    Treatment Assessment:                 Did well with all ex  Communication/Consultation:  None today  Equipment provided today:  HEP   Recommendations/Intent for next treatment session: D/C    >Total Treatment Billable Duration:  45 minutes   Time In: 0900  Time Out: 0945    Franchot Gallo, PT         Charge Capture  Events  MedBridge Portal  Appt Desk  Attendance Report     Future Appointments   Date Time Provider Department Center   05/29/2023  9:00 AM Franchot Gallo, PT [facility]   06/06/2023 10:45 AM Allyne Gee, MD [facility] AMB

## 2023-05-26 NOTE — Discharge Instructions (Signed)
Christopher Gonzalez  DOB: 06/22/1941  Primary: Medicare Part A And B (Medicare)  Secondary: [facility] MEDICARE SUPP [facility] [employer] [facility]  [address] [place]  [place] [address]  Phone: [phone]  Fax: [phone] Plan Frequency: 2/week x 8 weeks    Plan of Care/Certification Expiration Date: 06/08/23        Plan of Care/Certification Expiration Date:  Plan of Care/Certification Expiration Date: 06/08/23    Frequency/Duration: Plan Frequency: 2/week x 8 weeks      Time In/Out:   Time In: 0900  Time Out: 0945      PT Visit Info:    Total # of Visits to Date: 6      Visit Count:  6                OUTPATIENT PHYSICAL THERAPY:             Discharge Summary 05/26/2023               Episode (B shoulder pain)         Treatment Diagnosis:     No data found  Medical/Referring Diagnosis:    Left shoulder pain, unspecified chronicity  Right shoulder pain, unspecified chronicity      Referring Physician:  Allyne Gee, MD  MD Orders:  PT Eval and Treat   Return MD Appt:  prn  Date of Onset:  Onset Date: 12/02/22     Allergies:  Codeine  Restrictions/Precautions:    None      Medications Last Reviewed:  05/26/2023     SUBJECTIVE   History of Injury/Illness (Reason for Referral):  Pt presents with B shoulder pain but it is much better after injections about 3 weeks ago.  Pt states he is pain free after injections. He has a history of radiating pain from shoulder to 3rd finger on L. About a year ago he had PT for that pain and it is gone.  Retired but [employer]'s [employer] and plays golf monthly but also goes to driving range 2-3 weekly and spends time with grandkids.  He does workout some at home.  Discharge- Pt seen for 6 visits for mobility of cervical/thoracic spine and shoulder mobility and strength.  Pt has me all goals and is independent with HEP.  He will be discharged today.  Patient Stated Goal(s):  "shoulder strength"         OBJECTIVE   Pain- no pain currently  ROM-cervical flex- 90%                          Ext- 70%                         Rot- L 70%  R 90%  L shoulder - PROM- flex- 170                                   ER at 90 abd- 85                                   IR at 90 60  R shoulder PROM flex- 170  ER at 90 - 75                                 IR at 90 60  Strength- ER- L4/5  r 4+/5                  IR  L 4+/5  Non painful with resistance    ASSESSMENT    Assessment:  Pt with good progress for return to function. He has had no pain for several weeks and has returned to his daily activities.  Will D/C    Therapy Problem List: (Impacting functional limitations):    Body Structures, Functions, Activity Limitations Requiring Skilled Therapeutic Intervention: Decreased strength; Decreased functional mobility ; Increased pain; Decreased ROM    Therapy Prognosis:     Therapy Prognosis: Good    Initial Assessment Complexity:     Decision Making: Medium Complexity      PLAN         GOALS: (Goals have been discussed and agreed upon with patient.)  Short-Term Functional Goals: Time Frame: 3 weeks  Report no more than 3/10 intermittent pain to B shoulders with compensatory use during basic functional activities.  MET  B shoulders PROM forward elevation greater than 160 degrees and external rotation greater than 80 degrees to progress into functional ranges.  MET  Demonstrate good B shoulders isometric strength with manual testing to progress into strength phase.MET  Independent with initial HEP.MET  Discharge Goals: Time Frame: 8 weeks  Demonstrate good functional shoulder strength and endurance for return to normalized household and work activities.   MET   Independent with advanced shoulder HEP for continued self-management. MET           Outcome Measure:   Tool Used: Disabilities of the Arm, Shoulder and Hand (DASH) Questionnaire - Quick Version  Score:  Initial: 8/55  Most Recent: X/55 (Date: -- )   Interpretation of Score: The DASH is designed to measure the  activities of daily living in person's with upper extremity dysfunction or pain.  Each section is scored on a 1-5 scale, 5 representing the greatest disability.  The scores of each section are added together for a total score of 55.      Medical Necessity:   > Skilled intervention continues to be required due to B shoulder pain and weakness.  Reason For Services/Other Comments:  > Patient continues to require skilled intervention due to goals above not met.           Charge Capture  Events  Appt Desk  Attendance Report

## 2023-05-29 ENCOUNTER — Encounter: Payer: MEDICARE | Primary: Diagnostic Radiology

## 2023-06-02 ENCOUNTER — Encounter: Payer: Medicare Other | Primary: Internal Medicine

## 2023-06-06 ENCOUNTER — Encounter: Payer: MEDICARE | Attending: Orthopaedic Surgery | Primary: Internal Medicine

## 2023-06-09 ENCOUNTER — Encounter: Payer: Medicare Other | Primary: Internal Medicine

## 2023-08-12 ENCOUNTER — Encounter

## 2023-08-14 MED ORDER — ATORVASTATIN CALCIUM 20 MG PO TABS
20 | ORAL_TABLET | Freq: Every day | ORAL | 3 refills | Status: AC
Start: 2023-08-14 — End: ?

## 2023-08-14 NOTE — Telephone Encounter (Signed)
Requested Prescriptions     Pending Prescriptions Disp Refills    atorvastatin (LIPITOR) 20 MG tablet [Pharmacy Med Name: Atorvastatin Calcium Oral Tablet 20 MG] 90 tablet 3     Sig: TAKE 1 TABLET EVERY DAY    Verified rx. Last OV 11/17/22. Erx to pharm on file.

## 2023-10-15 ENCOUNTER — Encounter

## 2023-10-26 ENCOUNTER — Encounter

## 2023-10-27 ENCOUNTER — Inpatient Hospital Stay: Admit: 2023-10-27 | Payer: MEDICARE | Primary: Diagnostic Radiology

## 2023-10-27 ENCOUNTER — Encounter

## 2023-10-27 DIAGNOSIS — C61 Malignant neoplasm of prostate: Secondary | ICD-10-CM

## 2023-10-27 LAB — PSA, DIAGNOSTIC: PSA: 2.3 ng/mL (ref 0.0–4.0)

## 2023-11-06 ENCOUNTER — Ambulatory Visit: Admit: 2023-11-06 | Discharge: 2023-11-06 | Payer: MEDICARE | Attending: Urology | Primary: Diagnostic Radiology

## 2023-11-06 MED ORDER — TADALAFIL 20 MG PO TABS
20 | ORAL_TABLET | ORAL | 11 refills | Status: AC | PRN
Start: 2023-11-06 — End: ?

## 2023-11-06 MED ORDER — TAMSULOSIN HCL 0.4 MG PO CAPS
0.4 | ORAL_CAPSULE | Freq: Every day | ORAL | 3 refills | Status: AC
Start: 2023-11-06 — End: ?

## 2023-11-06 MED ORDER — DUTASTERIDE 0.5 MG PO CAPS
0.5 | ORAL_CAPSULE | Freq: Every day | ORAL | 3 refills | Status: AC
Start: 2023-11-06 — End: ?

## 2023-11-06 NOTE — Patient Instructions (Addendum)
Patient Information from Today's Visit    The members of your [facility]cology [facility] are listed below:    Physician Provider: Karn Pickler, Urologic Oncologist  Advanced Practice Clinician: Blinda Leatherwood, PA  Nurse Navigator: N/A  Medical Assistant: Margy Clarks., CMA  Scheduler:Rachel N.  Supportive Care Services: Harvie Bridge., LMSW    Diagnosis: Prostate    Follow Up Instructions:   Labs reviewed  We will refill your Flomax, Cialis and Dutasteride.  DRE performed   Discussion of active surveillance.  We will plan to see you back in 6 months. If you need anything prior please do not hesitate to call our office.  Treatment Summary has been discussed and given to patient:N/A      Current Labs:   No visits with results within 2 Day(s) from this visit.   Latest known visit with results is:   Hospital Outpatient Visit on 10/27/2023   Component Date Value Ref Range Status    PSA 10/27/2023 2.3  0.0 - 4.0 ng/mL Final    Comment: Roche ECLIA methodology  Patient's results of tumor marker testing may not be comparable to labs using different manufacturers/methods.                 Please refer to After Visit Summary or MyChart for upcoming appointment information. Please call our office for rescheduling needs at least 24 hours before your scheduled appointment time.If you have any questions regarding your upcoming schedule please reach out to your care team through MyChart or call [phone].     Please notify your assigned Nurse Navigator of any unplanned hospital admissions or Emergency Room visits within 24 hours of discharge.    -------------------------------------------------------------------------------------------------------------------  Please call our office at [phone] if you have any  of the following symptoms:   Fever of 100.5 or greater  Chills  Shortness of breath  Swelling or pain in one leg    After office hours an answering service is available and will contact a provider for emergencies or if you  are experiencing any of the above symptoms.        Leone Brand, MA

## 2023-11-06 NOTE — Progress Notes (Signed)
Urologic Oncology  [facility]  [address]  [place], [place] [address]  [phone]        [name] is a 83 y.o. 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 On Dutasteride and active surveillance.     INTERVAL HISTORY: Patient is here today for follow-up of his prostate cancer and BPH.  He remains on Flomax and dutasteride.  He states his lower urinary tract symptoms are as good as they have been.  He empties easily.  He denies any hematuria or dysuria.    He was diagnosed with Gleason 3+3 equal 6 prostate cancer in April 2018 and has been on surveillance since then.  His PSA has been as high as 3.9.  It was rechecked in July and was down to 3.4.  On 10/27/2023 it is down to 2.3.    His last prostate MRI was 12/26/2022 and showed a gland measuring 102 cc with a PSA density of around 0.06.  His MRI showed what was thought to be a large PI-RADS 5 lesion and a PI-RADS 4 lesion.  There was also mention of a 9 mm right pelvic lymph node.  I suspect the air in the prostate that was called a PI-RADS 5 lesion was actually a BPH nodule.    Patient states he needs refills on his Flomax dutasteride and tadalafil.      From previous note on 12/30/22:[name] returns today for follow-up evaluation of his 3+3=6 prostate cancer diagnosed on biopsy April 2018.  He continues on Flomax and dutasteride with control of his urinary symptoms.     Patient returns today to review his MRI pelvis with and without contrast which was completed 12/26/2022.  His prostate measures approximately 6.5 x 6.0 x 5.0 cm for a volume of approximately 102 cc and a density of approx 0.06 accounting for a PSA of 3.3 on 09/23/22 (~6.6 on Dutasteride). He had a PI-RADS lesion in the right TZ and a PI-RADS 4 lesion in the right mid-gland TZ.     His PSA is up further to 3.9 today.    Past medical, family and social histories, as well as medications and  allergies, were reviewed and updated in the medical record as appropriate.    PMH:     Past Medical History:   Diagnosis Date    Elevated PSA     Erectile dysfunction     Hypercholesterolemia     Personal history of prostate cancer     Prostate cancer (HCC)        MEDs:     amLODIPine  aspirin  atorvastatin  BIOTIN PO  cyanocobalamin  dutasteride  meclizine  tadalafil  tamsulosin     ALLERGIES:    Allergies   Allergen Reactions    Codeine Nausea And Vomiting       ROS:     Review of Systems   Constitutional:  Negative for chills, fatigue and fever.   Respiratory: Negative.     Cardiovascular: Negative.    Gastrointestinal: Negative.    Genitourinary:  Negative for difficulty urinating, dysuria, flank pain, frequency and hematuria.   Musculoskeletal: Negative.         PHYSICAL EXAMINATION    Ht 1.651 m (5\' 5" )   Wt 77.7 kg (171 lb 6.4 oz)   BMI 28.52 kg/m   General: well dressed, well nourished, no acute distress  Skin: no rashes  HEENT:  Sclera are clear,normocephalic, atraumatic. no external lesions   Cardiovascular: Reg. Normal perfusion  Respiratory: normal respiratory effort, no JVD, no audible wheezing.  Musculoskeletal: unremarkable with normal function. No embolic signs or cyanosis.   Neurologic exam: intact, no focal deficits, moves all 4 extremities  Psych: normal mood and affect, alert, oriented x 3  LE:  no edema  GI: soft, nontender, no masses, no CVA tenderness  GU: Circumcised phallus without mass or lesion; testes bilaterally descended without mass or nodule; no inguinal hernia; no pelvic adenopathy  DRE- 80+ cc gland; smooth, no nodules or induration        LABORATORY RESULTS:    Lab Results   Component Value Date/Time    PSA 2.3 10/27/2023 11:00 AM    PSA 3.4 05/01/2023 07:49 AM    PSA 3.9 12/30/2022 07:30 AM          IMAGING:      MRI Results:    === 12/26/22 ===    MRI PROSTATE W WO CONTRAST    - Narrative -  EXAMINATION: MRI PROSTATE W WO CONTRAST 12/26/2022 5:21 PM    ACCESSION NUMBER:  [id number]    COMPARISON: MRI of June 15, 2019    INDICATION: Malignant neoplasm of prostate    TECHNIQUE:  Multiplanar, multisequence MR imaging was performed of the prostate  prior to and following gadolinium contrast. 15 mL Prohance contrast material was  administrated intravenously for the examination. This study was acquired  following the IV administration of contrast material, given the patient's  indications for the examination. If IV contrast material had not been  administered, the likely of detecting abnormalities relevant to the patient's  condition would have been substantially decreased.  All lesions assessed are  based on PI-RAD compliance.    FINDINGS:  The prostate measures 6.5 x 6.0 x 5.0 cm giving a volume of 102 cc.    BPH: No significant BPH.    Hemorrhage: None.    Peripheral Zone: No suspicious peripheral zone lesion. No evidence of  prostatitis.    Transition/Central Zone: 2 suspicious transition zone lesions are demonstrated  as below.    Lesion # 1  -Size and location: 4.35 x 2.0 cm: Transitional zone lesion which has  significantly enlarged and extended into the bladder. This lesion has several  hypointense foci within it which show diffusion restriction and abnormal  enhancement concerning for malignancy. This is seen in the TZ anterior at the  base  -DWI/ADC: Positive  -DCE: Positive  -Prostate Margin: Intact  -PI-RADS Category: PI-RADS 5    Lesion # 2  -Size and location: Less than 1 cm transitional zone anterior lesion in the mid  prostate on the left which shows diffusion restriction and enhancement.  -DWI/ADC: Positive  -DCE: Positive  -Prostate Margin: Intact  -PI-RADS Category: PI-RADS 4    Neurovascular Bundles: Intact.    Seminal Vesicles: The seminal vesicles are unremarkable.    Lymph Nodes: Note is made of a round 9 mm lymph node on the right which shows  diffusion restriction and enhancement. This is enlarged significantly when  compared with 2020..    - Impression  -  1.  PI-RADS 5 lesion in the transitional zone anteriorly at the base on the  right.  2.  PI-RADS 4 lesion on the left in the anterior transitional zone at the mid  prostate.  3.  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  ASSESSMENT:    [name] is a 83 y.o. 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 On Dutasteride and active surveillance.     We discussed options moving forward including a repeat biopsy.  He and are both in agreement to continue with surveillance.  Even though he is quite healthy for his age, he wants to avoid another biopsy if possible.    I will see him back in 6 months with repeat PSA.  We will refill his Flomax, dutasteride, and tadalafil.      PLAN:   -Labs reviewed PSA  -Flomax, Tadalafil, and Dutasteride refilled  -DRE performed   -Discussion of active surveillance   -RTC in 6 months w/ labs prior PSA  ________________________________________      I have seen and examined this patient.  I have reviewed and edited the note started by the MA and agree with the outlined plan.  Part of this note was written by using a voice dictation software. The note has been proof read but may still contain some grammatical/other typographical errors.      Jackelyn Knife, MD  Urologic Oncology  [facility]  [facility]

## 2023-11-27 ENCOUNTER — Ambulatory Visit
Admit: 2023-11-27 | Discharge: 2023-11-27 | Payer: MEDICARE | Attending: Cardiovascular Disease | Primary: Internal Medicine

## 2023-11-27 VITALS — BP 138/72 | HR 61 | Ht 65.0 in | Wt 170.0 lb

## 2023-11-27 DIAGNOSIS — R931 Abnormal findings on diagnostic imaging of heart and coronary circulation: Secondary | ICD-10-CM

## 2023-11-27 MED ORDER — AMLODIPINE BESYLATE 2.5 MG PO TABS
2.5 | ORAL_TABLET | Freq: Every day | ORAL | 1 refills | Status: AC
Start: 2023-11-27 — End: ?

## 2023-11-27 NOTE — Progress Notes (Signed)
 [facility]  [address], [address]  [place], [place] [address]  PHONE: [phone]        11/27/23        NAME:  Christopher Gonzalez  DOB: October 15, 1940  MRN: [id number]     Elevated CACS  Dyslipidemia    CHIEF COMPLAINT:    Hypertension, PVCs, and Annual Exam      SUBJECTIVE:     No chest pain or palpitation or dizziness. Good year.       Medications were all reviewed with the patient today and updated as necessary.   Current Outpatient Medications   Medication Sig    amLODIPine (NORVASC) 2.5 MG tablet Take 1 tablet by mouth daily    dutasteride (AVODART) 0.5 MG capsule Take 1 capsule by mouth daily    tadalafil (CIALIS) 20 MG tablet Take 1 tablet by mouth as needed for Erectile Dysfunction for erectile dysfunction    tamsulosin (FLOMAX) 0.4 MG capsule Take 2 capsules by mouth daily    atorvastatin (LIPITOR) 20 MG tablet TAKE 1 TABLET EVERY DAY    tadalafil (CIALIS) 20 MG tablet Take 1 tablet by mouth daily as needed for Erectile Dysfunction    BIOTIN PO Take 5,000 mcg by mouth daily    aspirin 81 MG EC tablet Take 1 tablet by mouth daily    cyanocobalamin 100 MCG tablet Take 1 tablet by mouth daily    meclizine (ANTIVERT) 12.5 MG tablet Take by mouth as needed PRN     No current facility-administered medications for this visit.        Allergies   Allergen Reactions    Codeine Nausea And Vomiting           PHYSICAL EXAM:     Wt Readings from Last 3 Encounters:   11/27/23 77.1 kg (170 lb)   11/06/23 77.7 kg (171 lb 6.4 oz)   12/30/22 80.7 kg (178 lb)     BP Readings from Last 3 Encounters:   11/27/23 138/72   11/06/23 (!) 146/75   12/30/22 (!) 171/70       BP 138/72   Pulse 61   Ht 1.651 m (5\' 5" )   Wt 77.1 kg (170 lb)   BMI 28.29 kg/m     Physical Exam  Vitals reviewed.   HENT:      Head: Normocephalic and atraumatic.   Eyes:      Extraocular Movements: Extraocular movements intact.      Pupils: Pupils are equal, round, and reactive to light.   Cardiovascular:      Rate and Rhythm: Normal rate.       Heart sounds: Normal heart sounds.   Pulmonary:      Effort: Pulmonary effort is normal.      Breath sounds: Normal breath sounds.   Abdominal:      General: Abdomen is flat.      Palpations: Abdomen is soft. There is no mass.   Musculoskeletal:         General: Normal range of motion.      Cervical back: Normal range of motion.   Skin:     General: Skin is warm and dry.   Neurological:      General: No focal deficit present.      Mental Status: He is alert and oriented to person, place, and time.   Psychiatric:         Mood and Affect: Mood normal.  RECENT LABS AND RECORDS REVIEW    On 03/2022: t chol 114, hdl 60, ldl 40.6    No results found for any visits on 11/27/23.   ASSESSMENT and PLAN    Christopher Gonzalez was seen today for coronary artery disease.    Diagnoses and all orders for this visit:    Hyperlipidemia  Doing well. Continue current rx.  Agatston coronary artery calcium score greater than 400  Doing well. Continue current rx.  Htn  Resume amlodipine 2.5    Return in about 1 year (around 11/26/2024).       Christopher Messier, MD  11/27/2023  9:27 AM

## 2023-12-27 ENCOUNTER — Encounter: Payer: MEDICARE | Attending: Internal Medicine | Primary: Internal Medicine

## 2023-12-29 ENCOUNTER — Ambulatory Visit: Admit: 2023-12-29 | Discharge: 2023-12-29 | Payer: MEDICARE | Attending: Internal Medicine | Primary: Internal Medicine

## 2023-12-29 ENCOUNTER — Other Ambulatory Visit: Admit: 2023-12-29 | Discharge: 2023-12-29 | Payer: MEDICARE | Primary: Internal Medicine

## 2023-12-29 DIAGNOSIS — I10 Essential (primary) hypertension: Secondary | ICD-10-CM

## 2023-12-29 NOTE — Assessment & Plan Note (Signed)
 Chronic, at goal (stable), continue current treatment plan

## 2023-12-29 NOTE — Progress Notes (Signed)
 [facility]  [address]  [place], [place] [address]  Phone: [phone]  Fax: [phone]      Problem Based Overview with Integrated Assessment and Plan    Christopher Gonzalez is a 83 y.o. year old male who presents to establish care. He was referred by his cardiologist, who suggested he establish care with a PCP. Christopher Gonzalez reports no specific problems or concerns at this time.    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, including flexibility and [employer] a couple of miles when weather permits  Golfs occasionally  Has two grandchildren, ages 33 and 67  Family History:  Biological father passed away from a stroke before age 59  Mother passed away with Alzheimer's at around age 34  Review of Systems:  Urinary: Sometimes gets up twice a night to urinate, sometimes once, sometimes not at all for 2-3 nights  Weight: Lost about 15 pounds in the last 6 months through diet changes  Diet: Eats mostly fish, lean beef, chicken, vegetables, and protein drinks; avoids sugar    Objective  Vital Signs:  Weight: 167 lbs (patient reports 160-163 lbs at home)  Physical Examination:  Cardiovascular: Normal heart sounds on auscultation  Respiratory: Clear breath sounds on auscultation  Diagnostic Results:  Last cholesterol panel (2023): LDL 40, overall panel described as "really good"  Last PSA: 2.3 (down from previous high of 14)    Assessment  Hypertension (I10)  Currently controlled on amlodipine  Patient reports good blood pressure  control  Prostate Cancer (C61)  Non-aggressive  Followed by oncologist (Christopher Gonzalez)  PSA decreased from 14 to 2.3  On Tamsulosin and Dutasteride for management  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)  Managed with Tamsulosin and Dutasteride  Patient reports improvement in urinary symptoms  Erectile Dysfunction (N52.9)  Managed with Cialis  Neck Degenerative Disc Disease (M50.30)  History of associated shoulder issues  Improved with therapy and regular exercises  Weight Management  Patient reports 15-pound weight loss over 6 months  Current weight: 167 lbs (office scale), 160-163 lbs (home scale)  Cardiovascular Risk Assessment  History of elevated calcium score  Normal stress test in 2019  No current chest pain reported    Plan  Hypertension Management:  Continue current medication (amlodipine)  Encourage continued home blood pressure monitoring  Prostate Cancer and BPH Management:  Continue current medications (Tamsulosin, Dutasteride)  Follow up with oncologist (Christopher Gonzalez) as scheduled  Erectile Dysfunction:  Continue Cialis as prescribed  Preventive Care:  Order basic lab work: metabolic panel and complete blood count  Review PSA results from January  Weight Management and Lifestyle:  Encourage continuation of current diet and exercise regimen  Reinforce importance of muscle-building exercises for weight loss  Cardiovascular Health:  No immediate need for stress test given normal results in 2019 and absence of symptoms  Nutritional Supplementation:  Recommend vitamin B complex to potentially help reduce risk for dementia  Follow-up:  Schedule follow-up visit in 6 months  Consider making next visit an Annual Wellness Visit (covered  by Medicare)  Patient Education:  Discuss the role of insulin in metabolism and weight management  Reinforce the importance of balanced nutrition and regular exercise    I have ordered basic lab work including a metabolic panel and complete blood count. We will review these results  and follow up if any concerns arise. Continue your current medications and lifestyle modifications. If you have any new or worsening symptoms before our next visit, please contact the office.    1. Primary hypertension  Overview:  BP: 137/60     Assessment & Plan:   Chronic, at goal (stable), continue current treatment plan  Orders:  -     Comprehensive Metabolic Panel; Future  -     CBC with Auto Differential; Future  -     Lipid Panel; Future  -     TSH reflex to FT4; Future  -     Urinalysis; Future  2. Hyperlipidemia, unspecified hyperlipidemia type  Overview:  Lab Results   Component Value Date    CHOL 114 03/31/2022    TRIG 67 03/31/2022    HDL 60 03/31/2022    LDL 40.6 03/31/2022    VLDL 13.4 03/31/2022    CHOLHDLRATIO 1.9 03/31/2022       Assessment & Plan:   Chronic, at goal (stable), continue current treatment plan  3. DDD (degenerative disc disease), cervical  4. Agatston coronary artery calcium score greater than 400  Overview:  939       2019      5. Prostate cancer (HCC)  6. BPH associated with nocturia         Current Outpatient Medications   Medication Instructions    amLODIPine (NORVASC) 2.5 mg, Oral, DAILY    aspirin 81 mg, DAILY    atorvastatin (LIPITOR) 20 mg, Oral, DAILY    BIOTIN PO 5,000 mcg, DAILY    cyanocobalamin 100 mcg, DAILY    dutasteride (AVODART) 0.5 mg, Oral, DAILY    meclizine (ANTIVERT) 12.5 MG tablet PRN    tadalafil (CIALIS) 20 mg, Oral, DAILY PRN    tadalafil (CIALIS) 20 mg, Oral, PRN, for erectile dysfunction    tamsulosin (FLOMAX) 0.8 mg, Oral, DAILY     Vitals:    12/29/23 1107 12/29/23 1108   BP: (!) 141/58 137/60   BP Site: Right Upper Arm Left Upper Arm   Patient Position: Sitting Sitting   BP Cuff Size: Large Adult Large Adult   Pulse: 64    Temp: 97.2 F (36.2 C)    TempSrc: Temporal    SpO2: 97%    Weight: 75.9 kg (167 lb 6.4 oz)    Height: 1.651 m (5\' 5" )      BP Readings from Last 3 Encounters:   12/29/23 137/60   11/27/23 138/72   11/06/23 (!) 146/75     Body mass  index is 27.86 kg/m.  Wt Readings from Last 3 Encounters:   12/29/23 75.9 kg (167 lb 6.4 oz)   11/27/23 77.1 kg (170 lb)   11/06/23 77.7 kg (171 lb 6.4 oz)         12/29/2023    11:06 AM   PHQ-9    Little interest or pleasure in doing things 0   Feeling down, depressed, or hopeless 0   PHQ-2 Score 0   PHQ-9 Total Score 0      Physical Exam  Constitutional:       Appearance: Normal appearance.   HENT:      Head:  Normocephalic and atraumatic.      Right Ear: Tympanic membrane normal.      Left Ear: Tympanic membrane normal.   Eyes:      Extraocular Movements: Extraocular movements intact.      Pupils: Pupils are equal, round, and reactive to light.   Cardiovascular:      Rate and Rhythm: Normal rate and regular rhythm.   Pulmonary:      Effort: Pulmonary effort is normal.   Abdominal:      General: There is no distension.      Palpations: Abdomen is soft.   Musculoskeletal:         General: No swelling or tenderness.   Skin:     General: Skin is warm.   Neurological:      General: No focal deficit present.      Mental Status: He is alert. Mental status is at baseline.   Psychiatric:         Mood and Affect: Mood normal.         Behavior: Behavior normal.           Lab Results   Component Value Date    NA 142 11/23/2022    K 3.9 11/23/2022    CL 110 11/23/2022    CO2 29 11/23/2022    BUN 20 11/23/2022    CREATININE 0.90 11/23/2022    GLUCOSE 108 (H) 11/23/2022    CALCIUM 9.4 11/23/2022    BILITOT 0.7 11/23/2022    ALKPHOS 124 11/23/2022    AST 17 11/23/2022    ALT 20 11/23/2022    LABGLOM >60 11/23/2022    GFRAA >60 01/26/2021    AGRATIO 1.4 01/26/2021    GLOB 3.3 11/23/2022     Lab Results   Component Value Date    WBC 7.1 11/23/2022    HGB 14.0 11/23/2022    HCT 43.2 11/23/2022    MCV 88.2 11/23/2022    PLT 145 (L) 11/23/2022    LYMPHOPCT 20 11/23/2022    RBC 4.90 11/23/2022    MCH 28.6 11/23/2022    MCHC 32.4 11/23/2022    RDW 13.7 11/23/2022     Lab Results   Component Value Date    CHOL 114 03/31/2022    TRIG 67  03/31/2022    HDL 60 03/31/2022    LDL 40.6 03/31/2022    VLDL 13.4 03/31/2022    CHOLHDLRATIO 1.9 03/31/2022     Hemoglobin A1C   Date Value Ref Range Status   01/26/2021 5.5 4.20 - 6.30 % Final   05/22/2018 5.7 (H) 4.8 - 5.6 % Final     Comment:              Prediabetes: 5.7 - 6.4           Diabetes: >6.4           Glycemic control for adults with diabetes: <7.0       Lab Results   Component Value Date    TSH 1.430 03/31/2022         Return in about 6 months (around 06/30/2024) for awv.    -- Darien Ramus, MD     The patient consented verbally to the use of ambient scribing software to assist with the documentation for this visit.

## 2023-12-30 LAB — URINALYSIS
BACTERIA, URINE: NEGATIVE /HPF
Bilirubin, Urine: NEGATIVE
Blood, Urine: NEGATIVE
Glucose, Ur: NEGATIVE mg/dL
Ketones, Urine: NEGATIVE mg/dL
Nitrite, Urine: NEGATIVE
Protein, UA: NEGATIVE mg/dL
Specific Gravity, UA: 1.016 (ref 1.001–1.023)
Urobilinogen, Urine: 0.2 EU/dL (ref 0.2–1.0)
pH, Urine: 5.5 (ref 5.0–9.0)

## 2023-12-30 LAB — LIPID PANEL
Chol/HDL Ratio: 2 (ref 0.0–5.0)
Cholesterol, Total: 139 mg/dL (ref 0–200)
HDL: 70 mg/dL — ABNORMAL HIGH (ref 40–60)
LDL Cholesterol: 61 mg/dL (ref 0–100)
Triglycerides: 38 mg/dL (ref 0–150)
VLDL Cholesterol Calculated: 8 mg/dL (ref 6–23)

## 2023-12-30 LAB — COMPREHENSIVE METABOLIC PANEL
ALT: 22 U/L (ref 8–55)
AST: 32 U/L (ref 15–37)
Albumin/Globulin Ratio: 1.3 (ref 1.0–1.9)
Albumin: 4 g/dL (ref 3.2–4.6)
Alk Phosphatase: 108 U/L (ref 40–129)
Anion Gap: 9 mmol/L (ref 7–16)
BUN: 16 mg/dL (ref 8–23)
CO2: 26 mmol/L (ref 20–29)
Calcium: 9.2 mg/dL (ref 8.8–10.2)
Chloride: 106 mmol/L (ref 98–107)
Creatinine: 1.1 mg/dL (ref 0.80–1.30)
Est, Glom Filt Rate: 67 mL/min/{1.73_m2} (ref >60)
Globulin: 3 g/dL (ref 2.3–3.5)
Glucose: 92 mg/dL (ref 70–99)
Potassium: 4.2 mmol/L (ref 3.5–5.1)
Sodium: 141 mmol/L (ref 136–145)
Total Bilirubin: 0.6 mg/dL (ref 0.0–1.2)
Total Protein: 7 g/dL (ref 6.3–8.2)

## 2023-12-30 LAB — CBC WITH AUTO DIFFERENTIAL
Basophils %: 0.8 % (ref 0.0–2.0)
Basophils Absolute: 0.05 10*3/uL (ref 0.00–0.20)
Eosinophils %: 4.1 % (ref 0.5–7.8)
Eosinophils Absolute: 0.27 10*3/uL (ref 0.00–0.80)
Hematocrit: 42.9 % (ref 41.1–50.3)
Hemoglobin: 13.9 g/dL (ref 13.6–17.2)
Immature Granulocytes %: 0.5 % (ref 0.0–5.0)
Immature Granulocytes Absolute: 0.03 10*3/uL (ref 0.0–0.5)
Lymphocytes %: 27.3 % (ref 13.0–44.0)
Lymphocytes Absolute: 1.79 10*3/uL (ref 0.50–4.60)
MCH: 29.1 pg (ref 26.1–32.9)
MCHC: 32.4 g/dL (ref 31.4–35.0)
MCV: 89.9 FL (ref 82–102)
MPV: 10.4 FL (ref 9.4–12.3)
Monocytes %: 10.4 % (ref 4.0–12.0)
Monocytes Absolute: 0.68 10*3/uL (ref 0.10–1.30)
Neutrophils %: 56.9 % (ref 43.0–78.0)
Neutrophils Absolute: 3.73 10*3/uL (ref 1.70–8.20)
Platelets: 157 10*3/uL (ref 150–450)
RBC: 4.77 M/uL (ref 4.23–5.6)
RDW: 13.7 % (ref 11.9–14.6)
WBC: 6.6 10*3/uL (ref 4.3–11.1)
nRBC: 0 10*3/uL (ref 0.0–0.2)

## 2023-12-30 LAB — TSH REFLEX TO FT4: TSH w Free Thyroid if Abnormal: 1.23 u[IU]/mL (ref 0.27–4.20)

## 2024-04-15 NOTE — Telephone Encounter (Signed)
 Requested Prescriptions     Pending Prescriptions Disp Refills    amLODIPine  (NORVASC ) 2.5 MG tablet [Pharmacy Med Name: amLODIPine  Besylate Oral Tablet 2.5 MG] 90 tablet 3     Sig: TAKE 1 TABLET EVERY DAY     Verified rx. Last OV 11/27/23. Erx to pharm on file.

## 2024-04-19 MED ORDER — AMLODIPINE BESYLATE 2.5 MG PO TABS
2.5 | ORAL_TABLET | Freq: Every day | ORAL | 3 refills | 30.00000 days | Status: AC
Start: 2024-04-19 — End: ?

## 2024-04-29 ENCOUNTER — Inpatient Hospital Stay: Admit: 2024-04-29 | Payer: MEDICARE | Primary: Internal Medicine

## 2024-04-29 LAB — PSA, DIAGNOSTIC: PSA: 2.1 ng/mL (ref 0.0–4.0)

## 2024-04-30 ENCOUNTER — Ambulatory Visit: Payer: MEDICARE | Attending: Physician Assistant | Primary: Internal Medicine

## 2024-04-30 NOTE — Patient Instructions (Incomplete)
 Patient Information from Today's Visit    The members of your Oncology Medical Home are listed below:    Physician Provider: Elsie Pina, Urologic Oncologist  Advanced Practice Clinician: Rocky Mead, PA  Nurse Navigator: Ronal BROCKS., RN  Medical Assistant: Mitzie POUR., MA  Scheduler:Brittany C.  Supportive Care Services: Kallison G., LMSW and Leonor DEL., LMSW    Diagnosis       Follow Up Instructions:        Current Labs:        Please refer to After Visit Summary or MyChart for upcoming appointment information. Please call our office for rescheduling needs at least 24 hours before your scheduled appointment time.If you have any questions regarding your upcoming schedule please reach out to your care team through MyChart or call [phone].     Please notify your assigned Nurse Navigator of any unplanned hospital admissions or Emergency Room visits within 24 hours of discharge.    -------------------------------------------------------------------------------------------------------------------  Please call our office at [phone] if you have any  of the following symptoms:   Fever of 100.5 or greater  Chills  Shortness of breath  Swelling or pain in one leg    After office hours an answering service is available and will contact a provider for emergencies or if you are experiencing any of the above symptoms.        Mitzie POUR., CMA

## 2024-04-30 NOTE — Telephone Encounter (Signed)
 Physician provider: Dr. Dee  Reason for today's call (Please detail here patients chief complaint): Request Appointments     Last office visit:na  Patient Callback Number: [phone]  Was callback number verified?: Yes  Preferred pharmacy (If refill request): na  Veriified that patient confirmed no refills left at pharmacy? :No  Has the patient called the office for this concern within the last 48 hours?:No    Red Word Mentioned  Warm Transfer Phone Call to (Name): na    Patient notified that their information will be routed to the appropriate team for review. Patient is advised that they will receive a phone call from the appropriate department. If awaiting a call from the triage department and symptoms worsen before receiving a call back, the patient has been advised to proceed to the nearest ED.

## 2024-05-03 ENCOUNTER — Other Ambulatory Visit: Payer: MEDICARE | Primary: Internal Medicine

## 2024-05-06 ENCOUNTER — Encounter: Payer: MEDICARE | Attending: Urology | Primary: Internal Medicine

## 2024-05-22 ENCOUNTER — Ambulatory Visit: Admit: 2024-05-22 | Discharge: 2024-05-22 | Payer: MEDICARE | Primary: Internal Medicine

## 2024-05-22 VITALS — BP 139/51 | HR 55 | Ht 65.0 in | Wt 168.8 lb

## 2024-05-22 DIAGNOSIS — S0120XA Unspecified open wound of nose, initial encounter: Principal | ICD-10-CM

## 2024-05-22 MED ORDER — MUPIROCIN 2 % EX OINT
2 | CUTANEOUS | 1 refills | 14.00000 days | Status: AC
Start: 2024-05-22 — End: ?

## 2024-05-22 NOTE — Progress Notes (Signed)
 [facility]  _______________________________________  Selinda Rink, MD  Christopher Novak, NP   Mliss Cater, NP   Ozell Hasten, MD  Hoy Alver Andreas, MD    [address] [place], [place] [address]  Phone: [phone]  Fax: [phone]      Christopher Gonzalez (DOB: 1941/03/11) presents today c/o    Chief Complaint   Patient presents with    Other     Cut nose 2 months ago while shaving and its just not healing          Assessment/Plan:  Open wound of skin of nose  -     mupirocin (BACTROBAN) 2 % ointment; Apply topically 3 times daily for 5 days, Disp-30 g, R-1, Normal  Skin lesion that has not healed for two months. He is trying OTC Vitamin E, neosporin, keeping wound dry during the day. It scabs then the scab falls off and then restarts the healing process. Will try mupirocin three times a day for 5 days. If still not healing, concern of cancer and will meet with dermatologist (already established)    Follow up if wound does not heal      HPI  Patient presents to discuss cut on his nose that is not healing.     He was out of town for golf and got poision ivy. He was placed on steroid dose pack for this at urgent care. He also had them look at his nose. They gave him an oral antibiotic for the nose in which he took for two days but stopped due to GI upset.     He states his nose continues to scab over and then will bleed again and fall off. Then it will try to heal again. He is using Vitamin E, neosporin if the scab falls off, and wearing a bandaid at night to prevent from touching his nose.         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        States scabs over then bleeds after a day and then healing process restarts. Does not state it has grown in size. Normal pain levels. Has not noticed increased heat or swelling or exudate           Vitals:    05/22/24 0804 05/22/24 0805   BP: (!) 149/58 (!) 139/51   BP Site: Right Upper Arm Left Upper Arm   Patient Position: Sitting Sitting   BP Cuff Size: Large Adult Large Adult   Pulse: 55    SpO2: 96%    Weight: 76.6 kg (  168 lb 12.8 oz)    Height: 1.651 m (5' 5)          Physical Examination:   Physical Exam  HENT:      Nose:        Comments: Circular open wound approximately 1 cm in diameter on ball of nose with dried blood/scabbing surrounding. No pain when wiping with alcohol swab. No discharge or exudate noted        No results found for any visits on 05/22/24.        Mliss Jenkins Cater, APRN - NP

## 2024-05-29 ENCOUNTER — Ambulatory Visit
Admit: 2024-05-29 | Discharge: 2024-05-29 | Payer: MEDICARE | Attending: Physician Assistant | Primary: Internal Medicine

## 2024-05-29 VITALS — BP 186/79 | HR 56 | Resp 18 | Ht 65.0 in | Wt 161.0 lb

## 2024-05-29 DIAGNOSIS — C61 Malignant neoplasm of prostate: Principal | ICD-10-CM

## 2024-05-29 NOTE — Progress Notes (Signed)
 Urologic Oncology  [facility]  [address]  [place], [place] [address]  [phone]        [name] is a 83 y.o. 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 On Dutasteride  and active surveillance.     INTERVAL HISTORY:    Patient returns today for follow-up evaluation for his history of GS 3+3 equal 6 prostate cancer.    He has been doing well since his last visit. He continues to have good control of his prior BPH symptoms with Flomax  and Dutasteride .    PSA on 04/29/2024 was overall stable at 2.1, previously 2.3 on October 26, 2022.    DRE 11/06/2023 demonstrated approximately 80 cc gland without nodule or induration.    From previous note (11/06/23):  Patient is here today for follow-up of his prostate cancer and BPH.  He remains on Flomax  and dutasteride .  He states his lower urinary tract symptoms are as good as they have been.  He empties easily.  He denies any hematuria or dysuria.     He was diagnosed with Gleason 3+3 equal 6 prostate cancer in April 2018 and has been on surveillance since then.  His PSA has been as high as 3.9.  It was rechecked in July and was down to 3.4.  On 10/27/2023 it is down to 2.3.     His last prostate MRI was 12/26/2022 and showed a gland measuring 102 cc with a PSA density of around 0.06.  His MRI showed what was thought to be a large PI-RADS 5 lesion and a PI-RADS 4 lesion.  There was also mention of a 9 mm right pelvic lymph node.  I suspect the air in the prostate that was called a PI-RADS 5 lesion was actually a BPH nodule.     Patient states he needs refills on his Flomax  dutasteride  and tadalafil .    Past medical, family and social histories, as well as medications and allergies, were reviewed and updated in the medical record as appropriate.    PMH:     Past Medical History:   Diagnosis Date    Elevated PSA     Erectile dysfunction     Hypercholesterolemia     Personal  history of prostate cancer     Prostate cancer (HCC)        MEDs:     amLODIPine   aspirin  atorvastatin   BIOTIN PO  cyanocobalamin  dutasteride   meclizine  mupirocin  tadalafil   tamsulosin      ALLERGIES:    Allergies   Allergen Reactions    Codeine Nausea And Vomiting       ROS:     Review of Systems   Constitutional:  Negative for unexpected weight change.   Genitourinary:  Negative for difficulty urinating, dysuria, frequency, hematuria and urgency.   All other systems reviewed and are negative.       PHYSICAL EXAMINATION    BP (!) 186/79   Pulse 56   Resp 18   Ht 1.651 m (5' 5)   Wt 73 kg (161 lb)   SpO2 98%   BMI 26.79 kg/m   General: well dressed, well nourished, no acute distress  Skin: no rashes  HEENT: Sclera are clear, normocephalic, atraumatic. no external lesions   Cardiovascular: Reg. Normal perfusion  Respiratory: normal respiratory effort, no JVD, no audible wheezing.  Musculoskeletal: unremarkable with normal function. No embolic signs  or cyanosis.   Neurologic exam: intact, no focal deficits, moves all 4 extremities  Psych: normal mood and affect, alert, oriented x 3  LE:  no edema  GI/GU: DEFERRED        LABORATORY RESULTS:    Lab Results   Component Value Date/Time    PSA 2.1 04/29/2024 02:30 PM    PSA 2.3 10/27/2023 11:00 AM    PSA 3.4 05/01/2023 07:49 AM          IMAGING:      MRI Results:    === 12/26/22 ===    MRI PROSTATE W WO CONTRAST    - Narrative -  EXAMINATION: MRI PROSTATE W WO CONTRAST 12/26/2022 5:21 PM    ACCESSION NUMBER: [id number]    COMPARISON: MRI of June 15, 2019    INDICATION: Malignant neoplasm of prostate    TECHNIQUE:  Multiplanar, multisequence MR imaging was performed of the prostate  prior to and following gadolinium contrast. 15 mL Prohance  contrast material was  administrated intravenously for the examination. This study was acquired  following the IV administration of contrast material, given the patient's  indications for the examination. If IV contrast  material had not been  administered, the likely of detecting abnormalities relevant to the patient's  condition would have been substantially decreased.  All lesions assessed are  based on PI-RAD compliance.    FINDINGS:  The prostate measures 6.5 x 6.0 x 5.0 cm giving a volume of 102 cc.    BPH: No significant BPH.    Hemorrhage: None.    Peripheral Zone: No suspicious peripheral zone lesion. No evidence of  prostatitis.    Transition/Central Zone: 2 suspicious transition zone lesions are demonstrated  as below.    Lesion # 1  -Size and location: 4.35 x 2.0 cm: Transitional zone lesion which has  significantly enlarged and extended into the bladder. This lesion has several  hypointense foci within it which show diffusion restriction and abnormal  enhancement concerning for malignancy. This is seen in the TZ anterior at the  base  -DWI/ADC: Positive  -DCE: Positive  -Prostate Margin: Intact  -PI-RADS Category: PI-RADS 5    Lesion # 2  -Size and location: Less than 1 cm transitional zone anterior lesion in the mid  prostate on the left which shows diffusion restriction and enhancement.  -DWI/ADC: Positive  -DCE: Positive  -Prostate Margin: Intact  -PI-RADS Category: PI-RADS 4    Neurovascular Bundles: Intact.    Seminal Vesicles: The seminal vesicles are unremarkable.    Lymph Nodes: Note is made of a round 9 mm lymph node on the right which shows  diffusion restriction and enhancement. This is enlarged significantly when  compared with 2020..    - Impression -  1.  PI-RADS 5 lesion in the transitional zone anteriorly at the base on the  right.  2.  PI-RADS 4 lesion on the left in the anterior transitional zone at the mid  prostate.  3.  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        ASSESSMENT:    [name] is a 83 y.o. 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 On Dutasteride  and active  surveillance.  We again discussed when repeat prostate biopsy would be indicated.  Patient very much would like to prevent any further biopsies unless necessary and would like to continue with surveillance.  He will follow-up in 6 months with repeat PSA 2 to 3 days prior.  He understands a referral will be made to [facility] view for follow-up due to Dr. Rena departure      PLAN:     - PSA reviewed  - Patient would like to continue with surveillance  - RTC 6 months with PSA 2-3 days prior  - Referral to PGU, patient requests Dr. Cherylann  ________________________________________            I have seen and examined this patient.  I have reviewed and edited the note started by the MA and agree with the outlined plan.  Part of this note was written by using a voice dictation software. The note has been proof read but may still contain some grammatical/other typographical errors.      Rocky Mead, PA-C  Urologic Oncology  [facility]

## 2024-05-29 NOTE — Patient Instructions (Addendum)
 Patient Information from Today's Visit    The members of your Oncology Medical Home are listed below:    Physician Provider: Elsie Pina, Urologic Oncologist  Advanced Practice Clinician: Rocky Mead, PA  Nurse Navigator: Ronal BROCKS., RN  Medical Assistant: Mitzie POUR., MA  Scheduler:Brittany C.  Supportive Care Services: Leonor DEL., LMSW    Diagnosis: Prostate Cancer    Follow Up Instructions:    Follow up office visit today  Reviewed issues and symptoms  You PSA is down and stable   We will continue surveillance of your PSA   We will see you back in         Current Labs:    Results for orders placed or performed during the hospital encounter of 04/29/24   PSA, Diagnostic   Result Value Ref Range    PSA 2.1 0.0 - 4.0 ng/mL         Please refer to After Visit Summary or MyChart for upcoming appointment information. Please call our office for rescheduling needs at least 24 hours before your scheduled appointment time.If you have any questions regarding your upcoming schedule please reach out to your care team through MyChart or call [phone].     Please notify your assigned Nurse Navigator of any unplanned hospital admissions or Emergency Room visits within 24 hours of discharge.    -------------------------------------------------------------------------------------------------------------------  Please call our office at [phone] if you have any  of the following symptoms:   Fever of 100.5 or greater  Chills  Shortness of breath  Swelling or pain in one leg    After office hours an answering service is available and will contact a provider for emergencies or if you are experiencing any of the above symptoms.        Mitzie POUR., CMA

## 2024-06-01 ENCOUNTER — Encounter

## 2024-06-04 MED ORDER — ATORVASTATIN CALCIUM 20 MG PO TABS
20 | ORAL_TABLET | Freq: Every day | ORAL | 1 refills | 90.00000 days | Status: DC
Start: 2024-06-04 — End: 2024-10-30

## 2024-06-04 NOTE — Telephone Encounter (Signed)
 Requested Prescriptions     Pending Prescriptions Disp Refills    atorvastatin  (LIPITOR) 20 MG tablet [Pharmacy Med Name: ATORVASTATIN  CALCIUM  20 MG Oral Tablet] 90 tablet 1     Sig: TAKE 1 TABLET EVERY DAY     Verified rx. Last OV 11/27/23. Erx to pharm on file.

## 2024-06-19 ENCOUNTER — Ambulatory Visit
Admit: 2024-06-19 | Discharge: 2024-06-19 | Payer: MEDICARE | Attending: Cardiovascular Disease | Primary: Internal Medicine

## 2024-06-19 VITALS — BP 130/60 | HR 61 | Ht 65.0 in | Wt 165.0 lb

## 2024-06-19 DIAGNOSIS — I2510 Atherosclerotic heart disease of native coronary artery without angina pectoris: Principal | ICD-10-CM

## 2024-06-19 NOTE — Progress Notes (Signed)
 [facility]  [address], [address]  [place], [place] [address]  PHONE: [phone]        06/19/24        NAME:  Christopher Gonzalez  DOB: [birth year]  MRN: [id number]     Elevated CACS  Dyslipidemia    CHIEF COMPLAINT:    Coronary Artery Disease      SUBJECTIVE:     No chest pain or palpitation or dizziness. Good year.       Medications were all reviewed with the patient today and updated as necessary.   Current Outpatient Medications   Medication Sig    atorvastatin  (LIPITOR) 20 MG tablet TAKE 1 TABLET EVERY DAY    mupirocin  (BACTROBAN ) 2 % ointment Apply topically 3 times daily for 5 days    amLODIPine  (NORVASC ) 2.5 MG tablet TAKE 1 TABLET EVERY DAY    dutasteride  (AVODART ) 0.5 MG capsule Take 1 capsule by mouth daily    tadalafil  (CIALIS ) 20 MG tablet Take 1 tablet by mouth as needed for Erectile Dysfunction for erectile dysfunction    tamsulosin  (FLOMAX ) 0.4 MG capsule Take 2 capsules by mouth daily    tadalafil  (CIALIS ) 20 MG tablet Take 1 tablet by mouth daily as needed for Erectile Dysfunction    BIOTIN PO Take 5,000 mcg by mouth daily    aspirin 81 MG EC tablet Take 1 tablet by mouth daily    cyanocobalamin 100 MCG tablet Take 1 tablet by mouth daily    meclizine (ANTIVERT) 12.5 MG tablet Take by mouth as needed PRN     No current facility-administered medications for this visit.        Allergies   Allergen Reactions    Codeine Nausea And Vomiting           PHYSICAL EXAM:     Wt Readings from Last 3 Encounters:   06/19/24 74.8 kg (165 lb)   05/29/24 73 kg (161 lb)   05/22/24 76.6 kg (168 lb 12.8 oz)     BP Readings from Last 3 Encounters:   06/19/24 130/60   05/29/24 (!) 186/79   05/22/24 (!) 139/51       BP 130/60   Pulse 61   Ht 1.651 m (5' 5)   Wt 74.8 kg (165 lb)   BMI 27.46 kg/m     Physical Exam  Vitals reviewed.   HENT:      Head: Normocephalic and atraumatic.   Eyes:      Extraocular Movements: Extraocular movements intact.      Pupils: Pupils are equal, round, and reactive to  light.   Cardiovascular:      Rate and Rhythm: Normal rate.      Heart sounds: Normal heart sounds.   Pulmonary:      Effort: Pulmonary effort is normal.      Breath sounds: Normal breath sounds.   Abdominal:      General: Abdomen is flat.      Palpations: Abdomen is soft. There is no mass.   Musculoskeletal:         General: Normal range of motion.      Cervical back: Normal range of motion.   Skin:     General: Skin is warm and dry.   Neurological:      General: No focal deficit present.      Mental Status: He is alert and oriented to person, place, and time.   Psychiatric:         Mood  and Affect: Mood normal.           RECENT LABS AND RECORDS REVIEW    On 12/29/23:  T chol 139,ldl 61    No results found for any visits on 06/19/24.   ASSESSMENT and PLAN    Christopher Gonzalez was seen today for coronary artery disease.    Diagnoses and all orders for this visit:    Hyperlipidemia  Doing well. Continue current rx.  Agatston coronary artery calcium  score greater than 400  Doing well. Continue current rx.  Htn  Doing well.  Continue current therapy    Return in about 1 year (around 06/19/2025).       CATHLYN LELON PRICE, MD  06/19/2024  10:54 AM

## 2024-07-01 ENCOUNTER — Encounter: Payer: MEDICARE | Attending: Internal Medicine | Primary: Internal Medicine

## 2024-07-10 ENCOUNTER — Other Ambulatory Visit: Admit: 2024-07-10 | Discharge: 2024-07-10 | Payer: MEDICARE | Primary: Internal Medicine

## 2024-07-10 ENCOUNTER — Ambulatory Visit: Admit: 2024-07-10 | Discharge: 2024-07-10 | Payer: MEDICARE | Attending: Internal Medicine | Primary: Internal Medicine

## 2024-07-10 NOTE — Progress Notes (Signed)
 "Medicare Annual Wellness Visit    Christopher Gonzalez is here for Medicare AWV (Needs blood work)    Assessment & Plan   Medicare annual wellness visit, subsequent  Prediabetes  -     Comprehensive Metabolic Panel; Future  -     CBC with Auto Differential; Future  -     Magnesium; Future  -     Hemoglobin A1C; Future  Primary hypertension  -     Comprehensive Metabolic Panel; Future  -     CBC with Auto Differential; Future  -     Magnesium; Future  Hyperlipidemia, unspecified hyperlipidemia type     Return in about 3 months (around 10/10/2024) for follow up.     Subjective       Patient's complete Health Risk Assessment and screening values have been reviewed and are found in Flowsheets. The following problems were reviewed today and where indicated follow up appointments were made and/or referrals ordered.    Positive Risk Factor Screenings with Interventions:              Inactivity:  On average, how many days per week do you engage in moderate to strenuous exercise (like a brisk walk)?: 2 days (!) Abnormal  On average, how many minutes do you engage in exercise at this level?: 60 min  Interventions:  Patient advised to follow up in the office for further evaluation and treatment         Safety:  Do you have non-slip mats or non-slip surfaces or shower bars or grab bars in your shower or bathtub?: (!) No  Interventions:  Patient advised to follow up in the office for further evaluation and treatment                    Objective   Vitals:    07/10/24 1324 07/10/24 1335   BP: (!) 170/71 (!) 162/66   BP Site: Left Upper Arm Right Upper Arm   Patient Position: Sitting Sitting   Pulse: 62 63   Resp: 16    Temp: 97.8 F (36.6 C)    TempSrc: Temporal    SpO2: 98%    Weight: 77.6 kg (171 lb)    Height: 1.651 m (5' 5)       Body mass index is 28.46 kg/m.                  Allergies   Allergen Reactions    Codeine Nausea And Vomiting     Prior to Visit Medications   Medication Sig Taking? Authorizing Provider   atorvastatin   (LIPITOR) 20 MG tablet TAKE 1 TABLET EVERY DAY Yes San, Greg W, MD   mupirocin  (BACTROBAN ) 2 % ointment Apply topically 3 times daily for 5 days Yes Mackin, Mliss Caldron, APRN - NP   amLODIPine  (NORVASC ) 2.5 MG tablet TAKE 1 TABLET EVERY DAY Yes San, Cathlyn ORN, MD   dutasteride  (AVODART ) 0.5 MG capsule Take 1 capsule by mouth daily Yes Dee Elsie DASEN, MD   tadalafil  (CIALIS ) 20 MG tablet Take 1 tablet by mouth as needed for Erectile Dysfunction for erectile dysfunction Yes Dee Elsie DASEN, MD   tamsulosin  (FLOMAX ) 0.4 MG capsule Take 2 capsules by mouth daily Yes Dee Elsie DASEN, MD   tadalafil  (CIALIS ) 20 MG tablet Take 1 tablet by mouth daily as needed for Erectile Dysfunction Yes Looper, Erin H, PA   BIOTIN PO Take 5,000 mcg by mouth daily Yes Automatic Reconciliation, Ar  aspirin 81 MG EC tablet Take 1 tablet by mouth daily Yes Automatic Reconciliation, Ar   cyanocobalamin 100 MCG tablet Take 1 tablet by mouth daily Yes Automatic Reconciliation, Ar   meclizine (ANTIVERT) 12.5 MG tablet Take by mouth as needed PRN Yes Automatic Reconciliation, Ar       CareTeam (Including outside providers/suppliers regularly involved in providing care):   Patient Care Team:  Jacob Mariam GRADE, MD as PCP - General (Internal Medicine)  Theodoro, Lynwood JONELLE Raddle., MD as PCP - UROLOGY  Jacob, Mariam GRADE, MD as PCP - Empaneled Provider  Elnor Dome, PA (Physician Assistant)     Recommendations for Preventive Services Due: see orders and patient instructions/AVS.  Recommended screening schedule for the next 5-10 years is provided to the patient in written form: see Patient Instructions/AVS.     Reviewed and updated this visit:  Allergies  Meds                   [facility]  [address]  [place], [place] [address]  Phone: [phone]  Fax: [phone]      Problem Based Overview with Integrated Assessment and Plan  [name]    History of Present Illness  The patient presents for an annual  wellness visit.    Persistent Bleeding  - He has persistent bleeding from a shaving cut for 4-6 weeks despite using antibiotic cream, new skin brush-on, and medicated patches.  - He is concerned about cauterization.  - Dermatology appointment scheduled for 07/24/2024.  - He continues daily aspirin 81 mg.    Medication Consideration  - Considering discontinuing atorvastatin  due to lack of recent cholesterol test.  - No issues with medication.  - No significant coronary disease.  - Calcium  score in 2019 showed extensive plaque burden (total score 924, 60th percentile for age).  - Normal stress test in 2019.    Hypertension  - Home blood pressure monitoring typically ranges 130-140/70-72, but fluctuates.  - On amlodipine  2.5 mg for hypertension.    Prostate Cancer  - Under urologist care for prostate cancer, next appointment 11/2024.  - Taking Cialis , tamsulosin , and dutasteride  without issues.      Exercises twice a week.       Assessment & Plan  1. Non-healing wound:  - Daily aspirin 81 mg may impede clot formation and healing.  - Discussed possibility of underlying skin cancer. Advised to discontinue aspirin for a few weeks.  - Referral to dermatologist or wound care specialist for potential cauterization with silver nitrate.    2. Coronary artery disease:  - History of coronary artery disease with calcium  score 924 (60th percentile for age).  - Emphasized importance of continuing atorvastatin  for plaque stabilization.    3. Hypertension:  - Consistently elevated blood pressure on amlodipine  2.5 mg.  - Continue daily home monitoring and report elevations above 140 mmHg.  - Consider increasing amlodipine  dosage if blood pressure remains high.    4. Prostate cancer:  - Following up with urology, next appointment 11/2024.  - Taking tamsulosin , dutasteride , and Cialis  without issues.    5. Prediabetes:  - Diagnosed in 2019, follow-up in 2022. Recommended recheck of blood sugar levels.    6. Health maintenance:  -  Advised precautions to reduce fall risk: clear paths, install grab bars, use non-slip rugs.    Follow-up: In 3 months.  1. Medicare annual wellness visit, subsequent  2. Prediabetes  -  Comprehensive Metabolic Panel; Future  -     CBC with Auto Differential; Future  -     Magnesium; Future  -     Hemoglobin A1C; Future  3. Primary hypertension  Overview:  BP: 137/60     Orders:  -     Comprehensive Metabolic Panel; Future  -     CBC with Auto Differential; Future  -     Magnesium; Future  4. Hyperlipidemia, unspecified hyperlipidemia type  Overview:  Lab Results   Component Value Date    CHOL 114 03/31/2022    TRIG 67 03/31/2022    HDL 60 03/31/2022    LDL 40.6 03/31/2022    VLDL 13.4 03/31/2022    CHOLHDLRATIO 1.9 03/31/2022           Current Outpatient Medications   Medication Instructions    amLODIPine  (NORVASC ) 2.5 mg, Oral, DAILY    aspirin 81 mg, DAILY    atorvastatin  (LIPITOR) 20 mg, Oral, DAILY    BIOTIN PO 5,000 mcg, DAILY    cyanocobalamin 100 mcg, DAILY    dutasteride  (AVODART ) 0.5 mg, Oral, DAILY    meclizine (ANTIVERT) 12.5 MG tablet PRN    mupirocin  (BACTROBAN ) 2 % ointment Apply topically 3 times daily for 5 days    tadalafil  (CIALIS ) 20 mg, Oral, DAILY PRN    tadalafil  (CIALIS ) 20 mg, Oral, PRN, for erectile dysfunction    tamsulosin  (FLOMAX ) 0.8 mg, Oral, DAILY     Vitals:    07/10/24 1324 07/10/24 1335   BP: (!) 170/71 (!) 162/66   BP Site: Left Upper Arm Right Upper Arm   Patient Position: Sitting Sitting   Pulse: 62 63   Resp: 16    Temp: 97.8 F (36.6 C)    TempSrc: Temporal    SpO2: 98%    Weight: 77.6 kg (171 lb)    Height: 1.651 m (5' 5)      BP Readings from Last 3 Encounters:   07/10/24 (!) 162/66   06/19/24 130/60   05/29/24 (!) 186/79     Body mass index is 28.46 kg/m.  Wt Readings from Last 3 Encounters:   07/10/24 77.6 kg (171 lb)   06/19/24 74.8 kg (165 lb)   05/29/24 73 kg (161 lb)         07/10/2024     1:31 PM   PHQ-9    Little interest or pleasure in doing things 0   Feeling  down, depressed, or hopeless 0   PHQ-2 Score 0   PHQ-9 Total Score 0      Physical Exam  Constitutional:       Appearance: Normal appearance.   HENT:      Head: Normocephalic and atraumatic.   Cardiovascular:      Rate and Rhythm: Normal rate.   Neurological:      General: No focal deficit present.      Mental Status: He is alert and oriented to person, place, and time.           Lab Results   Component Value Date    NA 141 12/29/2023    K 4.2 12/29/2023    CL 106 12/29/2023    CO2 26 12/29/2023    BUN 16 12/29/2023    CREATININE 1.10 12/29/2023    GLUCOSE 92 12/29/2023    CALCIUM  9.2 12/29/2023    BILITOT 0.6 12/29/2023    ALKPHOS 108 12/29/2023    AST 32 12/29/2023    ALT 22  12/29/2023    LABGLOM 67 12/29/2023    GFRAA >60 01/26/2021    AGRATIO 1.4 01/26/2021    GLOB 3.0 12/29/2023     Lab Results   Component Value Date    WBC 6.6 12/29/2023    HGB 13.9 12/29/2023    HCT 42.9 12/29/2023    MCV 89.9 12/29/2023    PLT 157 12/29/2023    LYMPHOPCT 27.3 12/29/2023    RBC 4.77 12/29/2023    MCH 29.1 12/29/2023    MCHC 32.4 12/29/2023    RDW 13.7 12/29/2023     Lab Results   Component Value Date    CHOL 139 12/29/2023    TRIG 38 12/29/2023    HDL 70 (H) 12/29/2023    LDL 61 12/29/2023    VLDL 8 12/29/2023    CHOLHDLRATIO 2.0 12/29/2023     Hemoglobin A1C   Date Value Ref Range Status   01/26/2021 5.5 4.20 - 6.30 % Final   05/22/2018 5.7 (H) 4.8 - 5.6 % Final     Comment:              Prediabetes: 5.7 - 6.4           Diabetes: >6.4           Glycemic control for adults with diabetes: <7.0       Lab Results   Component Value Date    TSH 1.430 03/31/2022    TSHELE 1.23 12/29/2023       Results  Labs   - Cholesterol test: 12/2023, Normal    Imaging   - Calcium  score: 2019, Extensive plaque burden (total score 924, 60th percentile for age)         Return in about 3 months (around 10/10/2024) for follow up.    -- Mariam CINDERELLA Fleischer, MD     The patient consented verbally to the use of ambient scribing software to assist with the  documentation for this visit.   "

## 2024-07-10 NOTE — Patient Instructions (Signed)
 "     Learning About Being Active as an Older Adult  Why is being active important as you get older?     Being active is one of the best things you can do for your health. And it's never too late to start. Being active--or getting active, if you aren't already--has definite benefits. It can:  Give you more energy,  Keep your mind sharp.  Improve balance to reduce your risk of falls.  Help you manage chronic illness with fewer medicines.  No matter how old you are, how fit you are, or what health problems you have, there is a form of activity that will work for you. And the more physical activity you can do, the better your overall health will be.  What kinds of activity can help you stay healthy?  Being more active will make your daily activities easier. Physical activity includes planned exercise and things you do in daily life. There are four types of activity:  Aerobic.  Doing aerobic activity makes your heart and lungs strong.  Includes walking, dancing, and gardening.  Aim for at least 2 hours spread throughout the week.  It improves your energy and can help you sleep better.  Muscle-strengthening.  This type of activity can help maintain muscle and strengthen bones.  Includes climbing stairs, using resistance bands, and lifting or carrying heavy loads.  Aim for at least twice a week.  It can help protect the knees and other joints.  Stretching.  Stretching gives you better range of motion in joints and muscles.  Includes upper arm stretches, calf stretches, and gentle yoga.  Aim for at least twice a week, preferably after your muscles are warmed up from other activities.  It can help you function better in daily life.  Balancing.  This helps you stay coordinated and have good posture.  Includes heel-to-toe walking, tai chi, and certain types of yoga.  Aim for at least 3 days a week.  It can reduce your risk of falling.  Even if you have a hard time meeting the recommendations, it's better to be more active  than less active. All activity done in each category counts toward your weekly total. You'd be surprised how daily things like carrying groceries, keeping up with grandchildren, and taking the stairs can add up.  What keeps you from being active?  If you've had a hard time being more active, you're not alone. Maybe you remember being able to do more. Or maybe you've never thought of yourself as being active. It's frustrating when you can't do the things you want. Being more active can help. What's holding you back?  Getting started.  Have a goal, but break it into easy tasks. Small steps build into big accomplishments.  Staying motivated.  If you feel like skipping your activity, remember your goal. Maybe you want to move better and stay independent. Every activity gets you one step closer.  Not feeling your best.  Start with 5 minutes of an activity you enjoy. Prove to yourself you can do it. As you get comfortable, increase your time.  You may not be where you want to be. But you're in the process of getting there. Everyone starts somewhere.  How can you find safe ways to stay active?  Talk with your doctor about any physical challenges you're facing. Make a plan with your doctor if you have a health problem or aren't sure how to get started with activity.  If you're already  active, ask your doctor if there is anything you should change to stay safe as your body and health change.  If you tend to feel dizzy after you take medicine, avoid activity at that time. Try being active before you take your medicine. This will reduce your risk of falls.  If you plan to be active at home, make sure to clear your space before you get started. Remove things like TV cords, coffee tables, and throw rugs. It's safest to have plenty of space to move freely.  The key to getting more active is to take it slow and steady. Try to improve only a little bit at a time. Pick just one area to improve on at first. And if an activity hurts,  stop and talk to your doctor.  Where can you learn more?  Go to [employer] and enter P600 to learn more about Learning About Being Active as an Older Adult.  Current as of: May 03, 2023  Content Version: 14.6   2024-2025 [place], [place].   Care instructions adapted under license by [facility]. If you have questions about a medical condition or this instruction, always ask your healthcare professional. Romayne Alderman, [facility], disclaims any warranty or liability for your use of this information.         A Healthy Heart: Care Instructions  Overview    Coronary artery disease, also called heart disease, occurs when a substance called plaque builds up in the vessels that supply oxygen-rich blood to your heart muscle. This can narrow the blood vessels and reduce blood flow. A heart attack happens when blood flow is completely blocked. A high-fat diet, smoking, and other factors increase the risk of heart disease.  Your doctor has found that you have a chance of having heart disease. A heart-healthy lifestyle can help keep your heart healthy and prevent heart disease. This lifestyle includes eating healthy, being active, staying at a weight that's healthy for you, and not smoking, vaping, or using other tobacco or nicotine products. It also includes taking medicines as directed, managing other health conditions, and trying to get a healthy amount of sleep.  Follow-up care is a key part of your treatment and safety. Be sure to make and go to all appointments, and contact your doctor if you are having problems. It's also a good idea to know your test results and keep a list of the medicines you take.  How can you care for yourself at home?  Diet  Use less salt when you cook and eat. This helps lower your blood pressure. Taste food before salting. Add only a little salt when you think you need it. With time, your taste buds will adjust to less salt.  Eat fewer snack items, fast foods,  canned soups, and other high-salt, high-fat, processed foods.  Read food labels and try to avoid saturated and trans fats. They increase your risk of heart disease by raising cholesterol levels.  Limit the amount of solid fat--butter, margarine, and shortening--you eat. Use olive, peanut, or canola oil when you cook. Bake, broil, and steam foods instead of frying them.  Eat a variety of fruit and vegetables every day. Dark green, deep orange, red, or yellow fruits and vegetables are especially good for you. Examples include spinach, carrots, peaches, and berries.  Foods high in fiber can reduce your cholesterol and provide important vitamins and minerals. High-fiber foods include whole-grain cereals and breads, oatmeal, beans, brown rice, citrus fruits, and apples.  Eat lean  proteins. Heart-healthy proteins include seafood, lean meats and poultry, eggs, beans, peas, nuts, seeds, and soy products.  Limit drinks and foods with added sugar. These include candy, desserts, and soda pop.  Heart-healthy lifestyle  If your doctor recommends it, get more exercise. For many people, walking is a good choice. Or you may want to swim, bike, or do other activities. Bit by bit, increase the time you're active every day. Try for at least 30 minutes on most days of the week.  If you smoke, vape, or use other tobacco or nicotine products, try to quit. If you cant quit, cut back as much as you can. If you need help quitting, talk to your doctor about quit programs and medicines. Quitting is one of the most important things you can do to protect your heart. Also avoid secondhand smoke and the aerosol mist from vaping.  Stay at a weight that's healthy for you. Talk to your doctor if you need help losing weight.  Try to get 7 to 9 hours of sleep each night.  Limit alcohol to 2 drinks a day for men and 1 drink a day for women. Too much alcohol can cause health problems.  Manage other health problems such as diabetes, high blood pressure,  and high cholesterol. If you think you may have a problem with alcohol or drug use, talk to your doctor.  Medicines  Take your medicines exactly as prescribed. Contact your doctor if you think you are having a problem with your medicine.  When should you call for help?  Call 911 if you have symptoms of a heart attack. These may include:  Chest pain or pressure, or a strange feeling in the chest.  Sweating.  Shortness of breath.  Pain, pressure, or a strange feeling in the back, neck, jaw, or upper belly or in one or both shoulders or arms.  Lightheadedness or sudden weakness.  A fast or irregular heartbeat.  After you call 911, the operator may tell you to chew 1 adult-strength or 2 to 4 low-dose aspirin. Wait for an ambulance. Do not try to drive yourself.  Watch closely for changes in your health, and be sure to contact your doctor if you have any problems.  Where can you learn more?  Go to [employer] and enter F075 to learn more about A Healthy Heart: Care Instructions.  Current as of: May 03, 2023  Content Version: 14.6   2024-2025 [place], [place].   Care instructions adapted under license by [facility]. If you have questions about a medical condition or this instruction, always ask your healthcare professional. Romayne Alderman, [facility], disclaims any warranty or liability for your use of this information.    Personalized Preventive Plan for Christopher Gonzalez [facility] - 07/10/2024  Medicare offers a range of preventive health benefits. Some of the tests and screenings are paid in full while other may be subject to a deductible, co-insurance, and/or copay.  Some of these benefits include a comprehensive review of your medical history including lifestyle, illnesses that may run in your family, and various assessments and screenings as appropriate.  After reviewing your medical record and screening and assessments performed today your provider may have ordered immunizations, labs,  imaging, and/or referrals for you.  A list of these orders (if applicable) as well as your Preventive Care list are included within your After Visit Summary for your review.      "

## 2024-07-11 ENCOUNTER — Encounter

## 2024-07-11 LAB — COMPREHENSIVE METABOLIC PANEL
ALT: 22 U/L (ref 8–55)
AST: 32 U/L (ref 15–37)
Albumin/Globulin Ratio: 1.2 (ref 1.0–1.9)
Albumin: 3.5 g/dL (ref 3.2–4.6)
Alk Phosphatase: 104 U/L (ref 40–129)
Anion Gap: 7 mmol/L (ref 7–16)
BUN: 20 mg/dL (ref 8–23)
CO2: 27 mmol/L (ref 20–29)
Calcium: 9.3 mg/dL (ref 8.8–10.2)
Chloride: 107 mmol/L (ref 98–107)
Creatinine: 0.99 mg/dL (ref 0.80–1.30)
Est, Glom Filt Rate: 76 ml/min/1.73m2 (ref >60)
Globulin: 2.9 g/dL (ref 2.3–3.5)
Glucose: 90 mg/dL (ref 70–99)
Potassium: 4.4 mmol/L (ref 3.5–5.1)
Sodium: 141 mmol/L (ref 136–145)
Total Bilirubin: 0.6 mg/dL (ref 0.0–1.2)
Total Protein: 6.5 g/dL (ref 6.3–8.2)

## 2024-07-11 LAB — CBC WITH AUTO DIFFERENTIAL
Basophils %: 0.9 % (ref 0.0–2.0)
Basophils Absolute: 0.06 K/UL (ref 0.00–0.20)
Eosinophils %: 4 % (ref 0.5–7.8)
Eosinophils Absolute: 0.27 K/UL (ref 0.00–0.80)
Hematocrit: 38.4 % — ABNORMAL LOW (ref 41.1–50.3)
Hemoglobin: 12.5 g/dL — ABNORMAL LOW (ref 13.6–17.2)
Immature Granulocytes %: 0.6 % (ref 0.0–5.0)
Immature Granulocytes Absolute: 0.04 K/UL (ref 0.0–0.5)
Lymphocytes %: 17.7 % (ref 13.0–44.0)
Lymphocytes Absolute: 1.19 K/UL (ref 0.50–4.60)
MCH: 29.1 pg (ref 26.1–32.9)
MCHC: 32.6 g/dL (ref 31.4–35.0)
MCV: 89.5 FL (ref 82–102)
MPV: 10 FL (ref 9.4–12.3)
Monocytes %: 10.2 % (ref 4.0–12.0)
Monocytes Absolute: 0.69 K/UL (ref 0.10–1.30)
Neutrophils %: 66.6 % (ref 43.0–78.0)
Neutrophils Absolute: 4.49 K/UL (ref 1.70–8.20)
Platelets: 148 K/uL — ABNORMAL LOW (ref 150–450)
RBC: 4.29 M/uL (ref 4.23–5.6)
RDW: 14.1 % (ref 11.9–14.6)
WBC: 6.7 K/uL (ref 4.3–11.1)
nRBC: 0 K/uL (ref 0.0–0.2)

## 2024-07-11 LAB — MAGNESIUM: Magnesium: 1.9 mg/dL (ref 1.8–2.4)

## 2024-07-11 LAB — HEMOGLOBIN A1C
Estimated Avg Glucose: 115 mg/dL
Hemoglobin A1C: 5.6 % (ref 0–5.6)

## 2024-07-23 ENCOUNTER — Other Ambulatory Visit: Admit: 2024-07-23 | Discharge: 2024-07-23 | Payer: MEDICARE | Primary: Internal Medicine

## 2024-07-23 DIAGNOSIS — D649 Anemia, unspecified: Principal | ICD-10-CM

## 2024-07-24 LAB — IRON AND TIBC
Iron % Saturation: 15 % — ABNORMAL LOW (ref 20–50)
Iron: 53 ug/dL (ref 35–100)
TIBC: 356 ug/dL (ref 240–450)
UIBC: 303 ug/dL (ref 112.0–347.0)

## 2024-07-24 LAB — CBC WITH AUTO DIFFERENTIAL
Basophils %: 0.8 % (ref 0.0–2.0)
Basophils Absolute: 0.06 K/UL (ref 0.00–0.20)
Eosinophils %: 3.1 % (ref 0.5–7.8)
Eosinophils Absolute: 0.22 K/UL (ref 0.00–0.80)
Hematocrit: 40.7 % — ABNORMAL LOW (ref 41.1–50.3)
Hemoglobin: 12.9 g/dL — ABNORMAL LOW (ref 13.6–17.2)
Immature Granulocytes %: 0.4 % (ref 0.0–5.0)
Immature Granulocytes Absolute: 0.03 K/UL (ref 0.0–0.5)
Lymphocytes %: 23 % (ref 13.0–44.0)
Lymphocytes Absolute: 1.65 K/UL (ref 0.50–4.60)
MCH: 28.5 pg (ref 26.1–32.9)
MCHC: 31.7 g/dL (ref 31.4–35.0)
MCV: 90 FL (ref 82–102)
MPV: 9.9 FL (ref 9.4–12.3)
Monocytes %: 10.6 % (ref 4.0–12.0)
Monocytes Absolute: 0.76 K/UL (ref 0.10–1.30)
Neutrophils %: 62.1 % (ref 43.0–78.0)
Neutrophils Absolute: 4.46 K/UL (ref 1.70–8.20)
Platelets: 186 K/uL (ref 150–450)
RBC: 4.52 M/uL (ref 4.23–5.6)
RDW: 13.3 % (ref 11.9–14.6)
WBC: 7.2 K/uL (ref 4.3–11.1)
nRBC: 0 K/uL (ref 0.0–0.2)

## 2024-07-24 LAB — VITAMIN B12: Vitamin B-12: 1293 pg/mL — ABNORMAL HIGH (ref 193–986)

## 2024-07-25 ENCOUNTER — Encounter

## 2024-07-25 MED ORDER — FERROUS SULFATE 325 (65 FE) MG PO TABS
325 | ORAL_TABLET | Freq: Every day | ORAL | 1 refills | 30.00000 days | Status: AC
Start: 2024-07-25 — End: ?

## 2024-08-19 ENCOUNTER — Encounter

## 2024-08-19 NOTE — Telephone Encounter (Signed)
"  I think it would be good to be evlauated by dermatology first, I will place a referral to them.  "

## 2024-10-10 ENCOUNTER — Telehealth: Admit: 2024-10-10 | Discharge: 2024-10-10 | Payer: MEDICARE | Attending: Internal Medicine | Primary: Internal Medicine

## 2024-10-10 DIAGNOSIS — I10 Essential (primary) hypertension: Principal | ICD-10-CM

## 2024-10-10 MED ORDER — CARVEDILOL 3.125 MG PO TABS
3.125 | ORAL_TABLET | Freq: Two times a day (BID) | ORAL | 5 refills | 30.00000 days | Status: AC
Start: 2024-10-10 — End: ?

## 2024-10-10 NOTE — Progress Notes (Signed)
 [facility]  [address]  [place], [place] [address]  Phone: [phone]  Fax: [phone]      Problem Based Overview with Integrated Assessment and Plan    Christopher Gonzalez is a 84 y.o. year old male who presents for follow-up visit.    He reports currently having COVID, diagnosed 7 days ago when his girlfriend became ill and tested positive. Patient got tested and was also positive. [place]ested yesterday, which was just barely positive. Plans to retest tomorrow. This is his third or fourth time with COVID, and he reports having no symptoms with any of his COVID infections.    Patient reports follow-up on skin concerns. He saw Dr. Sharl for dermatology referral and had a biopsy that was positive for basal cell carcinoma. He is waiting for his [facility] surgeon to contact him to schedule surgery for removal.    Regarding hypertension management, patient monitored blood pressure most of October and all of November, usually twice daily. Morning readings ranged from 136 to 146 mmHg systolic. Afternoon/evening readings ranged from 130/70 to 161/70 mmHg, with most readings in the mid-140s to mid-150s systolic. He reports no readings below 120 mmHg systolic.    Patient is currently taking iron supplementation as prescribed and asks about continuing it. He had previously stopped baby aspirin due to bleeding issues and low iron levels.    He reports being locked out of his patient portal repeatedly, requiring multiple calls to the help desk for password resets, but continues to have access issues.    Patient has an upcoming urology appointment in February. His previous urologist Dr. Sedrick left the practice.      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 Everitt  Requires  definitive treatment  Hypertension (I10)  Suboptimal control with current monotherapy  Home monitoring shows readings consistently in 140s-160s systolic  Coronary artery disease present, requiring beta blocker therapy  High calcium  score of 924 indicating significant coronary atherosclerosis  Coronary Artery Disease (I25.10)  High calcium  score of 924  Requires statin therapy for secondary prevention  Patient had previously wanted to discontinue statin  Iron Deficiency Anemia (D50.9)  Currently on iron supplementation  Previous hemoglobin 12.5-12.9 g/dL  History of GI bleeding with aspirin use  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)  Managed with Flomax  and Avodart   Stable symptoms  Urology follow-up scheduled  Patient Portal Access Issues (Z76.89)  Recurrent lockouts requiring technical support  Plan  COVID-19 Management:  Proceed with planned repeat testing  Monitor for symptom development  Hypertension Management:  Initiate beta blocker therapy for dual indication (hypertension and coronary artery disease)  Send prescription for beta blocker  Continue home blood pressure monitoring  Target blood pressure in 110s-120s range  Coronary Artery Disease Management:  Continue statin therapy for secondary prevention given high calcium  score  Explain importance of statin for preventing progression of coronary calcification  Iron Deficiency Management:  Order repeat laboratory studies including hemoglobin and iron studies  Continue iron supplementation pending lab results  If labs normalize, may discontinue iron and reassess  Laboratory Studies:  Order comprehensive labs to reassess hemoglobin and iron status  Recheck A1C (last was 5.6 in October)  Patient can use walk-in labs at [facility]. [place], [facility] on [address],  [address], or ER building in [place] [address]  Aspirin Therapy:  Continue holding baby aspirin due to previous bleeding and low iron  May reconsider after basal cell  carcinoma treatment and hemoglobin stabilization  Preventive Care:  Recommend RSV vaccine (Arexvy) - one-time vaccination available at pharmacy  Patient already up to date on shingles vaccination  Patient Portal Issues:  Advise to call help desk number for technical support  Typically takes about 10 minutes to resolve access issues  Specialist Follow-up:  Urology appointment scheduled for February with Dr. Cherylann (Dr. Jerilynn no longer available)  Awaiting surgical consultation for basal cell carcinoma with Dr. Everitt  Follow-up:  Schedule follow-up appointment in a few months  Review blood pressure response to beta blocker  Review laboratory results  Reassess overall management plan    1. Primary hypertension  Overview:  BP: 137/60     Orders:  -     Comprehensive Metabolic Panel; Future  -     Lipid Panel; Future  -     TSH reflex to FT4; Future  -     Urinalysis; Future  -     Albumin/Creatinine Ratio, Urine; Future  2. Prediabetes  3. Prostate cancer (HCC)  4. Erectile dysfunction, unspecified erectile dysfunction type  5. Hyperlipidemia, unspecified hyperlipidemia type  Overview:  Lab Results   Component Value Date    CHOL 114 03/31/2022    TRIG 67 03/31/2022    HDL 60 03/31/2022    LDL 40.6 03/31/2022    VLDL 13.4 03/31/2022    CHOLHDLRATIO 1.9 03/31/2022       6. BPH associated with nocturia  7. DDD (degenerative disc disease), cervical  8. Coronary artery disease involving native heart without angina pectoris, unspecified vessel or lesion type  -     carvedilol  (COREG ) 3.125 MG tablet; Take 1 tablet by mouth 2 times daily, Disp-60 tablet, R-5Normal  9. Iron deficiency anemia, unspecified iron deficiency anemia type  -     CBC with Auto Differential; Future  -     Iron and TIBC; Future         Current Outpatient Medications   Medication Instructions    amLODIPine  (NORVASC ) 2.5 mg, Oral, DAILY    atorvastatin  (LIPITOR) 20 mg, Oral, DAILY    BIOTIN PO 5,000 mcg, DAILY    carvedilol  (COREG ) 3.125 mg, Oral, 2  TIMES DAILY    cyanocobalamin 100 mcg, DAILY    dutasteride  (AVODART ) 0.5 mg, Oral, DAILY    ferrous sulfate  (IRON 325) 325 mg, Oral, DAILY WITH BREAKFAST    meclizine (ANTIVERT) 12.5 MG tablet PRN    mupirocin  (BACTROBAN ) 2 % ointment Apply topically 3 times daily for 5 days    tadalafil  (CIALIS ) 20 mg, Oral, DAILY PRN    tadalafil  (CIALIS ) 20 mg, Oral, PRN, for erectile dysfunction    tamsulosin  (FLOMAX ) 0.8 mg, Oral, DAILY     There were no vitals filed for this visit.  BP Readings from Last 3 Encounters:   07/10/24 (!) 162/66   06/19/24 130/60   05/29/24 (!) 186/79     There is no height or weight on file to calculate BMI.  Wt Readings from Last 3 Encounters:   07/10/24 77.6 kg (171 lb)   06/19/24 74.8 kg (165 lb)   05/29/24 73 kg (161 lb)         10/09/2024    11:37 AM   PHQ-9    Little interest or pleasure in doing things  0   Feeling down, depressed, or hopeless 0   PHQ-2 Score 0    PHQ-9 Total Score 0        Patient-reported      Physical Exam  Constitutional:       Appearance: Normal appearance.   HENT:      Head: Normocephalic and atraumatic.   Neurological:      Mental Status: He is alert.           Lab Results   Component Value Date    NA 141 07/10/2024    K 4.4 07/10/2024    CL 107 07/10/2024    CO2 27 07/10/2024    BUN 20 07/10/2024    CREATININE 0.99 07/10/2024    GLUCOSE 90 07/10/2024    CALCIUM  9.3 07/10/2024    BILITOT 0.6 07/10/2024    ALKPHOS 104 07/10/2024    AST 32 07/10/2024    ALT 22 07/10/2024    LABGLOM 76 07/10/2024    GFRAA >60 01/26/2021    AGRATIO 1.4 01/26/2021    GLOB 2.9 07/10/2024     Lab Results   Component Value Date    WBC 7.2 07/23/2024    HGB 12.9 (L) 07/23/2024    HCT 40.7 (L) 07/23/2024    MCV 90.0 07/23/2024    PLT 186 07/23/2024    LYMPHOPCT 23.0 07/23/2024    RBC 4.52 07/23/2024    MCH 28.5 07/23/2024    MCHC 31.7 07/23/2024    RDW 13.3 07/23/2024     Lab Results   Component Value Date    CHOL 139 12/29/2023    TRIG 38 12/29/2023    HDL 70 (H) 12/29/2023    LDL 61 12/29/2023     VLDL 8 12/29/2023    CHOLHDLRATIO 2.0 12/29/2023     Hemoglobin A1C   Date Value Ref Range Status   07/10/2024 5.6 0 - 5.6 % Final     Comment:     Reference Range  Normal       <5.7%  Prediabetes  5.7-6.4%  Diabetes     >6.4%     01/26/2021 5.5 4.20 - 6.30 % Final   05/22/2018 5.7 (H) 4.8 - 5.6 % Final     Comment:              Prediabetes: 5.7 - 6.4           Diabetes: >6.4           Glycemic control for adults with diabetes: <7.0       Lab Results   Component Value Date    TSH 1.430 03/31/2022    TSHELE 1.23 12/29/2023         Return in about 3 months (around 01/08/2025) for follow up.    The patient was evaluated through a synchronous (real-time) audio-video encounter. The patient (or guardian if applicable) is aware that this is a billable service, which includes applicable co-pays. This Virtual Visit was conducted with patient's (and/or legal guardian's) consent. Patient identification was verified, and a caregiver was present when appropriate.   The patient was located at Home: [address]  [place] [address]  Provider was located at Home (Appt Dept State): [place]  Confirm you are appropriately licensed, registered, or certified to deliver care in the state where the patient is located as indicated above. If you are not or unsure, please re-schedule the visit: Yes, I confirm.     On this  date 10/10/2024 I have spent 30 minutes reviewing previous notes, test results, and other pertinent medical information with the patient, discussing the diagnosis and importance of compliance with the treatment plan as well as documenting on the day of the visit.    -- Mariam CINDERELLA Fleischer, MD     The patient consented verbally to the use of ambient scribing software to assist with the documentation for this visit.

## 2024-10-10 NOTE — Addendum Note (Signed)
 Addended by: JACOB MARIAM GRADE on: 10/10/2024 01:51 PM     Modules accepted: Level of Service

## 2024-10-14 ENCOUNTER — Encounter

## 2024-10-14 LAB — CBC WITH AUTO DIFFERENTIAL
Basophils %: 0.9 % (ref 0.0–2.0)
Basophils Absolute: 0.07 K/UL (ref 0.00–0.20)
Eosinophils %: 3.6 % (ref 0.5–7.8)
Eosinophils Absolute: 0.27 K/UL (ref 0.00–0.80)
Hematocrit: 41.5 % (ref 41.1–50.3)
Hemoglobin: 13.3 g/dL — ABNORMAL LOW (ref 13.6–17.2)
Immature Granulocytes %: 1.2 % (ref 0.0–5.0)
Immature Granulocytes Absolute: 0.09 K/UL (ref 0.0–0.5)
Lymphocytes %: 19.1 % (ref 13.0–44.0)
Lymphocytes Absolute: 1.44 K/UL (ref 0.50–4.60)
MCH: 28.5 pg (ref 26.1–32.9)
MCHC: 32 g/dL (ref 31.4–35.0)
MCV: 88.9 FL (ref 82–102)
MPV: 9.9 FL (ref 8.8–12.5)
Monocytes %: 8.6 % (ref 4.0–12.0)
Monocytes Absolute: 0.65 K/UL (ref 0.10–1.30)
Neutrophils %: 66.6 % (ref 43.0–78.0)
Neutrophils Absolute: 5.01 K/UL (ref 1.70–8.20)
Platelets: 190 K/uL (ref 150–450)
RBC: 4.67 M/uL (ref 4.23–5.6)
RDW: 13.3 % (ref 11.9–14.6)
WBC: 7.5 K/uL (ref 4.3–11.1)
nRBC: 0 K/uL (ref 0.0–0.2)

## 2024-10-14 LAB — URINALYSIS
Bilirubin, Urine: NEGATIVE
Blood, Urine: NEGATIVE
Glucose, Ur: NEGATIVE mg/dL
Ketones, Urine: NEGATIVE mg/dL
Leukocyte Esterase, Urine: NEGATIVE
Nitrite, Urine: NEGATIVE
Protein, UA: NEGATIVE mg/dL
Specific Gravity, UA: 1.008 (ref 1.001–1.023)
Urobilinogen, Urine: 0.2 EU/dL (ref 0.2–1.0)
pH, Urine: 6 (ref 5.0–9.0)

## 2024-10-14 LAB — COMPREHENSIVE METABOLIC PANEL
ALT: 25 U/L (ref 8–55)
AST: 27 U/L (ref 15–37)
Albumin/Globulin Ratio: 1.3 (ref 1.0–1.9)
Albumin: 3.7 g/dL (ref 3.2–4.6)
Alk Phosphatase: 117 U/L (ref 40–129)
Anion Gap: 8 mmol/L (ref 7–16)
BUN: 15 mg/dL (ref 8–23)
CO2: 28 mmol/L (ref 20–29)
Calcium: 9.2 mg/dL (ref 8.8–10.2)
Chloride: 106 mmol/L (ref 98–107)
Creatinine: 0.86 mg/dL (ref 0.80–1.30)
Est, Glom Filt Rate: 87 ml/min/1.73m2 (ref >60)
Globulin: 2.9 g/dL (ref 2.3–3.5)
Glucose: 106 mg/dL — ABNORMAL HIGH (ref 70–99)
Potassium: 4.3 mmol/L (ref 3.5–5.1)
Sodium: 141 mmol/L (ref 136–145)
Total Bilirubin: 0.5 mg/dL (ref 0.0–1.2)
Total Protein: 6.6 g/dL (ref 6.3–8.2)

## 2024-10-14 LAB — ALBUMIN/CREATININE RATIO, URINE
Albumin Urine: <1.2 mg/dL (ref 0.00–20.00)
Creatinine, Ur: 36.6 mg/dL — ABNORMAL LOW (ref 39.00–259.00)

## 2024-10-14 LAB — IRON AND TIBC
Iron % Saturation: 30 % (ref 20–50)
Iron: 93 ug/dL (ref 35–100)
TIBC: 313 ug/dL (ref 240–450)
UIBC: 220 ug/dL (ref 112.0–347.0)

## 2024-10-14 LAB — LIPID PANEL
Chol/HDL Ratio: 2.3 (ref 0.0–5.0)
Cholesterol, Total: 141 mg/dL (ref 0–200)
HDL: 62 mg/dL — ABNORMAL HIGH (ref 40–60)
LDL Cholesterol: 65 mg/dL (ref 0–100)
Triglycerides: 69 mg/dL (ref 0–150)
VLDL Cholesterol Calculated: 14 mg/dL (ref 6–23)

## 2024-10-14 LAB — TSH REFLEX TO FT4: TSH w Free Thyroid if Abnormal: 2.38 u[IU]/mL (ref 0.27–4.20)

## 2024-10-23 NOTE — Telephone Encounter (Signed)
 Patient needs to contact the office.

## 2024-10-30 ENCOUNTER — Encounter

## 2024-10-30 MED ORDER — ATORVASTATIN CALCIUM 20 MG PO TABS
20 | ORAL_TABLET | Freq: Every day | ORAL | 2 refills | Status: AC
Start: 2024-10-30 — End: ?

## 2024-10-30 NOTE — Telephone Encounter (Signed)
 Requested Prescriptions     Pending Prescriptions Disp Refills    atorvastatin  (LIPITOR) 20 MG tablet [Pharmacy Med Name: ATORVASTATIN  CALCIUM  20 MG Oral Tablet] 90 tablet 2     Sig: TAKE 1 TABLET EVERY DAY     Verified rx. Last OV 06/19/24. Erx to pharm on file.
